# Patient Record
Sex: Male | Born: 1935 | Race: White | Hispanic: No | Marital: Married | Smoking: Never smoker
Health system: Southern US, Community
[De-identification: ages and names within clinical notes are randomized; demographics above are authoritative.]

## PROBLEM LIST (undated history)

## (undated) DIAGNOSIS — I499 Cardiac arrhythmia, unspecified: Secondary | ICD-10-CM

## (undated) DIAGNOSIS — G709 Myoneural disorder, unspecified: Secondary | ICD-10-CM

## (undated) DIAGNOSIS — H269 Unspecified cataract: Secondary | ICD-10-CM

## (undated) DIAGNOSIS — Z85828 Personal history of other malignant neoplasm of skin: Secondary | ICD-10-CM

## (undated) DIAGNOSIS — G4730 Sleep apnea, unspecified: Secondary | ICD-10-CM

## (undated) DIAGNOSIS — Z860100 Personal history of colon polyps, unspecified: Secondary | ICD-10-CM

## (undated) DIAGNOSIS — I10 Essential (primary) hypertension: Secondary | ICD-10-CM

## (undated) DIAGNOSIS — K219 Gastro-esophageal reflux disease without esophagitis: Secondary | ICD-10-CM

## (undated) DIAGNOSIS — I491 Atrial premature depolarization: Secondary | ICD-10-CM

## (undated) DIAGNOSIS — M1990 Unspecified osteoarthritis, unspecified site: Secondary | ICD-10-CM

## (undated) DIAGNOSIS — K635 Polyp of colon: Secondary | ICD-10-CM

## (undated) DIAGNOSIS — T7840XA Allergy, unspecified, initial encounter: Secondary | ICD-10-CM

## (undated) DIAGNOSIS — E785 Hyperlipidemia, unspecified: Secondary | ICD-10-CM

## (undated) DIAGNOSIS — C801 Malignant (primary) neoplasm, unspecified: Secondary | ICD-10-CM

## (undated) DIAGNOSIS — Z8546 Personal history of malignant neoplasm of prostate: Secondary | ICD-10-CM

## (undated) HISTORY — DX: Personal history of colonic polyps: Z86.010

## (undated) HISTORY — DX: Atrial premature depolarization: I49.1

## (undated) HISTORY — PX: ROTATOR CUFF REPAIR: SHX139

## (undated) HISTORY — DX: Sleep apnea, unspecified: G47.30

## (undated) HISTORY — DX: Personal history of malignant neoplasm of prostate: Z85.46

## (undated) HISTORY — PX: COLONOSCOPY: SHX174

## (undated) HISTORY — DX: Unspecified osteoarthritis, unspecified site: M19.90

## (undated) HISTORY — DX: Essential (primary) hypertension: I10

## (undated) HISTORY — DX: Gastro-esophageal reflux disease without esophagitis: K21.9

## (undated) HISTORY — DX: Polyp of colon: K63.5

## (undated) HISTORY — PX: PROSTATE SURGERY: SHX751

## (undated) HISTORY — PX: HEMORROIDECTOMY: SUR656

## (undated) HISTORY — DX: Personal history of colon polyps, unspecified: Z86.0100

## (undated) HISTORY — DX: Allergy, unspecified, initial encounter: T78.40XA

## (undated) HISTORY — DX: Hyperlipidemia, unspecified: E78.5

## (undated) HISTORY — PX: TOTAL KNEE ARTHROPLASTY: SHX125

## (undated) HISTORY — PX: COLONOSCOPY W/ POLYPECTOMY: SHX1380

## (undated) HISTORY — DX: Personal history of other malignant neoplasm of skin: Z85.828

## (undated) HISTORY — PX: INCONTINENCE SURGERY: SHX676

## (undated) HISTORY — DX: Malignant (primary) neoplasm, unspecified: C80.1

## (undated) HISTORY — DX: Unspecified cataract: H26.9

---

## 1995-04-05 HISTORY — PX: PROSTATECTOMY: SHX69

## 2003-07-21 ENCOUNTER — Emergency Department (HOSPITAL_COMMUNITY): Admission: EM | Admit: 2003-07-21 | Discharge: 2003-07-22 | Payer: Self-pay | Admitting: Emergency Medicine

## 2003-12-09 ENCOUNTER — Encounter: Admission: RE | Admit: 2003-12-09 | Discharge: 2003-12-09 | Payer: Self-pay | Admitting: Internal Medicine

## 2004-02-12 ENCOUNTER — Ambulatory Visit: Payer: Self-pay | Admitting: Internal Medicine

## 2004-02-20 ENCOUNTER — Encounter: Admission: RE | Admit: 2004-02-20 | Discharge: 2004-02-20 | Payer: Self-pay | Admitting: Internal Medicine

## 2004-04-30 ENCOUNTER — Ambulatory Visit: Payer: Self-pay | Admitting: Internal Medicine

## 2004-06-24 ENCOUNTER — Ambulatory Visit: Payer: Self-pay | Admitting: Internal Medicine

## 2004-07-13 ENCOUNTER — Ambulatory Visit: Payer: Self-pay | Admitting: Internal Medicine

## 2004-07-21 ENCOUNTER — Ambulatory Visit: Payer: Self-pay | Admitting: Gastroenterology

## 2004-07-27 ENCOUNTER — Ambulatory Visit: Payer: Self-pay | Admitting: *Deleted

## 2004-08-09 ENCOUNTER — Ambulatory Visit: Payer: Self-pay | Admitting: Gastroenterology

## 2005-02-16 ENCOUNTER — Ambulatory Visit: Payer: Self-pay | Admitting: Internal Medicine

## 2005-03-25 ENCOUNTER — Ambulatory Visit: Payer: Self-pay | Admitting: Internal Medicine

## 2005-06-24 ENCOUNTER — Ambulatory Visit: Payer: Self-pay | Admitting: Internal Medicine

## 2005-11-10 ENCOUNTER — Ambulatory Visit: Payer: Self-pay | Admitting: Internal Medicine

## 2006-01-24 ENCOUNTER — Ambulatory Visit: Payer: Self-pay | Admitting: Internal Medicine

## 2006-01-26 ENCOUNTER — Ambulatory Visit: Payer: Self-pay | Admitting: Gastroenterology

## 2006-02-07 ENCOUNTER — Ambulatory Visit: Payer: Self-pay | Admitting: Internal Medicine

## 2006-02-07 LAB — CONVERTED CEMR LAB
ALT: 31 units/L (ref 0–40)
AST: 30 units/L (ref 0–37)
Albumin: 4.1 g/dL (ref 3.5–5.2)
Alkaline Phosphatase: 60 units/L (ref 39–117)
BUN: 13 mg/dL (ref 6–23)
Basophils Absolute: 0 10*3/uL (ref 0.0–0.1)
Basophils Relative: 0.8 % (ref 0.0–1.0)
CO2: 31 meq/L (ref 19–32)
Calcium: 9.4 mg/dL (ref 8.4–10.5)
Chloride: 105 meq/L (ref 96–112)
Chol/HDL Ratio, serum: 3.2
Cholesterol: 209 mg/dL (ref 0–200)
Creatinine, Ser: 0.9 mg/dL (ref 0.4–1.5)
Eosinophil percent: 3.8 % (ref 0.0–5.0)
GFR calc non Af Amer: 89 mL/min
Glomerular Filtration Rate, Af Am: 108 mL/min/{1.73_m2}
Glucose, Bld: 101 mg/dL — ABNORMAL HIGH (ref 70–99)
HCT: 44.2 % (ref 39.0–52.0)
HDL: 65.8 mg/dL (ref >39.0)
Hemoglobin: 15 g/dL (ref 13.0–17.0)
LDL DIRECT: 129.1 mg/dL
Lymphocytes Relative: 27.4 % (ref 12.0–46.0)
MCHC: 34 g/dL (ref 30.0–36.0)
MCV: 89.8 fL (ref 78.0–100.0)
Monocytes Absolute: 0.3 10*3/uL (ref 0.2–0.7)
Monocytes Relative: 7 % (ref 3.0–11.0)
Neutro Abs: 2.9 10*3/uL (ref 1.4–7.7)
Neutrophils Relative %: 61 % (ref 43.0–77.0)
PSA: <0.03 ng/mL — ABNORMAL LOW (ref 0.10–4.00)
Platelets: 262 10*3/uL (ref 150–400)
Potassium: 4.4 meq/L (ref 3.5–5.1)
RBC: 4.92 M/uL (ref 4.22–5.81)
RDW: 12.5 % (ref 11.5–14.6)
Sodium: 142 meq/L (ref 135–145)
TSH: 1.13 microintl units/mL (ref 0.35–5.50)
Total Bilirubin: 1.2 mg/dL (ref 0.3–1.2)
Total Protein: 6.8 g/dL (ref 6.0–8.3)
Triglyceride fasting, serum: 72 mg/dL (ref 0–149)
VLDL: 14 mg/dL (ref 0–40)
WBC: 4.7 10*3/uL (ref 4.5–10.5)

## 2006-02-09 ENCOUNTER — Ambulatory Visit: Payer: Self-pay | Admitting: Gastroenterology

## 2006-02-09 ENCOUNTER — Encounter (INDEPENDENT_AMBULATORY_CARE_PROVIDER_SITE_OTHER): Payer: Self-pay | Admitting: Specialist

## 2006-02-16 ENCOUNTER — Ambulatory Visit: Payer: Self-pay | Admitting: Internal Medicine

## 2006-03-02 ENCOUNTER — Encounter: Payer: Self-pay | Admitting: Internal Medicine

## 2006-06-13 ENCOUNTER — Ambulatory Visit: Payer: Self-pay | Admitting: Internal Medicine

## 2006-06-13 LAB — CONVERTED CEMR LAB
ALT: 32 units/L (ref 0–40)
AST: 28 units/L (ref 0–37)
Cholesterol: 197 mg/dL (ref 0–200)
HDL: 57 mg/dL (ref >39.0)
Hgb A1c MFr Bld: 5.9 % (ref 4.6–6.0)
LDL Cholesterol: 117 mg/dL — ABNORMAL HIGH (ref 0–99)
Total CHOL/HDL Ratio: 3.5
Triglycerides: 114 mg/dL (ref 0–149)
VLDL: 23 mg/dL (ref 0–40)

## 2006-07-07 ENCOUNTER — Ambulatory Visit: Payer: Self-pay | Admitting: Internal Medicine

## 2006-08-09 DIAGNOSIS — M129 Arthropathy, unspecified: Secondary | ICD-10-CM | POA: Insufficient documentation

## 2006-08-10 ENCOUNTER — Encounter: Payer: Self-pay | Admitting: Internal Medicine

## 2006-08-10 ENCOUNTER — Ambulatory Visit: Payer: Self-pay | Admitting: Internal Medicine

## 2006-08-10 LAB — CONVERTED CEMR LAB
BUN: 13 mg/dL (ref 6–23)
Creatinine, Ser: 0.8 mg/dL (ref 0.4–1.5)
Potassium: 3.8 meq/L (ref 3.5–5.1)

## 2006-08-25 ENCOUNTER — Ambulatory Visit: Payer: Self-pay | Admitting: Internal Medicine

## 2006-08-25 ENCOUNTER — Encounter: Payer: Self-pay | Admitting: Internal Medicine

## 2006-08-25 DIAGNOSIS — K219 Gastro-esophageal reflux disease without esophagitis: Secondary | ICD-10-CM | POA: Insufficient documentation

## 2006-10-09 ENCOUNTER — Ambulatory Visit: Payer: Self-pay | Admitting: Internal Medicine

## 2006-10-20 ENCOUNTER — Encounter (INDEPENDENT_AMBULATORY_CARE_PROVIDER_SITE_OTHER): Payer: Self-pay | Admitting: *Deleted

## 2006-10-20 LAB — CONVERTED CEMR LAB
BUN: 12 mg/dL (ref 6–23)
Creatinine, Ser: 0.8 mg/dL (ref 0.4–1.5)
Creatinine,U: 103.4 mg/dL
Hgb A1c MFr Bld: 5.9 % (ref 4.6–6.0)
Microalb Creat Ratio: 3.9 mg/g (ref 0.0–30.0)
Microalb, Ur: 0.4 mg/dL (ref 0.0–1.9)
Potassium: 4.6 meq/L (ref 3.5–5.1)

## 2007-02-05 ENCOUNTER — Telehealth (INDEPENDENT_AMBULATORY_CARE_PROVIDER_SITE_OTHER): Payer: Self-pay | Admitting: *Deleted

## 2007-04-26 ENCOUNTER — Ambulatory Visit: Payer: Self-pay | Admitting: Internal Medicine

## 2007-04-26 ENCOUNTER — Encounter (INDEPENDENT_AMBULATORY_CARE_PROVIDER_SITE_OTHER): Payer: Self-pay | Admitting: Family Medicine

## 2007-04-26 DIAGNOSIS — I491 Atrial premature depolarization: Secondary | ICD-10-CM | POA: Insufficient documentation

## 2007-04-26 DIAGNOSIS — E782 Mixed hyperlipidemia: Secondary | ICD-10-CM | POA: Insufficient documentation

## 2007-04-26 DIAGNOSIS — Z8546 Personal history of malignant neoplasm of prostate: Secondary | ICD-10-CM | POA: Insufficient documentation

## 2007-04-26 DIAGNOSIS — I10 Essential (primary) hypertension: Secondary | ICD-10-CM | POA: Insufficient documentation

## 2007-04-26 LAB — CONVERTED CEMR LAB
Cholesterol, target level: 200 mg/dL
HDL goal, serum: 40 mg/dL
LDL Goal: 100 mg/dL

## 2007-04-28 LAB — CONVERTED CEMR LAB
ALT: 29 units/L (ref 0–53)
AST: 25 units/L (ref 0–37)
Albumin: 4.3 g/dL (ref 3.5–5.2)
Alkaline Phosphatase: 61 units/L (ref 39–117)
BUN: 11 mg/dL (ref 6–23)
Basophils Absolute: 0 10*3/uL (ref 0.0–0.1)
Basophils Relative: 0.7 % (ref 0.0–1.0)
Bilirubin, Direct: 0.2 mg/dL (ref 0.0–0.3)
CO2: 31 meq/L (ref 19–32)
Calcium: 9.5 mg/dL (ref 8.4–10.5)
Chloride: 102 meq/L (ref 96–112)
Cholesterol: 200 mg/dL (ref 0–200)
Creatinine, Ser: 0.8 mg/dL (ref 0.4–1.5)
Eosinophils Absolute: 0.3 10*3/uL (ref 0.0–0.6)
Eosinophils Relative: 5 % (ref 0.0–5.0)
GFR calc Af Amer: 123 mL/min
GFR calc non Af Amer: 102 mL/min
Glucose, Bld: 92 mg/dL (ref 70–99)
HCT: 42.6 % (ref 39.0–52.0)
HDL: 64.2 mg/dL (ref >39.0)
Hemoglobin: 15.1 g/dL (ref 13.0–17.0)
LDL Cholesterol: 114 mg/dL — ABNORMAL HIGH (ref 0–99)
Lymphocytes Relative: 24.1 % (ref 12.0–46.0)
MCHC: 35.4 g/dL (ref 30.0–36.0)
MCV: 87.6 fL (ref 78.0–100.0)
Monocytes Absolute: 0.4 10*3/uL (ref 0.2–0.7)
Monocytes Relative: 6.5 % (ref 3.0–11.0)
Neutro Abs: 3.5 10*3/uL (ref 1.4–7.7)
Neutrophils Relative %: 63.7 % (ref 43.0–77.0)
PSA: 0 ng/mL — ABNORMAL LOW (ref 0.10–4.00)
Platelets: 236 10*3/uL (ref 150–400)
Potassium: 5 meq/L (ref 3.5–5.1)
RBC: 4.86 M/uL (ref 4.22–5.81)
RDW: 12.6 % (ref 11.5–14.6)
Sodium: 139 meq/L (ref 135–145)
TSH: 1.49 microintl units/mL (ref 0.35–5.50)
Total Bilirubin: 1.3 mg/dL — ABNORMAL HIGH (ref 0.3–1.2)
Total CHOL/HDL Ratio: 3.1
Total Protein: 6.9 g/dL (ref 6.0–8.3)
Triglycerides: 111 mg/dL (ref 0–149)
VLDL: 22 mg/dL (ref 0–40)
WBC: 5.5 10*3/uL (ref 4.5–10.5)

## 2007-04-30 ENCOUNTER — Encounter (INDEPENDENT_AMBULATORY_CARE_PROVIDER_SITE_OTHER): Payer: Self-pay | Admitting: *Deleted

## 2007-05-01 ENCOUNTER — Ambulatory Visit: Payer: Self-pay | Admitting: Internal Medicine

## 2007-05-01 ENCOUNTER — Encounter (INDEPENDENT_AMBULATORY_CARE_PROVIDER_SITE_OTHER): Payer: Self-pay | Admitting: *Deleted

## 2007-06-07 ENCOUNTER — Encounter: Payer: Self-pay | Admitting: Internal Medicine

## 2007-10-24 ENCOUNTER — Telehealth (INDEPENDENT_AMBULATORY_CARE_PROVIDER_SITE_OTHER): Payer: Self-pay | Admitting: *Deleted

## 2008-01-08 ENCOUNTER — Telehealth (INDEPENDENT_AMBULATORY_CARE_PROVIDER_SITE_OTHER): Payer: Self-pay | Admitting: *Deleted

## 2008-04-28 ENCOUNTER — Ambulatory Visit: Payer: Self-pay | Admitting: Internal Medicine

## 2008-04-28 DIAGNOSIS — Z85828 Personal history of other malignant neoplasm of skin: Secondary | ICD-10-CM | POA: Insufficient documentation

## 2008-04-28 DIAGNOSIS — R32 Unspecified urinary incontinence: Secondary | ICD-10-CM | POA: Insufficient documentation

## 2008-04-30 ENCOUNTER — Ambulatory Visit: Payer: Self-pay | Admitting: Cardiology

## 2008-05-02 ENCOUNTER — Ambulatory Visit: Payer: Self-pay | Admitting: Internal Medicine

## 2008-05-02 LAB — CONVERTED CEMR LAB
OCCULT 1: NEGATIVE
OCCULT 2: NEGATIVE
OCCULT 3: NEGATIVE

## 2008-05-06 ENCOUNTER — Encounter: Payer: Self-pay | Admitting: Internal Medicine

## 2008-05-09 ENCOUNTER — Encounter: Payer: Self-pay | Admitting: Internal Medicine

## 2008-06-19 ENCOUNTER — Ambulatory Visit: Payer: Self-pay | Admitting: Internal Medicine

## 2009-03-02 ENCOUNTER — Encounter: Payer: Self-pay | Admitting: Internal Medicine

## 2009-04-29 ENCOUNTER — Ambulatory Visit: Payer: Self-pay | Admitting: Internal Medicine

## 2009-05-05 ENCOUNTER — Telehealth (INDEPENDENT_AMBULATORY_CARE_PROVIDER_SITE_OTHER): Payer: Self-pay | Admitting: *Deleted

## 2009-05-06 ENCOUNTER — Ambulatory Visit: Payer: Self-pay | Admitting: Internal Medicine

## 2009-05-06 LAB — CONVERTED CEMR LAB
OCCULT 1: NEGATIVE
OCCULT 2: NEGATIVE
OCCULT 3: NEGATIVE

## 2009-05-07 ENCOUNTER — Encounter (INDEPENDENT_AMBULATORY_CARE_PROVIDER_SITE_OTHER): Payer: Self-pay | Admitting: *Deleted

## 2010-01-20 ENCOUNTER — Telehealth (INDEPENDENT_AMBULATORY_CARE_PROVIDER_SITE_OTHER): Payer: Self-pay | Admitting: *Deleted

## 2010-04-08 ENCOUNTER — Telehealth: Payer: Self-pay | Admitting: Cardiology

## 2010-04-08 ENCOUNTER — Telehealth: Payer: Self-pay | Admitting: Internal Medicine

## 2010-04-08 ENCOUNTER — Ambulatory Visit
Admission: RE | Admit: 2010-04-08 | Discharge: 2010-04-08 | Payer: Self-pay | Source: Home / Self Care | Attending: Internal Medicine | Admitting: Internal Medicine

## 2010-04-08 DIAGNOSIS — M5412 Radiculopathy, cervical region: Secondary | ICD-10-CM | POA: Insufficient documentation

## 2010-04-08 DIAGNOSIS — G5600 Carpal tunnel syndrome, unspecified upper limb: Secondary | ICD-10-CM | POA: Insufficient documentation

## 2010-04-30 ENCOUNTER — Ambulatory Visit
Admission: RE | Admit: 2010-04-30 | Discharge: 2010-04-30 | Payer: Self-pay | Source: Home / Self Care | Attending: Internal Medicine | Admitting: Internal Medicine

## 2010-04-30 ENCOUNTER — Other Ambulatory Visit: Payer: Self-pay | Admitting: Internal Medicine

## 2010-04-30 ENCOUNTER — Encounter: Payer: Self-pay | Admitting: Internal Medicine

## 2010-04-30 DIAGNOSIS — Z860100 Personal history of colon polyps, unspecified: Secondary | ICD-10-CM | POA: Insufficient documentation

## 2010-04-30 DIAGNOSIS — J069 Acute upper respiratory infection, unspecified: Secondary | ICD-10-CM | POA: Insufficient documentation

## 2010-04-30 LAB — HEPATIC FUNCTION PANEL
ALT: 32 U/L (ref 0–53)
AST: 33 U/L (ref 0–37)
Albumin: 4.2 g/dL (ref 3.5–5.2)
Alkaline Phosphatase: 60 U/L (ref 39–117)
Bilirubin, Direct: 0.1 mg/dL (ref 0.0–0.3)
Total Bilirubin: 1.1 mg/dL (ref 0.3–1.2)
Total Protein: 6.8 g/dL (ref 6.0–8.3)

## 2010-04-30 LAB — CBC WITH DIFFERENTIAL/PLATELET
Basophils Absolute: 0 10*3/uL (ref 0.0–0.1)
Basophils Relative: 0.4 % (ref 0.0–3.0)
Eosinophils Absolute: 0.3 10*3/uL (ref 0.0–0.7)
Eosinophils Relative: 4.3 % (ref 0.0–5.0)
HCT: 41.1 % (ref 39.0–52.0)
Hemoglobin: 14.4 g/dL (ref 13.0–17.0)
Lymphocytes Relative: 24.8 % (ref 12.0–46.0)
Lymphs Abs: 1.6 10*3/uL (ref 0.7–4.0)
MCHC: 35 g/dL (ref 30.0–36.0)
MCV: 89.7 fl (ref 78.0–100.0)
Monocytes Absolute: 0.5 10*3/uL (ref 0.1–1.0)
Monocytes Relative: 7.2 % (ref 3.0–12.0)
Neutro Abs: 4.1 10*3/uL (ref 1.4–7.7)
Neutrophils Relative %: 63.3 % (ref 43.0–77.0)
Platelets: 267 10*3/uL (ref 150.0–400.0)
RBC: 4.59 Mil/uL (ref 4.22–5.81)
RDW: 13.6 % (ref 11.5–14.6)
WBC: 6.5 10*3/uL (ref 4.5–10.5)

## 2010-04-30 LAB — CONVERTED CEMR LAB
Bilirubin Urine: NEGATIVE
Glucose, Urine, Semiquant: NEGATIVE
Ketones, urine, test strip: NEGATIVE
Nitrite: NEGATIVE
Protein, U semiquant: NEGATIVE
Specific Gravity, Urine: <1.005
Urobilinogen, UA: NEGATIVE
WBC Urine, dipstick: NEGATIVE
pH: 5.5

## 2010-04-30 LAB — TSH: TSH: 1.67 u[IU]/mL (ref 0.35–5.50)

## 2010-04-30 LAB — BASIC METABOLIC PANEL
BUN: 17 mg/dL (ref 6–23)
CO2: 27 mEq/L (ref 19–32)
Calcium: 9.2 mg/dL (ref 8.4–10.5)
Chloride: 106 mEq/L (ref 96–112)
Creatinine, Ser: 0.7 mg/dL (ref 0.4–1.5)
GFR: 111.64 mL/min (ref >60.00)
Glucose, Bld: 87 mg/dL (ref 70–99)
Potassium: 4.2 mEq/L (ref 3.5–5.1)
Sodium: 141 mEq/L (ref 135–145)

## 2010-04-30 LAB — LIPID PANEL
Cholesterol: 209 mg/dL — ABNORMAL HIGH (ref 0–200)
HDL: 66.1 mg/dL (ref >39.00)
Total CHOL/HDL Ratio: 3
Triglycerides: 119 mg/dL (ref 0.0–149.0)
VLDL: 23.8 mg/dL (ref 0.0–40.0)

## 2010-04-30 LAB — LDL CHOLESTEROL, DIRECT: Direct LDL: 132.3 mg/dL

## 2010-04-30 LAB — PSA: PSA: 0 ng/mL — ABNORMAL LOW (ref 0.10–4.00)

## 2010-05-02 LAB — CONVERTED CEMR LAB
ALT: 25 units/L (ref 0–53)
ALT: 42 units/L (ref 0–53)
AST: 24 units/L (ref 0–37)
AST: 35 units/L (ref 0–37)
Albumin: 4.3 g/dL (ref 3.5–5.2)
Albumin: 4.3 g/dL (ref 3.5–5.2)
Alkaline Phosphatase: 62 units/L (ref 39–117)
Alkaline Phosphatase: 64 units/L (ref 39–117)
BUN: 14 mg/dL (ref 6–23)
BUN: 15 mg/dL (ref 6–23)
Basophils Absolute: 0 10*3/uL (ref 0.0–0.1)
Basophils Absolute: 0 10*3/uL (ref 0.0–0.1)
Basophils Relative: 0.3 % (ref 0.0–3.0)
Basophils Relative: 0.3 % (ref 0.0–3.0)
Bilirubin, Direct: 0.1 mg/dL (ref 0.0–0.3)
Bilirubin, Direct: 0.2 mg/dL (ref 0.0–0.3)
CO2: 30 meq/L (ref 19–32)
CO2: 31 meq/L (ref 19–32)
Calcium: 9.1 mg/dL (ref 8.4–10.5)
Calcium: 9.2 mg/dL (ref 8.4–10.5)
Chloride: 105 meq/L (ref 96–112)
Chloride: 105 meq/L (ref 96–112)
Cholesterol: 178 mg/dL (ref 0–200)
Cholesterol: 195 mg/dL (ref 0–200)
Creatinine, Ser: 0.8 mg/dL (ref 0.4–1.5)
Creatinine, Ser: 0.8 mg/dL (ref 0.4–1.5)
Eosinophils Absolute: 0.2 10*3/uL (ref 0.0–0.7)
Eosinophils Absolute: 0.3 10*3/uL (ref 0.0–0.7)
Eosinophils Relative: 4 % (ref 0.0–5.0)
Eosinophils Relative: 4.1 % (ref 0.0–5.0)
GFR calc Af Amer: 123 mL/min
GFR calc non Af Amer: 100.73 mL/min (ref >60)
GFR calc non Af Amer: 101 mL/min
Glucose, Bld: 78 mg/dL (ref 70–99)
Glucose, Bld: 94 mg/dL (ref 70–99)
HCT: 43 % (ref 39.0–52.0)
HCT: 44.9 % (ref 39.0–52.0)
HDL: 62.1 mg/dL (ref >39.00)
HDL: 64.1 mg/dL (ref >39.0)
Hemoglobin: 14.5 g/dL (ref 13.0–17.0)
Hemoglobin: 15.6 g/dL (ref 13.0–17.0)
LDL Cholesterol: 100 mg/dL — ABNORMAL HIGH (ref 0–99)
LDL Cholesterol: 110 mg/dL — ABNORMAL HIGH (ref 0–99)
LDL Goal: 130 mg/dL
Lymphocytes Relative: 19.3 % (ref 12.0–46.0)
Lymphocytes Relative: 29.8 % (ref 12.0–46.0)
Lymphs Abs: 1.5 10*3/uL (ref 0.7–4.0)
MCHC: 33.7 g/dL (ref 30.0–36.0)
MCHC: 34.7 g/dL (ref 30.0–36.0)
MCV: 89.3 fL (ref 78.0–100.0)
MCV: 91.9 fL (ref 78.0–100.0)
Monocytes Absolute: 0.3 10*3/uL (ref 0.1–1.0)
Monocytes Absolute: 0.4 10*3/uL (ref 0.1–1.0)
Monocytes Relative: 6 % (ref 3.0–12.0)
Monocytes Relative: 6.2 % (ref 3.0–12.0)
Neutro Abs: 3.1 10*3/uL (ref 1.4–7.7)
Neutro Abs: 4.5 10*3/uL (ref 1.4–7.7)
Neutrophils Relative %: 59.6 % (ref 43.0–77.0)
Neutrophils Relative %: 70.4 % (ref 43.0–77.0)
PSA: 0 ng/mL — ABNORMAL LOW (ref 0.10–4.00)
Platelets: 228 10*3/uL (ref 150.0–400.0)
Platelets: 240 10*3/uL (ref 150–400)
Potassium: 4.1 meq/L (ref 3.5–5.1)
Potassium: 4.6 meq/L (ref 3.5–5.1)
RBC: 4.68 M/uL (ref 4.22–5.81)
RBC: 5.02 M/uL (ref 4.22–5.81)
RDW: 12.7 % (ref 11.5–14.6)
RDW: 13.2 % (ref 11.5–14.6)
Sodium: 142 meq/L (ref 135–145)
Sodium: 142 meq/L (ref 135–145)
TSH: 1.58 microintl units/mL (ref 0.35–5.50)
TSH: 1.65 microintl units/mL (ref 0.35–5.50)
Total Bilirubin: 1.2 mg/dL (ref 0.3–1.2)
Total Bilirubin: 1.2 mg/dL (ref 0.3–1.2)
Total CHOL/HDL Ratio: 3
Total CHOL/HDL Ratio: 3
Total Protein: 6.8 g/dL (ref 6.0–8.3)
Total Protein: 7 g/dL (ref 6.0–8.3)
Triglycerides: 105 mg/dL (ref 0–149)
Triglycerides: 81 mg/dL (ref 0.0–149.0)
VLDL: 16.2 mg/dL (ref 0.0–40.0)
VLDL: 21 mg/dL (ref 0–40)
WBC: 5.1 10*3/uL (ref 4.5–10.5)
WBC: 6.5 10*3/uL (ref 4.5–10.5)

## 2010-05-04 NOTE — Progress Notes (Signed)
Summary: CPX and surgery  Phone Note Call from Patient Call back at Home Phone [phone]   Summary of Call: Patient called the office stating that someone called him. I tried to establish what the pt needed and as of this time he does not need anything. Patient is currently scheduled for a CPX on 04/29/10. He will leave his cpx as scheduled and try to get his surgery moved to the first of Feb. Initial call taken by: Lucious Groves CMA,  January 20, 2010 2:41 PM  Follow-up for Phone Call        I dont show any phone message placed to this patient, therefore I will close out this phone note Follow-up by: Jerolyn Shin,  January 26, 2010 10:44 AM

## 2010-05-04 NOTE — Progress Notes (Signed)
Summary: sinus infection want antibiotics  Phone Note Call from Patient   Caller: Patient Summary of Call: pt left VM that he thinks that he may have a sinus infection and would like to have med called in to [facility]. called pt back informed pt that he would need to been seen for a antibiotics to be rx. pt states that he will continue to use neti pot and if his symptoms increase he will call for OV.............Marland KitchenFelecia Deloach CMA  May 05, 2009 1:16 PM
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
## 2010-05-04 NOTE — Letter (Signed)
Summary: Results Follow up Letter  [facility]  [address] [place], [place] [address]   Phone: [phone]  Fax: [phone]    05/07/2009 MRN: [id number]  [name] [address] [place], [place]  [address]  Dear [name],  The following are the results of your recent test(s):  Test         Result    Pap Smear:        Normal _____  Not Normal _____ Comments: ______________________________________________________ Cholesterol: LDL(Bad cholesterol):         Your goal is less than:         HDL (Good cholesterol):       Your goal is more than: Comments:  ______________________________________________________ Mammogram:        Normal _____  Not Normal _____ Comments:  ___________________________________________________________________ Hemoccult:        Normal _X____  Not normal _______ Comments:    _____________________________________________________________________ Other Tests:    We routinely do not discuss normal results over the telephone.  If you desire a copy of the results, or you have any questions about this information we can discuss them at your next office visit.   Sincerely,

## 2010-05-06 ENCOUNTER — Encounter (HOSPITAL_COMMUNITY): Payer: Medicare Other | Attending: Orthopedic Surgery

## 2010-05-06 LAB — COMPREHENSIVE METABOLIC PANEL
AST: 28 U/L (ref 0–37)
Albumin: 4.1 g/dL (ref 3.5–5.2)
BUN: 14 mg/dL (ref 6–23)
CO2: 28 mEq/L (ref 19–32)
Calcium: 9.1 mg/dL (ref 8.4–10.5)
Chloride: 103 mEq/L (ref 96–112)
Creatinine, Ser: 0.83 mg/dL (ref 0.4–1.5)
GFR calc Af Amer: >60 mL/min (ref >60)
GFR calc non Af Amer: >60 mL/min (ref >60)
Total Bilirubin: 0.9 mg/dL (ref 0.3–1.2)

## 2010-05-06 LAB — CBC
HCT: 41.6 % (ref 39.0–52.0)
Hemoglobin: 14.2 g/dL (ref 13.0–17.0)
MCH: 30.1 pg (ref 26.0–34.0)
MCHC: 34.1 g/dL (ref 30.0–36.0)
MCV: 88.1 fL (ref 78.0–100.0)
Platelets: 243 10*3/uL (ref 150–400)
RBC: 4.72 MIL/uL (ref 4.22–5.81)
RDW: 13.3 % (ref 11.5–15.5)
WBC: 7.4 10*3/uL (ref 4.0–10.5)

## 2010-05-06 LAB — PROTIME-INR
INR: 0.97 (ref 0.00–1.49)
Prothrombin Time: 13.1 seconds (ref 11.6–15.2)

## 2010-05-06 LAB — URINALYSIS, ROUTINE W REFLEX MICROSCOPIC
Bilirubin Urine: NEGATIVE
Hgb urine dipstick: NEGATIVE
Ketones, ur: NEGATIVE mg/dL
Nitrite: NEGATIVE
Protein, ur: NEGATIVE mg/dL
Specific Gravity, Urine: 1.019 (ref 1.005–1.030)
Urine Glucose, Fasting: NEGATIVE mg/dL
Urobilinogen, UA: 0.2 mg/dL (ref 0.0–1.0)
pH: 5 (ref 5.0–8.0)

## 2010-05-06 LAB — SURGICAL PCR SCREEN
MRSA, PCR: NEGATIVE
Staphylococcus aureus: NEGATIVE

## 2010-05-06 LAB — APTT: aPTT: 28 seconds (ref 24–37)
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
## 2010-05-06 NOTE — Assessment & Plan Note (Signed)
Summary: right arm   Vital Signs:  Patient profile:   76 year old male Weight:      194.8 pounds BMI:     28.05 Temp:     98.5 degrees F oral Pulse rate:   56 / minute Resp:     15 per minute BP sitting:   130 / 80  (left arm) Cuff size:   large  Vitals Entered By: Shonna Chock CMA (April 08, 2010 4:37 PM) CC: Right arm concerns since yesterday , Shoulder pain   CC:  Right arm concerns since yesterday  and Shoulder pain.  History of Present Illness:      This is a 76 year old man who presents with Shoulder pain; onset while reclining watching tv last night. His neck was flexed for 2 hrs. The patient reports numbness and tingling in entire RUE ( no definite nerve root distribution) last night in bed, but denies weakness, locking, stiffness, impaired ROM, swelling, and redness.  The pain is located in the right shoulder.  The pain began gradually and  was ? related to using machines @ gym 01/03.  The patient describes the pain as constant and aching.  The pain  was no  better with ASA, it has improved through day today. PMH of Rotator Cuff surgery on both shoulders.He is on Celebrex once daily for knee pain; TKR planned by Dr Despina Hick 05/17/2010.  Current Medications (verified): 1)  Clarinex 5 Mg  Tabs (Desloratadine) .... As Needed 2)  Clonazepam 0.5 Mg  Tabs (Clonazepam) .Marland Kitchen.. 1 Tab Once Daily Prn 3)  Lipitor 20 Mg  Tabs (Atorvastatin Calcium) .... Take  1 1/2  Tablets  Daily 4)  Skelaxin 800 Mg  Tabs (Metaxalone) .... As Needed 5)  Metoprolol Tartrate 50 Mg  Tabs (Metoprolol Tartrate) .... Take One Half Tablet Twice Daily 6)  Celebrex 200 Mg  Caps (Celecoxib) .... Take One Tablet Daily 7)  Amlodipine Besylate 10 Mg  Tabs (Amlodipine Besylate) .Marland Kitchen.. 1 Once Daily 8)  Benazepril Hcl 40 Mg  Tabs (Benazepril Hcl) .Marland Kitchen.. 1 By Mouth Qd 9)  Prilosec Otc 20 Mg Tbec (Omeprazole Magnesium) .Marland Kitchen.. 1 Q Am As Needed  Allergies: 1)  ! * Topical Cains  Physical Exam  Neck:  No deformities, masses,  or tenderness noted. Full ROM Msk:  No deformity or scoliosis noted of thoracic or lumbar spine.  Minimal lordosis upper spine. Slight asymmetry of thoracic muscles, R> L Extremities:  Minimally decreased ROM R shoulder; crepitus L shoulder > R Cervical Nodes:  No lymphadenopathy noted Axillary Nodes:  No palpable lymphadenopathy Psych:  memory intact for recent and remote, normally interactive, and good eye contact.     Shoulder/Elbow Exam  General:    Well-developed, well-nourished, normal body habitus; no deformities, normal grooming.    Skin:    Intact, no scars, lesions, rashes, cafe au lait spots or bruising.    Inspection:    Inspection is normal.    Palpation:    Non-tender to palpation bilaterally.    Vascular:    Radial  pulses 2+ and symmetric; ; no evidence of ischemia, clubbing, or cyanosis.    Sensory:    Gross sensation intact in the upper extremities.    Motor:    Normal strength in the upper extremities.    Reflexes:     1/2+  reflexes in the upper extremities.  Negative Tinel's    Impression & Recommendations:  Problem # 1:  CERVICAL RADICULOPATHY, RIGHT (ICD-723.4) probably  due to protracted neck flexion while watching  tv  Problem # 2:  CARPAL TUNNEL SYNDROME, RIGHT, MILD (ICD-354.0) from wrist positioning while asleep  Complete Medication List: 1)  Clarinex 5 Mg Tabs (Desloratadine) .... As needed 2)  Clonazepam 0.5 Mg Tabs (Clonazepam) .Marland Kitchen.. 1 tab once daily prn 3)  Lipitor 20 Mg Tabs (Atorvastatin calcium) .... Take  1 1/2  tablets  daily 4)  Skelaxin 800 Mg Tabs (Metaxalone) .... As needed 5)  Metoprolol Tartrate 50 Mg Tabs (Metoprolol tartrate) .... Take one half tablet twice daily 6)  Celebrex 200 Mg Caps (Celecoxib) .... Take one tablet daily 7)  Amlodipine Besylate 10 Mg Tabs (Amlodipine besylate) .Marland Kitchen.. 1 once daily 8)  Benazepril Hcl 40 Mg Tabs (Benazepril hcl) .Marland Kitchen.. 1 by mouth qd 9)  Prilosec Otc 20 Mg Tbec (Omeprazole magnesium) .Marland Kitchen.. 1  q am as needed  Patient Instructions: 1)  Avoid neck flexion; consider a cervical pillow. Wrist splint if numbness recurs. Skelaxin as needed as Rxed.   Orders Added: 1)  Est. Patient Level IV [47829]

## 2010-05-06 NOTE — Progress Notes (Signed)
Summary: Right arm throbbing  Phone Note Call from Patient   Summary of Call: Pt called today requesting appt for issue he is having with his right arm. He notes that he has been having tingling and throbbing of the right arm only. He denies chest pain, SOB, fever, and swelling. Patient did not want to go to ER or UC, so he will come here this afternoon. Lucious Groves CMA  April 08, 2010 9:06 AM

## 2010-05-06 NOTE — Progress Notes (Signed)
Summary: throbbing in right arm-wants to come in now  Phone Note Call from Patient   Caller: Patient [phone]   Reason for Call: Talk to Nurse Summary of Call: pt having throbbing in right arm since last night-denies any other symptoms-wants to come in right now Initial call taken by: Glynda Jaeger,  April 08, 2010 8:06 AM  Follow-up for Phone Call        lmom for pt that he needs to call Dr Caryl Never office  hard to say what the throbbing pain is in right arm.  pt has not been seen by Dr Myrtis Ser in almost two years.  i think he should start with his primary care doctor first.   Dennis Bast, RN, BSN  April 08, 2010 8:59 AM   Additional Follow-up for Phone Call Additional follow up Details #1::        Agree   Talitha Givens, MD, [facility]  April 08, 2010 9:03 AM

## 2010-05-12 ENCOUNTER — Encounter (INDEPENDENT_AMBULATORY_CARE_PROVIDER_SITE_OTHER): Payer: Self-pay | Admitting: *Deleted

## 2010-05-12 ENCOUNTER — Other Ambulatory Visit (INDEPENDENT_AMBULATORY_CARE_PROVIDER_SITE_OTHER): Payer: Medicare Other

## 2010-05-12 ENCOUNTER — Encounter: Payer: Self-pay | Admitting: Internal Medicine

## 2010-05-12 DIAGNOSIS — Z1211 Encounter for screening for malignant neoplasm of colon: Secondary | ICD-10-CM

## 2010-05-12 LAB — CONVERTED CEMR LAB
OCCULT 1: NEGATIVE
OCCULT 2: NEGATIVE
OCCULT 3: NEGATIVE

## 2010-05-17 ENCOUNTER — Inpatient Hospital Stay (HOSPITAL_COMMUNITY)
Admission: RE | Admit: 2010-05-17 | Discharge: 2010-05-20 | DRG: 470 | Disposition: A | Discharge status: Home Health | Payer: Medicare Other | Attending: Orthopedic Surgery | Admitting: Orthopedic Surgery

## 2010-05-17 DIAGNOSIS — M1710 Unilateral primary osteoarthritis, unspecified knee: Principal | ICD-10-CM | POA: Diagnosis present

## 2010-05-17 DIAGNOSIS — Z8546 Personal history of malignant neoplasm of prostate: Secondary | ICD-10-CM

## 2010-05-17 DIAGNOSIS — E7800 Pure hypercholesterolemia, unspecified: Secondary | ICD-10-CM | POA: Diagnosis present

## 2010-05-17 DIAGNOSIS — G4733 Obstructive sleep apnea (adult) (pediatric): Secondary | ICD-10-CM | POA: Diagnosis present

## 2010-05-17 DIAGNOSIS — I10 Essential (primary) hypertension: Secondary | ICD-10-CM | POA: Diagnosis present

## 2010-05-17 DIAGNOSIS — I491 Atrial premature depolarization: Secondary | ICD-10-CM | POA: Diagnosis present

## 2010-05-17 DIAGNOSIS — M21869 Other specified acquired deformities of unspecified lower leg: Secondary | ICD-10-CM | POA: Diagnosis present

## 2010-05-17 LAB — TYPE AND SCREEN: Antibody Screen: NEGATIVE

## 2010-05-17 LAB — ABO/RH: ABO/RH(D): O POS

## 2010-05-18 LAB — CBC
Hemoglobin: 11.9 g/dL — ABNORMAL LOW (ref 13.0–17.0)
MCH: 30 pg (ref 26.0–34.0)
MCHC: 34.4 g/dL (ref 30.0–36.0)
Platelets: 213 10*3/uL (ref 150–400)

## 2010-05-18 LAB — BASIC METABOLIC PANEL
CO2: 30 mEq/L (ref 19–32)
Calcium: 8.4 mg/dL (ref 8.4–10.5)
Creatinine, Ser: 0.79 mg/dL (ref 0.4–1.5)
GFR calc Af Amer: >60 mL/min (ref >60)
GFR calc non Af Amer: >60 mL/min (ref >60)
Sodium: 139 mEq/L (ref 135–145)

## 2010-05-19 LAB — BASIC METABOLIC PANEL
CO2: 29 mEq/L (ref 19–32)
Chloride: 105 mEq/L (ref 96–112)
Creatinine, Ser: 0.82 mg/dL (ref 0.4–1.5)
GFR calc Af Amer: >60 mL/min (ref >60)
Potassium: 3.8 mEq/L (ref 3.5–5.1)
Sodium: 140 mEq/L (ref 135–145)

## 2010-05-19 LAB — CBC
Hemoglobin: 10.6 g/dL — ABNORMAL LOW (ref 13.0–17.0)
Platelets: 194 10*3/uL (ref 150–400)
RBC: 3.57 MIL/uL — ABNORMAL LOW (ref 4.22–5.81)
WBC: 9 10*3/uL (ref 4.0–10.5)

## 2010-05-19 NOTE — H&P (Signed)
NAMENECO, KLING NO.:  [id number]  MEDICAL RECORD NO.:  [id number]           PATIENT TYPE:  LOCATION:                                 FACILITY:  PHYSICIAN:  Ollen Gross, M.D.    DATE OF BIRTH:  November 05, 1934  DATE OF ADMISSION:  05/17/2010 DATE OF DISCHARGE:                             HISTORY & PHYSICAL   DATE OF OFFICE VISIT AND HISTORY AND PHYSICAL:  April 13, 2010.  DATE OF ADMISSION:  May 17, 2010  CHIEF COMPLAINT:  Left knee pain.  HISTORY OF PRESENT ILLNESS:  The patient is a 75-year male who has seen by Dr. Lequita Halt for ongoing left knee pain.  He has originally been a patient of Dr. Hayden Rasmussen and has been treated for his knee for several years now.  He was noted to have progressive arthritis.  X-rays showed essentially bone-on-bone in the medial compartment as well as slight valgus malalignment deformity.  Also has some patellofemoral involvement.  He has undergone injections in the past with only minimal benefit.  He would like to get back to being active and playing golf, would like to go ahead and have his knee replaced in order to resume his active lifestyle.  ALLERGIES:  No known drug allergies.  CURRENT MEDICATIONS:  Lipitor 30 mg daily, benazepril 40 mg daily, metoprolol 50 mg 1/2 tablet twice a day, amlodipine 10 mg daily.  PAST MEDICAL HISTORY:  History of shingles, mild sleep apnea, hypertension, hyperlipidemia, history of PACs, hemorrhoids, gastroesophageal reflux disease, history of prostate cancer, urinary incontinence, history of skin cancer, osteoarthritis.  Childhood illnesses of measles and mumps.  PAST SURGICAL HISTORY:  Prostate surgery in 1997, left shoulder surgery, right shoulder surgery, left knee scope.  FAMILY HISTORY:  Father deceased at age 92.  Mother with history of stroke.  SOCIAL HISTORY:  Married.  Retired from [employer] and also [employer]. Nonsmoker, 2 to 3 ounces of alcohol daily.  Has 3 children.   He does have a caregiver lined up.  Lives in [facility].  He has a living will.  REVIEW OF SYSTEMS:  GENERAL:  No fevers, chills, or night sweats. NEURO:  No seizure, syncope, paralysis.  RESPIRATORY: No shortness breath, productive cough, or hemoptysis.  CARDIOVASCULAR:  No chest pain, no orthopnea.  GI:  No nausea, vomiting, diarrhea, constipation. Little bit of reflux.  GU:  Little bit of frequency, nocturia and some slight incontinence.  MUSCULOSKELETAL:  Knee pain.  PHYSICAL EXAMINATION:  VITAL SIGNS:  Pulse 64 which is irregular, respirations 12, blood pressure 117/70. GENERAL:  A 76 year old male, well nourished, well developed, no acute distress.  He is alert, oriented, cooperative, good historian. HEENT:  Normocephalic, atraumatic.  Pupils are round and reactive.  EOMs intact. NECK:  Supple. CHEST:  Clear. HEART:  Irregular rhythm (PACs).  S1 and S2 noted.  He has a very faint systolic ejection murmur best heard over aortic, slightly more than pulmonic. ABDOMEN:  Soft, nontender.  Bowel sounds present. RECTAL:  Not done, not pertinent to present illness. BREASTS:  Not done, not pertinent to present illness.  GENITALIA:  Not done, not pertinent to present illness. EXTREMITIES:  Left knee, no effusion, slight varus.  Range of motion 5 to 120.  Moderate-to-marked crepitus.  IMPRESSION:  Osteoarthritis, left knee.  PLAN:  The patient admitted to [facility] to undergo a left total knee replacement arthroplasty.  Surgery will be performed by Dr. Ollen Gross.     Alexzandrew L. Julien Girt, P.A.C.   ______________________________ Ollen Gross, M.D.    ALP/MEDQ  D:  05/16/2010  T:  05/17/2010  Job:  [id number]  cc:   Titus Dubin. Alwyn Ren, MD,FACP,FCCP [phone] [place] [address]  Luis Abed, MD, [facility] [address]  [address] [place] [address]  Electronically Signed by Patrica Duel P.A.C. on 05/19/2010 11:22:46  AM Electronically Signed by Ollen Gross M.D. on 05/19/2010 02:55:16 PM

## 2010-05-19 NOTE — Op Note (Signed)
[name], MCKENZIE NO.:  [id number]  MEDICAL RECORD NO.:  [id number]           PATIENT TYPE:  I  LOCATION:  0003                         FACILITY:  [facility]  PHYSICIAN:  Ollen Gross, M.D.    DATE OF BIRTH:  [birth year]  DATE OF PROCEDURE:  05/17/2010 DATE OF DISCHARGE:                              OPERATIVE REPORT   PREOPERATIVE DIAGNOSIS:  Osteoarthritis, left knee.  POSTOPERATIVE DIAGNOSIS:  Osteoarthritis, left knee.  PROCEDURE:  Left total knee arthroplasty.  SURGEON:  Ollen Gross, M.D.  ASSISTANT:  Rozell Searing, PA-C  ANESTHESIA:  General.  ESTIMATED BLOOD LOSS:  Minimal.  DRAIN:  Hemovac x1.  TOURNIQUET TIME:  39 minutes at 300 mmHg.  COMPLICATIONS:  None.  CONDITION:  Stable to recovery.  CLINICAL NOTE:  [name] is a 76 year old male with end-stage arthritis of left knee with progressively worsening pain and dysfunction.  He has failed nonoperative management, presents for left total knee arthroplasty.  PROCEDURE IN DETAIL:  After successful initiation of general anesthetic, a tourniquet was placed on his left thigh and his left lower extremity was prepped and draped in the usual sterile fashion.  Extremities were wrapped in Esmarch, knee flexed, tourniquet inflated to 300 mmHg. Midline incision was made with a #10 blade through subcutaneous tissue to the level of the extensor mechanism.  Fresh blade was used make a medial parapatellar arthrotomy.  Soft tissue on the proximal medial tibia subperiosteally elevated to the joint line with the knife into the semimembranosus bursa with Cobb elevator.  Soft tissue laterally was elevated with attention being paid to avoid any patellar tendon on tibial tubercle.  Patella was everted, knee flexed 90 degrees, ACL and PCL removed.  Drill was used create a starting hole in the distal femur and canal was thoroughly irrigated.  A 5-degree left valgus alignment guide was placed and the distal  femoral cutting block was pinned to remove 11 mm off the distal femur.  Distal femoral resection was made with an oscillating saw.  Sizing guide was placed, size 5 was the most appropriate.  Rotations marked at the epicondylar axis.  Size 5 cutting guide was placed and the anterior and posterior chamfer cuts were made.  The tibia subluxed forward and the menisci removed.  Extramedullary tibial alignment guide was placed referencing proximally at the medial aspect of the tibial tubercle and distally along the second metatarsal axis and tibial crest.  The block was pinned to remove 2 mm off the more deficient medial side.  The resection was made with an oscillating saw. Size 5 was the most appropriate tibial component and proximal tibia was prepared to modular drill and keel punch for the size 5.  Femoral preparation was completed with intercondylar cut.  Size 5 mobile bearing tibial trial, size 5 posterior stabilized femoral trial, and a 10-mm posterior stabilized rotating platform insert trial were placed.  There was a tiny bit of varus-valgus with 10 and with the 12.5 which allowed for full extension with excellent varus-valgus and anterior-posterior balance throughout full range of motion.  Patella was everted and the thickness measured  to be 27 mm.  Freehand resection taken to 15 mm, 41 template was placed, lug holes were drilled, trial patella was placed and it tracked normally.  Osteophytes were removed off the posterior femur with the trial in place.  All trials were removed and the cut bone surfaces prepared with pulsatile lavage. Cement was mixed and once ready for implantation, the size 5 mobile bearing tibial tray, size 5 posterior stabilized femur and 41 patella were cemented into place.  The patella was held with the clamp.  Trial 12.5 insert was placed, knee held in full extension, all extruded cement removed.  When the cement fully hardened, then the permanent 12.5  mm posterior stabilized rotating platform insert was placed and tibial tray.  Wound was copiously irrigated with saline solution and the arthrotomy closed over Hemovac drain with interrupted #1 PDS. Tourniquet released after total time of 39 minutes.  Flexion against gravity was 135 degrees.  Patella tracked normally.  Subcu was closed with interrupted 2-0 Vicryl, subcuticular running 4-0 Monocryl.  The catheter for Marcaine pain pump was placed and pump initiated.  Incision was cleaned and dried and Steri-Strips and bulky sterile dressing applied.  We then placed into a knee immobilizer, awakened and transported to recovery in stable condition.     Ollen Gross, M.D.     FA/MEDQ  D:  05/17/2010  T:  05/17/2010  Job:  [id number]  Electronically Signed by Ollen Gross M.D. on 05/19/2010 02:55:12 PM

## 2010-05-20 LAB — CBC
Hemoglobin: 9.8 g/dL — ABNORMAL LOW (ref 13.0–17.0)
MCH: 29.9 pg (ref 26.0–34.0)
Platelets: 200 10*3/uL (ref 150–400)
RBC: 3.28 MIL/uL — ABNORMAL LOW (ref 4.22–5.81)
WBC: 9.9 10*3/uL (ref 4.0–10.5)

## 2010-05-20 NOTE — Letter (Signed)
Summary: Results Follow up Letter  [facility] [place]  [address] [place], [place] [address]   Phone: [phone]  Fax: [phone]    05/12/2010 MRN: [id number]  DASHIELL FRANCHINO [address] [place], [place]  [address]  [place]  Dear Mr. Colegrove,  The following are the results of your recent test(s):  Test         Result    Pap Smear:        Normal _____  Not Normal _____ Comments: ______________________________________________________ Cholesterol: LDL(Bad cholesterol):         Your goal is less than:         HDL (Good cholesterol):       Your goal is more than: Comments:  ______________________________________________________ Mammogram:        Normal _____  Not Normal _____ Comments:  ___________________________________________________________________ Hemoccult:        Normal _X____  Not normal _______ Comments:    _____________________________________________________________________ Other Tests:    We routinely do not discuss normal results over the telephone.  If you desire a copy of the results, or you have any questions about this information we can discuss them at your next office visit.   Sincerely,

## 2010-05-27 ENCOUNTER — Encounter: Payer: Self-pay | Admitting: Internal Medicine

## 2010-06-10 NOTE — Medication Information (Signed)
Summary: Nonadherence with Metoprolol & Amlodipine/[facility]  Nonadherence with Metoprolol & Amlodipine/[facility]   Imported By: Lanelle Bal 06/02/2010 09:31:00  _____________________________________________________________________  External Attachment:    Type:   Image     Comment:   External Document

## 2010-08-17 NOTE — Assessment & Plan Note (Signed)
[facility]                            CARDIOLOGY OFFICE NOTE   Patrick, Lutz                         MRN:          [id number]  DATE:04/30/2008                            DOB:          17-Sep-1934    [name] is a very pleasant, healthy 76 year old gentleman.  He has been  an active businessman in the community for 50 years.  Currently, he is  the head of the [name].  He is not having any chest pain.  He  is not having any significant palpitations.  He saw Dr. Alwyn Lutz recently.  He had PACs noted.  His EKG actually showed some bigeminal PACs.  He has  not had syncope or presyncope.  He has not had any significant chest  pain.  In the past, he has had a cardiac evaluation through Dr. Glennon Lutz.  He had an exercise test in the remote past.  I believe, he  also may have had an echo in the remote past.  The patient does have a  strong family history of coronary artery disease.  His brother had CABG  at a young age.  The patient's cholesterol is elevated and is being  treated.  He also has hypertension.   PAST MEDICAL HISTORY:   ALLERGIES:  He is allergic to topical caines.   MEDICATIONS:  1. Clarinex.  2. Lipitor 30.  3. Metoprolol 25 b.i.d.  4. Celebrex.  5. Amlodipine 10.  6. Benazepril 40.  7. Prilosec 20 p.r.n.  8. Aspirin 81.   OTHER MEDICAL PROBLEMS:  See the list below.   REVIEW OF SYSTEMS:  He is not having any GI or GU complaints.  He has no  fevers or chills.  There are no headaches.  There are no skin rashes.  His review of systems otherwise is negative.   PHYSICAL EXAMINATION:  VITAL SIGNS:  Blood pressure today is 115/70.  His pulse is 53.  GENERAL:  The patient is oriented to person, time, and place.  Affect is  normal.  HEENT:  No xanthelasma.  He has normal extraocular motion.  NECK:  There are no carotid bruits.  There is no jugular venous  distention.  LUNGS:  Clear.  Respiratory effort is not labored.  CARDIAC:  An S1 with an S2.  There are no clicks or significant murmurs.  ABDOMEN:  Soft.  EXTREMITIES:  He has no peripheral edema.   EKG from Dr. Alwyn Lutz reveals that he does have PACs.  At one point, he  actually has bigeminal PACs.  There are no diagnostic changes in the  QRS.  Blood work shows that his liver function is normal.  Renal  function is normal.  Hemoglobin is normal.  TSH normal.  Triglycerides  105, HDL 64, and LDL 110 on Lipitor 30 mg daily.   PROBLEMS INCLUDE:  1. Family history of coronary artery disease.  2. Hypertension, treated.  3. Gastroesophageal reflux disease.  4. Hyperlipidemia.  With an LDL of 110, it would be nice to have his      LDL below 100.  He does have a few pounds that he could lose.  I      will leave it up to Dr. Alwyn Lutz as to whether or not his Lipitor      dose is to be increased.  I did strongly encourage that the patient      increase his exercise program as he can tolerate and lose some      weight.  5. History of radical prostatectomy by [name] in the past.  His      current PSA his 0.  6. History of colon polyps.  7. History of probable mild obstructive sleep apnea.  The patient says      that he had a sleep study.  He and Dr. Alwyn Lutz decided that his      problem was only mild and that CPAP was not needed.  8. Premature atrial contractions seen on his EKG.  At this point, I      feel that this represents no significant abnormality.  He does not      need an echo at this point.  I talked with him about possibility of      an exercise test.  He prefers not to do any testing unless I have a      high level of concern.  There is no sign that the PACs represent      ischemia.   I have encouraged [name] to try to lose a few pounds and to follow up  with Dr. Alwyn Lutz.  I am recommending no other studies relative to the  premature atrial beats that are noted.  He will be in touch if he has  any problems.  I have asked him to return in 1  year for follow up.  If  we decide to be more aggressive, we can proceed with exercise test.     Patrick Abed, MD, [facility]  Electronically Signed    JDK/MedQ  DD: 04/30/2008  DT: 05/01/2008  Job #: [id number]

## 2010-09-17 ENCOUNTER — Other Ambulatory Visit: Payer: Self-pay | Admitting: Internal Medicine

## 2010-09-17 MED ORDER — ATORVASTATIN CALCIUM 20 MG PO TABS: ORAL_TABLET | ORAL | Status: DC

## 2010-09-17 NOTE — Telephone Encounter (Signed)
RX printed and sent to [facility], prescribing error populated in in-basket stating med did not go through

## 2010-09-17 NOTE — Telephone Encounter (Signed)
Addended by: Edgardo Roys on: 09/17/2010 11:44 AM   Modules accepted: Orders

## 2010-09-17 NOTE — Telephone Encounter (Signed)
Sent in under wrong doc name- changed.

## 2011-01-21 ENCOUNTER — Encounter: Payer: Self-pay | Admitting: Gastroenterology

## 2011-03-09 ENCOUNTER — Telehealth: Payer: Self-pay | Admitting: Internal Medicine

## 2011-03-09 NOTE — Telephone Encounter (Signed)
Patient had shingles about 5 - 7  Years ago - he wants to know if he can get shingles vac - if so wants rx to take pharmacy

## 2011-03-10 ENCOUNTER — Encounter: Payer: Self-pay | Admitting: Gastroenterology

## 2011-03-10 NOTE — Telephone Encounter (Signed)
Spoke with patient's wife, I asked if she would like for me to forward rx to the pharmacy and she indicated her husband will have to call me back when he comes home to discuss if he will pick-up or have me send directly to pharmacy

## 2011-03-10 NOTE — Telephone Encounter (Signed)
Patient called and stated he is not certain if it is covered here or at pharmacy and if we could call his insurance company for him  I called and left message on voicemail informing patient he is responsible for contacting insurance company in this matter, they will give him direct advice.

## 2011-03-11 NOTE — Telephone Encounter (Signed)
Patient called and scheduled appointment for Nurse Visit

## 2011-03-18 ENCOUNTER — Ambulatory Visit (INDEPENDENT_AMBULATORY_CARE_PROVIDER_SITE_OTHER): Payer: Medicare Other

## 2011-03-18 DIAGNOSIS — Z23 Encounter for immunization: Secondary | ICD-10-CM

## 2011-04-11 ENCOUNTER — Encounter: Payer: Self-pay | Admitting: Gastroenterology

## 2011-05-03 ENCOUNTER — Other Ambulatory Visit: Payer: Medicare Other | Admitting: Gastroenterology

## 2011-05-04 ENCOUNTER — Ambulatory Visit (INDEPENDENT_AMBULATORY_CARE_PROVIDER_SITE_OTHER): Payer: Medicare Other | Admitting: Internal Medicine

## 2011-05-04 ENCOUNTER — Encounter: Payer: Self-pay | Admitting: Internal Medicine

## 2011-05-04 VITALS — BP 118/65 | HR 77 | Temp 98.4°F | Resp 12 | Ht 70.25 in | Wt 198.4 lb

## 2011-05-04 DIAGNOSIS — Z0000 Encounter for general adult medical examination without abnormal findings: Secondary | ICD-10-CM

## 2011-05-04 DIAGNOSIS — E782 Mixed hyperlipidemia: Secondary | ICD-10-CM

## 2011-05-04 DIAGNOSIS — Z860100 Personal history of colon polyps, unspecified: Secondary | ICD-10-CM

## 2011-05-04 DIAGNOSIS — Z8546 Personal history of malignant neoplasm of prostate: Secondary | ICD-10-CM

## 2011-05-04 DIAGNOSIS — M2550 Pain in unspecified joint: Secondary | ICD-10-CM

## 2011-05-04 DIAGNOSIS — I10 Essential (primary) hypertension: Secondary | ICD-10-CM

## 2011-05-04 DIAGNOSIS — Z23 Encounter for immunization: Secondary | ICD-10-CM

## 2011-05-04 DIAGNOSIS — K219 Gastro-esophageal reflux disease without esophagitis: Secondary | ICD-10-CM

## 2011-05-04 LAB — BASIC METABOLIC PANEL
CO2: 29 mEq/L (ref 19–32)
Calcium: 9.6 mg/dL (ref 8.4–10.5)
GFR: 107.92 mL/min (ref >60.00)
Potassium: 5.3 mEq/L — ABNORMAL HIGH (ref 3.5–5.1)
Sodium: 144 mEq/L (ref 135–145)

## 2011-05-04 LAB — HEPATIC FUNCTION PANEL
AST: 31 U/L (ref 0–37)
Albumin: 4.6 g/dL (ref 3.5–5.2)
Alkaline Phosphatase: 67 U/L (ref 39–117)
Total Protein: 7.3 g/dL (ref 6.0–8.3)

## 2011-05-04 LAB — POCT URINALYSIS DIPSTICK
Bilirubin, UA: NEGATIVE
Glucose, UA: NEGATIVE
Leukocytes, UA: NEGATIVE
Nitrite, UA: NEGATIVE

## 2011-05-04 LAB — CBC WITH DIFFERENTIAL/PLATELET
Basophils Relative: 0.6 % (ref 0.0–3.0)
HCT: 44 % (ref 39.0–52.0)
Hemoglobin: 15.1 g/dL (ref 13.0–17.0)
Lymphocytes Relative: 30.4 % (ref 12.0–46.0)
Lymphs Abs: 2.1 10*3/uL (ref 0.7–4.0)
MCHC: 34.3 g/dL (ref 30.0–36.0)
Monocytes Relative: 5.7 % (ref 3.0–12.0)
Neutro Abs: 4 10*3/uL (ref 1.4–7.7)
RBC: 4.94 Mil/uL (ref 4.22–5.81)

## 2011-05-04 NOTE — Progress Notes (Signed)
Subjective:    Patient ID: [name], male    DOB: [birth year], 77 y.o.   MRN: [id number]  HPI Medicare Wellness Visit:  The following psychosocial & medical history were reviewed as required by Medicare.   Social history: caffeine: 2- 2.5 cups coffeee , alcohol: 14/ week ,  tobacco use : never  & exercise : gym & walking 3-4 X/ week  > 60 min   Home & personal  safety / fall risk: no issues, activities of daily living:no limitations , seatbelt use : yes , and smoke alarm employment : yes .  Power of Attorney/Living Will status : in place  Vision ( as recorded per Nurse) & Hearing  evaluation :  See exam. Orientation :oriented X 3 , memory & recall :good, spelling or math testing:good,and mood & affect : normal . Depression / anxiety: denied Travel history : [place] 1998  , immunization status :up to date , transfusion history: ? no, and preventive health surveillance ( colonoscopies, BMD , etc as per protocol/ [facility]): colonoscopy pending, Dental care:  Seen every 6 mos . Chart reviewed &  Updated. Active issues reviewed & addressed.       Review of Systems HYPERTENSION: Disease Monitoring: Blood pressure range- not checked  Chest pain, palpitations- no (see PACs on EKG)       Dyspnea- no Medications: Compliance- yes Lightheadedness,Syncope- no   Edema-no  HYPERLIPIDEMIA: Disease Monitoring: See symptoms for Hypertension Medications: Compliance- no  Abd pain, bowel changes- no   Muscle aches- no but LBP occasionally        Objective:   Physical Exam Gen.: Healthy and well-nourished in appearance. Alert, appropriate and cooperative throughout exam.Appears younger than stated age  Head: Normocephalic without obvious abnormalities Eyes: No corneal or conjunctival inflammation noted. Pupils equal round reactive to light and accommodation.  Extraocular motion intact. Vision grossly normal with lenses. Ears: External  ear exam reveals no significant lesions or deformities.  Canals clear .TMs normal. Hearing is grossly normal bilaterally. Nose: External nasal exam reveals no deformity or inflammation. Nasal mucosa are pink and moist. No lesions or exudates noted. Septum dislocated to L  Mouth: Oral mucosa and oropharynx reveal no lesions or exudates. Teeth in good repair. Neck: No deformities, masses, or tenderness noted. Range of motion & Thyroid normal Lungs: Normal respiratory effort; chest expands symmetrically. Lungs are clear to auscultation without rales, wheezes, or increased work of breathing. Heart: Normal rate and rhythm. Normal S1 and S2. No gallop, click, or rub. No murmur.Occasional premature beat Abdomen: Bowel sounds normal; abdomen soft and nontender. No masses, organomegaly or hernias noted. Genitalia: as per Urology .                                                                                   Musculoskeletal/extremities: Mild lordosis noted of  the thoracic  spine. No clubbing, cyanosis, or  edema noted. Range of motion  normal .Tone & strength  normal.Joints : mixed finger changes. Nail health  Good. Large ganglion L dorsal wrist Vascular: Carotid, radial artery, dorsalis pedis and  posterior tibial pulses are full and equal. No bruits present. Neurologic: Alert and oriented x3. Deep  tendon reflexes symmetrical and normal.          Skin: Intact without suspicious lesions or rashes. Lymph: No cervical, axillary  lymphadenopathy present. Psych: Mood and affect are normal. Normally interactive                                                                                         Assessment & Plan:   #1 Medicare Wellness Exam; criteria met ; data entered #2 Problem List reviewed ; Assessment/ Recommendations made Plan: see Orders

## 2011-05-04 NOTE — Patient Instructions (Addendum)
Preventive Health Care: Exercise at least 30-45 minutes a day,  3-4 days a week.  Eat a low-fat diet with lots of fruits and vegetables, up to 7-9 servings per day. Consume < 40 grams of sugar per day from foods & drinks with High Fructose Corn Sugar as # 1,2,3 or # 4 on label.  Blood Pressure Goal  Ideally is an AVERAGE < 135/85. This AVERAGE should be calculated from @ least 5-7 BP readings taken @ different times of day on different days of week. You should not respond to isolated BP readings , but rather the AVERAGE for that week  To prevent palpitations or premature beats, avoid stimulants such as decongestants, diet pills, nicotine, or caffeine (coffee, tea, cola, or chocolate) to excess.

## 2011-05-05 LAB — TSH: TSH: 1.62 u[IU]/mL (ref 0.35–5.50)

## 2011-05-05 LAB — LDL CHOLESTEROL, DIRECT: Direct LDL: 118.2 mg/dL

## 2011-05-12 ENCOUNTER — Other Ambulatory Visit: Payer: Self-pay

## 2011-05-12 ENCOUNTER — Ambulatory Visit (AMBULATORY_SURGERY_CENTER): Payer: Medicare Other | Admitting: *Deleted

## 2011-05-12 VITALS — Ht 71.0 in | Wt 200.7 lb

## 2011-05-12 DIAGNOSIS — M2550 Pain in unspecified joint: Secondary | ICD-10-CM

## 2011-05-12 DIAGNOSIS — Z1211 Encounter for screening for malignant neoplasm of colon: Secondary | ICD-10-CM

## 2011-05-12 DIAGNOSIS — I10 Essential (primary) hypertension: Secondary | ICD-10-CM

## 2011-05-12 MED ORDER — METOPROLOL TARTRATE 50 MG PO TABS: ORAL_TABLET | ORAL | Status: DC

## 2011-05-12 MED ORDER — AMLODIPINE BESYLATE 10 MG PO TABS: 10.0000 mg | ORAL_TABLET | Freq: Every day | ORAL | Status: DC

## 2011-05-12 MED ORDER — ATORVASTATIN CALCIUM 20 MG PO TABS: ORAL_TABLET | ORAL | Status: DC

## 2011-05-12 MED ORDER — PEG-KCL-NACL-NASULF-NA ASC-C 100 G PO SOLR: ORAL | Status: DC

## 2011-05-12 MED ORDER — BENAZEPRIL HCL 40 MG PO TABS: 40.0000 mg | ORAL_TABLET | Freq: Every day | ORAL | Status: DC

## 2011-05-12 MED ORDER — CELECOXIB 200 MG PO CAPS: 200.0000 mg | ORAL_CAPSULE | Freq: Two times a day (BID) | ORAL | Status: DC

## 2011-05-12 NOTE — Telephone Encounter (Signed)
Patient would like rx's mailed

## 2011-05-26 ENCOUNTER — Encounter: Payer: Self-pay | Admitting: Gastroenterology

## 2011-05-26 ENCOUNTER — Ambulatory Visit (AMBULATORY_SURGERY_CENTER): Payer: Medicare Other | Admitting: Gastroenterology

## 2011-05-26 DIAGNOSIS — Z860100 Personal history of colon polyps, unspecified: Secondary | ICD-10-CM

## 2011-05-26 DIAGNOSIS — D126 Benign neoplasm of colon, unspecified: Secondary | ICD-10-CM

## 2011-05-26 DIAGNOSIS — Z1211 Encounter for screening for malignant neoplasm of colon: Secondary | ICD-10-CM

## 2011-05-26 MED ORDER — SODIUM CHLORIDE 0.9 % IV SOLN: 500.0000 mL | INTRAVENOUS | Status: DC

## 2011-05-26 NOTE — Op Note (Signed)
[facility] [address] [place], [place]  [address]  COLONOSCOPY PROCEDURE REPORT  PATIENT:  [name], [name]  MR#:  [id number] BIRTHDATE:  08/25/1934, 77 yrs. old  GENDER:  male ENDOSCOPIST:  Judie Petit T. Russella Dar, MD, [facility]  PROCEDURE DATE:  05/26/2011 PROCEDURE:  Colonoscopy with snare polypectomy ASA CLASS:  Class II INDICATIONS:  1) surveillance and high-risk screening  2) history of pre-cancerous (adenomatous) colon polyps: 02/2002. MEDICATIONS:   These medications were titrated to patient response per physician's verbal order, Fentanyl 75 mcg IV, Versed 8 mg IV DESCRIPTION OF PROCEDURE:   After the risks benefits and alternatives of the procedure were thoroughly explained, informed consent was obtained.  Digital rectal exam was performed and revealed no abnormalities.   The LB CF-[id number] endoscope was introduced through the anus and advanced to the cecum, which was identified by both the appendix and ileocecal valve, without limitations.  The quality of the prep was good, using MoviPrep. The instrument was then slowly withdrawn as the colon was fully examined. <<PROCEDUREIMAGES>> FINDINGS:  A sessile polyp was found in the mid transverse colon. It was 7 mm in size. Polyp was snared without cautery. Retrieval was successful.  Moderate diverticulosis was found in the sigmoid to descending colon. Otherwise normal colonoscopy without other polyps, masses, vascular ectasias, or inflammatory changes. Retroflexed views in the rectum revealed no abnormalities.  The time to cecum =  2  minutes. The scope was then withdrawn (time = 12.25  min) from the patient and the procedure completed.  COMPLICATIONS:  None  ENDOSCOPIC IMPRESSION: 1) 7 mm sessile polyp in the mid transverse colon 2) Moderate diverticulosis in the sigmoid to descending colon  RECOMMENDATIONS: 1) Await pathology results 2) High fiber diet with liberal fluid intake. 3) Repeat Colonoscopy in 5  years.  Venita Lick. Russella Dar, MD, [facility]  n. eSIGNED:   Venita Lick. Arlie Riker at 05/26/2011 08:59 AM  [place] [place], [phone]

## 2011-05-26 NOTE — Progress Notes (Signed)
No complaints noted in the recovery room. [place]  Patient did not experience any of the following events: a burn prior to discharge; a fall within the facility; wrong site/side/patient/procedure/implant event; or a hospital transfer or hospital admission upon discharge from the facility. (G8907) Patient did not have preoperative order for IV antibiotic SSI prophylaxis. (G8918)  

## 2011-05-26 NOTE — Patient Instructions (Signed)
Resume your prior medications today.  Call if any questions or concerns.  YOU HAD AN ENDOSCOPIC PROCEDURE TODAY AT THE [facility]: Refer to the procedure report that was given to you for any specific questions about what was found during the examination.  If the procedure report does not answer your questions, please call your gastroenterologist to clarify.  If you requested that your care partner not be given the details of your procedure findings, then the procedure report has been included in a sealed envelope for you to review at your convenience later.  YOU SHOULD EXPECT: Some feelings of bloating in the abdomen. Passage of more gas than usual.  Walking can help get rid of the air that was put into your GI tract during the procedure and reduce the bloating. If you had a lower endoscopy (such as a colonoscopy or flexible sigmoidoscopy) you may notice spotting of blood in your stool or on the toilet paper. If you underwent a bowel prep for your procedure, then you may not have a normal bowel movement for a few days.  DIET: Your first meal following the procedure should be a light meal and then it is ok to progress to your normal diet.  A half-sandwich or bowl of soup is an example of a good first meal.  Heavy or fried foods are harder to digest and may make you feel nauseous or bloated.  Likewise meals heavy in dairy and vegetables can cause extra gas to form and this can also increase the bloating.  Drink plenty of fluids but you should avoid alcoholic beverages for 24 hours.  ACTIVITY: Your care partner should take you home directly after the procedure.  You should plan to take it easy, moving slowly for the rest of the day.  You can resume normal activity the day after the procedure however you should NOT DRIVE or use heavy machinery for 24 hours (because of the sedation medicines used during the test).    SYMPTOMS TO REPORT IMMEDIATELY: A gastroenterologist can be reached at any hour.   During normal business hours, 8:30 AM to 5:00 PM Monday through Friday, call [phone].  After hours and on weekends, please call the GI answering service at [phone] who will take a message and have the physician on call contact you.   Following lower endoscopy (colonoscopy or flexible sigmoidoscopy):  Excessive amounts of blood in the stool  Significant tenderness or worsening of abdominal pains  Swelling of the abdomen that is new, acute  Fever of 100F or higher  Following upper endoscopy (EGD)  Vomiting of blood or coffee ground material  New chest pain or pain under the shoulder blades  Painful or persistently difficult swallowing  New shortness of breath  Fever of 100F or higher  Black, tarry-looking stools  FOLLOW UP: If any biopsies were taken you will be contacted by phone or by letter within the next 1-3 weeks.  Call your gastroenterologist if you have not heard about the biopsies in 3 weeks.  Our staff will call the home number listed on your records the next business day following your procedure to check on you and address any questions or concerns that you may have at that time regarding the information given to you following your procedure. This is a courtesy call and so if there is no answer at the home number and we have not heard from you through the emergency physician on call, we will assume that you have returned  to your regular daily activities without incident.  SIGNATURES/CONFIDENTIALITY: You and/or your care partner have signed paperwork which will be entered into your electronic medical record.  These signatures attest to the fact that that the information above on your After Visit Summary has been reviewed and is understood.  Full responsibility of the confidentiality of this discharge information lies with you and/or your care-partner. Diverticulosis Diverticulosis is a common condition that develops when small pouches (diverticula) form in the wall of  the colon. The risk of diverticulosis increases with age. It happens more often in people who eat a low-fiber diet. Most individuals with diverticulosis have no symptoms. Those individuals with symptoms usually experience abdominal pain, constipation, or loose stools (diarrhea). HOME CARE INSTRUCTIONS  Increase the amount of fiber in your diet as directed by your caregiver or dietician. This may reduce symptoms of diverticulosis.  Your caregiver may recommend taking a dietary fiber supplement.  Drink at least 6 to 8 glasses of water each day to prevent constipation.  Try not to strain when you have a bowel movement.  Your caregiver may recommend avoiding nuts and seeds to prevent complications, although this is still an uncertain benefit.  Only take over-the-counter or prescription medicines for pain, discomfort, or fever as directed by your caregiver.  FOODS WITH HIGH FIBER CONTENT INCLUDE: Fruits. Apple, peach, pear, tangerine, raisins, prunes.  Vegetables. Brussels sprouts, asparagus, broccoli, cabbage, carrot, cauliflower, romaine lettuce, spinach, summer squash, tomato, winter squash, zucchini.  Starchy Vegetables. Baked beans, kidney beans, lima beans, split peas, lentils, potatoes (with skin).  Grains. Whole wheat bread, brown rice, bran flake cereal, plain oatmeal, white rice, shredded wheat, bran muffins.  SEEK IMMEDIATE MEDICAL CARE IF:  You develop increasing pain or severe bloating.  You have an oral temperature above 102 F (38.9 C), not controlled by medicine.  You develop vomiting or bowel movements that are bloody or black.  Document Released: 12/17/2003 Document Revised: 12/01/2010 Document Reviewed: 08/19/2009 [facility] Patient Information 2012 [place], [place].Colon Polyps A polyp is extra tissue that grows inside your body. Colon polyps grow in the large intestine. The large intestine, also called the colon, is part of your digestive system. It is a long, hollow tube at the end  of your digestive tract where your body makes and stores stool. Most polyps are not dangerous. They are benign. This means they are not cancerous. But over time, some types of polyps can turn into cancer. Polyps that are smaller than a pea are usually not harmful. But larger polyps could someday become or may already be cancerous. To be safe, doctors remove all polyps and test them.  WHO GETS POLYPS? Anyone can get polyps, but certain people are more likely than others. You may have a greater chance of getting polyps if: You are over 50.  You have had polyps before.  Someone in your family has had polyps.  Someone in your family has had cancer of the large intestine.  Find out if someone in your family has had polyps. You may also be more likely to get polyps if you:  Eat a lot of fatty foods.  Smoke.  Drink alcohol.  Do not exercise.  Eat too much.  SYMPTOMS  Most small polyps do not cause symptoms. People often do not know they have one until their caregiver finds it during a regular checkup or while testing them for something else. Some people do have symptoms like these: Bleeding from the anus. You might notice blood on your  underwear or on toilet paper after you have had a bowel movement.  Constipation or diarrhea that lasts more than a week.  Blood in the stool. Blood can make stool look black or it can show up as red streaks in the stool.  If you have any of these symptoms, see your caregiver. HOW DOES THE DOCTOR TEST FOR POLYPS? The doctor can use four tests to check for polyps: Digital rectal exam. The caregiver wears gloves and checks your rectum (the last part of the large intestine) to see if it feels normal. This test would find polyps only in the rectum. Your caregiver may need to do one of the other tests listed below to find polyps higher up in the intestine.  Barium enema. The caregiver puts a liquid called barium into your rectum before taking x-rays of your large intestine.  Barium makes your intestine look white in the pictures. Polyps are dark, so they are easy to see.  Sigmoidoscopy. With this test, the caregiver can see inside your large intestine. A thin flexible tube is placed into your rectum. The device is called a sigmoidoscope, which has a light and a tiny video camera in it. The caregiver uses the sigmoidoscope to look at the last third of your large intestine.  Colonoscopy. This test is like sigmoidoscopy, but the caregiver looks at all of the large intestine. It usually requires sedation. This is the most common method for finding and removing polyps.  TREATMENT  The caregiver will remove the polyp during sigmoidoscopy or colonoscopy. The polyp is then tested for cancer.  If you have had polyps, your caregiver may want you to get tested regularly in the future.  PREVENTION  There is not one sure way to prevent polyps. You might be able to lower your risk of getting them if you: Eat more fruits and vegetables and less fatty food.  Do not smoke.  Avoid alcohol.  Exercise every day.  Lose weight if you are overweight.  Eating more calcium and folate can also lower your risk of getting polyps. Some foods that are rich in calcium are milk, cheese, and broccoli. Some foods that are rich in folate are chickpeas, kidney beans, and spinach.  Aspirin might help prevent polyps. Studies are under way.  Document Released: 12/16/2003 Document Revised: 12/01/2010 Document Reviewed: 05/23/2007 [facility] Patient Information 2012 [place], [place].

## 2011-05-27 ENCOUNTER — Telehealth: Payer: Self-pay | Admitting: *Deleted

## 2011-05-27 NOTE — Telephone Encounter (Signed)
  Follow up Call-  Call back number 05/26/2011  Post procedure Call Back phone  # [phone]  Permission to leave phone message Yes     Patient questions:  Do you have a fever, pain , or abdominal swelling? no Pain Score  0 *  Have you tolerated food without any problems? yes  Have you been able to return to your normal activities? yes  Do you have any questions about your discharge instructions: Diet   no Medications  no Follow up visit  no  Do you have questions or concerns about your Care? no  Actions: * If pain score is 4 or above: No action needed, pain <4.

## 2011-05-31 ENCOUNTER — Encounter: Payer: Self-pay | Admitting: Gastroenterology

## 2011-06-20 ENCOUNTER — Telehealth: Payer: Self-pay | Admitting: Internal Medicine

## 2011-06-20 DIAGNOSIS — K219 Gastro-esophageal reflux disease without esophagitis: Secondary | ICD-10-CM

## 2011-06-20 MED ORDER — OMEPRAZOLE MAGNESIUM 20 MG PO TBEC: 20.0000 mg | DELAYED_RELEASE_TABLET | Freq: Every day | ORAL | Status: DC

## 2011-06-20 NOTE — Telephone Encounter (Signed)
Refill for  Prilosec OTC 20MG  Tab #42  Qty 42  Take 1-tablet in the morning as needed  Last fill date 11.28.12

## 2011-06-20 NOTE — Telephone Encounter (Signed)
Rx sent 

## 2012-01-30 ENCOUNTER — Telehealth: Payer: Self-pay | Admitting: Internal Medicine

## 2012-01-30 DIAGNOSIS — K219 Gastro-esophageal reflux disease without esophagitis: Secondary | ICD-10-CM

## 2012-01-30 MED ORDER — OMEPRAZOLE MAGNESIUM 20 MG PO TBEC: 20.0000 mg | DELAYED_RELEASE_TABLET | Freq: Every day | ORAL | Status: DC

## 2012-01-30 NOTE — Telephone Encounter (Signed)
Refill: Prilosec otc 20 mg tab #42. Take 1 tablet in the morning as needed. Last fill 9.6.13

## 2012-01-30 NOTE — Telephone Encounter (Addendum)
RX sent

## 2012-01-31 NOTE — Telephone Encounter (Signed)
OK X1 

## 2012-04-24 ENCOUNTER — Other Ambulatory Visit: Payer: Self-pay | Admitting: Internal Medicine

## 2012-04-24 DIAGNOSIS — I10 Essential (primary) hypertension: Secondary | ICD-10-CM

## 2012-04-24 MED ORDER — BENAZEPRIL HCL 40 MG PO TABS: 40.0000 mg | ORAL_TABLET | Freq: Every day | ORAL | Status: DC

## 2012-04-24 NOTE — Telephone Encounter (Signed)
PT NEEDS 90-DAY SUPPLY SENT TO [facility] for Benzapril -- cb# [phone] note pt has upcoming CPE appt

## 2012-05-11 ENCOUNTER — Ambulatory Visit (INDEPENDENT_AMBULATORY_CARE_PROVIDER_SITE_OTHER): Payer: BC Managed Care – PPO | Admitting: Internal Medicine

## 2012-05-11 ENCOUNTER — Encounter: Payer: Self-pay | Admitting: Internal Medicine

## 2012-05-11 VITALS — BP 138/80 | HR 70 | Temp 98.0°F | Resp 14 | Ht 70.5 in | Wt 194.4 lb

## 2012-05-11 DIAGNOSIS — E785 Hyperlipidemia, unspecified: Secondary | ICD-10-CM

## 2012-05-11 DIAGNOSIS — Z8546 Personal history of malignant neoplasm of prostate: Secondary | ICD-10-CM

## 2012-05-11 DIAGNOSIS — Z860100 Personal history of colon polyps, unspecified: Secondary | ICD-10-CM

## 2012-05-11 DIAGNOSIS — Z0000 Encounter for general adult medical examination without abnormal findings: Secondary | ICD-10-CM

## 2012-05-11 DIAGNOSIS — I10 Essential (primary) hypertension: Secondary | ICD-10-CM

## 2012-05-11 DIAGNOSIS — K219 Gastro-esophageal reflux disease without esophagitis: Secondary | ICD-10-CM

## 2012-05-11 LAB — HEPATIC FUNCTION PANEL
ALT: 27 U/L (ref 0–53)
Alkaline Phosphatase: 68 U/L (ref 39–117)
Bilirubin, Direct: 0.2 mg/dL (ref 0.0–0.3)
Total Protein: 7.1 g/dL (ref 6.0–8.3)

## 2012-05-11 LAB — CBC WITH DIFFERENTIAL/PLATELET
Basophils Relative: 0.7 % (ref 0.0–3.0)
Eosinophils Relative: 4.7 % (ref 0.0–5.0)
Hemoglobin: 14.4 g/dL (ref 13.0–17.0)
Lymphocytes Relative: 28.4 % (ref 12.0–46.0)
MCV: 87.8 fl (ref 78.0–100.0)
Monocytes Absolute: 0.4 10*3/uL (ref 0.1–1.0)
Neutro Abs: 3.3 10*3/uL (ref 1.4–7.7)
Neutrophils Relative %: 59.1 % (ref 43.0–77.0)
RBC: 4.83 Mil/uL (ref 4.22–5.81)
WBC: 5.5 10*3/uL (ref 4.5–10.5)

## 2012-05-11 LAB — BASIC METABOLIC PANEL
Chloride: 105 mEq/L (ref 96–112)
Creatinine, Ser: 0.7 mg/dL (ref 0.4–1.5)
Sodium: 140 mEq/L (ref 135–145)

## 2012-05-11 LAB — PSA: PSA: 0 ng/mL — ABNORMAL LOW (ref 0.10–4.00)

## 2012-05-11 LAB — LIPID PANEL: Total CHOL/HDL Ratio: 3

## 2012-05-11 LAB — TSH: TSH: 1.47 u[IU]/mL (ref 0.35–5.50)

## 2012-05-11 MED ORDER — CLONAZEPAM 0.5 MG PO TABS: ORAL_TABLET | ORAL | Status: DC

## 2012-05-11 MED ORDER — OMEPRAZOLE MAGNESIUM 20 MG PO TBEC: 20.0000 mg | DELAYED_RELEASE_TABLET | Freq: Every day | ORAL | Status: DC

## 2012-05-11 MED ORDER — BENAZEPRIL HCL 40 MG PO TABS: 40.0000 mg | ORAL_TABLET | Freq: Every day | ORAL | Status: DC

## 2012-05-11 MED ORDER — METOPROLOL TARTRATE 50 MG PO TABS: ORAL_TABLET | ORAL | Status: DC

## 2012-05-11 MED ORDER — AMLODIPINE BESYLATE 10 MG PO TABS: 10.0000 mg | ORAL_TABLET | Freq: Every day | ORAL | Status: DC

## 2012-05-11 MED ORDER — ATORVASTATIN CALCIUM 20 MG PO TABS: ORAL_TABLET | ORAL | Status: DC

## 2012-05-11 NOTE — Progress Notes (Signed)
Subjective:    Patient ID: [name], male    DOB: June 26, 1934, 78 y.o.   MRN: [id number]  HPI Medicare Wellness Visit:  Psychosocial & medical history were reviewed as required by Medicare (abuse,antisocial behavioral risks,firearm risk).  Social history: caffeine:2 cups / day  , alcohol: 14 /week  ,  tobacco use:  never Exercise :  Walks 3.5 mpd 3-4X/week, golf 3 X/week No home & personal  safety / fall risk Activities of daily living: no limitations  Seatbelt  and smoke alarm employed. Power of Attorney/Living Will status : in place Ophthalmology exam current Hearing evaluation not current Orientation :oriented X 3  Memory & recall :good Math testing:good Mood & affect : normal . Depression / anxiety: denied Travel history : last 1998 [place] Immunization status :current Shingles,Flu, PNA, tetanus needed Transfusion history:  none  Preventive health surveillance ( colonoscopy as per protocol/ [facility]): current Dental care:  Every 6 mos. Chart reviewed &  Updated. Active issues reviewed & addressed.       Review of Systems HYPERTENSION: Disease Monitoring: Blood pressure range-not monitored  Chest pain, palpitations- no Dyspnea- no Medications: Compliance-yes  Lightheadedness,Syncope- no Edema- no  HYPERLIPIDEMIA: Disease Monitoring: See symptoms for Hypertension Medications: Compliance- yes Abd pain, bowel changes-no; he has had intermittent rectal bleeding related to anal fissure. As noted colonoscopy is up-to-date    Muscle aches- no          Objective:   Physical Exam Gen.: Healthy and well-nourished in appearance. Alert, appropriate and cooperative throughout exam. Appears younger than stated age  Head: Normocephalic without obvious abnormalities  Eyes: No corneal or conjunctival inflammation noted. Pupils equal round reactive to light and accommodation. Extraocular motion intact. Vision grossly normal with lenses. Ears: External  ear exam reveals no  significant lesions or deformities. Canals clear .TMs normal. Hearing is grossly normal bilaterally. Nose: External nasal exam reveals no deformity or inflammation. Nasal mucosa are pink and moist. No lesions or exudates noted.   Mouth: Oral mucosa and oropharynx reveal no lesions or exudates. Teeth in good repair. Neck: No deformities, masses, or tenderness noted. Range of motion & Thyroid normal. Lungs: Normal respiratory effort; chest expands symmetrically. Lungs are clear to auscultation without rales, wheezes, or increased work of breathing. Heart: Normal rate and rhythm. Normal S1 and S2. No gallop, click, or rub. No  Murmur. Rare premature Abdomen: Bowel sounds normal; abdomen soft and nontender. No masses, organomegaly or hernias noted. Genitalia: S/P prostatectomy.external hemorrhoidal tags. Musculoskeletal/extremities: No deformity or scoliosis noted of  the thoracic or lumbar spine. No clubbing, cyanosis, or edema noted. Range of motion decreased L knee .Tone & strength  normal.Joints  DJD changes. Nail health good. Able to lie down & sit up w/o help. Negative SLR bilaterally Vascular: Carotid, radial artery, dorsalis pedis and  posterior tibial pulses are full and equal. No bruits present. Neurologic: Alert and oriented x3. Deep tendon reflexes symmetrical and normal.  Skin: Intact without suspicious lesions or rashes. Solar changes. Lymph: No cervical, axillary lymphadenopathy present. Psych: Mood and affect are normal. Normally interactive  Assessment & Plan:  #1 Medicare Wellness Exam; criteria met ; data entered #2 Problem List reviewed ; Assessment/ Recommendations made #3 hemorrhoidal tags Plan: see Orders

## 2012-05-11 NOTE — Patient Instructions (Addendum)
Use tissue to cleanse  stool after bowel movements clean with TUCKS  or Baby Wipes. If bleeding use Sitz baths followed by the  Medication 2 to 3 times a day to shrink the hemorrhoids. Stay well hydrated and avoid popcorn and some other materials which might aggravate hemorrhoids. To prevent palpitations or premature beats, avoid stimulants such as decongestants, diet pills, nicotine, or caffeine (coffee, tea, cola, or chocolate) to excess.   If you activate My Chart; the results can be released to you as soon as they populate from the lab. If you choose not to use this program; the labs have to be reviewed, copied & mailed   causing a delay in getting the results to you.

## 2012-05-14 ENCOUNTER — Encounter: Payer: Self-pay | Admitting: Internal Medicine

## 2012-05-14 NOTE — Telephone Encounter (Signed)
Hopp please advise on labs done on 05/11/12

## 2012-05-16 ENCOUNTER — Other Ambulatory Visit: Payer: Self-pay | Admitting: Dermatology

## 2012-05-16 NOTE — Telephone Encounter (Signed)
That is the guideline doctor's are required to meet. You need to call the number listed in the after visit summary for My Chart to get a new number.  By Sunday afternoon I had reviewed all data on my computer  completely; there were no outstanding results on my desktop. By Monday morning I had 17 x-ray, lab, or patient chart records to review & address. The data reproduces faster than rabbits or weeds.

## 2012-06-01 ENCOUNTER — Encounter: Payer: Self-pay | Admitting: Internal Medicine

## 2012-11-12 ENCOUNTER — Telehealth: Payer: Self-pay | Admitting: Internal Medicine

## 2012-11-12 NOTE — Telephone Encounter (Signed)
Patient says that it is time to renew his Prilosec rx, however, he wants to switch to Nexium instead. He is asking for a prescription to be sent to [facility]. Please follow up.

## 2012-11-13 MED ORDER — ESOMEPRAZOLE MAGNESIUM 40 MG PO CPDR: 40.0000 mg | DELAYED_RELEASE_CAPSULE | Freq: Every day | ORAL | Status: DC

## 2012-11-13 NOTE — Telephone Encounter (Signed)
Spoke with patient's wife and advised of change in Rx.

## 2012-11-13 NOTE — Telephone Encounter (Signed)
Hopp please advise on patients request to switch from Prilosec to Nexium

## 2012-11-13 NOTE — Telephone Encounter (Signed)
OK # 30 , R X 2 to assess response

## 2012-11-15 ENCOUNTER — Telehealth: Payer: Self-pay | Admitting: Internal Medicine

## 2012-11-15 NOTE — Telephone Encounter (Signed)
Patient called stating we sent [facility] and he did not authorize this. He states he doesn't know why we would sent it to them and it has now been mailed. Per patient, [facility] state he cannot send the medication back, even if unopened. Please advise.

## 2012-11-16 ENCOUNTER — Other Ambulatory Visit: Payer: Self-pay | Admitting: *Deleted

## 2012-11-18 ENCOUNTER — Other Ambulatory Visit: Payer: Self-pay | Admitting: Internal Medicine

## 2012-11-19 ENCOUNTER — Other Ambulatory Visit: Payer: Self-pay

## 2012-11-19 ENCOUNTER — Telehealth: Payer: Self-pay | Admitting: *Deleted

## 2012-11-19 MED ORDER — CLONAZEPAM 0.5 MG PO TABS: ORAL_TABLET | ORAL | Status: DC

## 2012-11-19 NOTE — Telephone Encounter (Signed)
Patient is returning call, please call at [phone]

## 2012-11-19 NOTE — Telephone Encounter (Signed)
Pharmacy's requesting a refill for Clonazepam 0.5 mg Last ov 05/11/12 last date filled 05/11/12 #30 5-R.  No UDS on file No Controlled Substance Contract on file. Please Advise    Ag cma

## 2012-11-19 NOTE — Telephone Encounter (Signed)
I have returned patients call and he is concerned about his prescription Nexium 20 mg 42 ct.   Patient has had to pay $64.00 for his Nexium from his mail order pharmacy and he stated that he wanted to it to be sent to [facility] and he had the wrong (mg) sent also.  He wanted OTC 42 ct Nexium patient ask that this be noted in epic so he doesn't pay this amount again and be stuck with medication that he can't use because his mail order pharmacy will not let him return it.   AG cma

## 2012-11-19 NOTE — Telephone Encounter (Signed)
Rx was refilled for clonazepam 0.5 mg.  Ag cma

## 2012-11-19 NOTE — Telephone Encounter (Signed)
OK X1, R X 2 

## 2013-01-15 ENCOUNTER — Ambulatory Visit: Payer: Medicare Other | Admitting: Internal Medicine

## 2013-01-15 ENCOUNTER — Encounter: Payer: Self-pay | Admitting: Lab

## 2013-01-16 ENCOUNTER — Ambulatory Visit (INDEPENDENT_AMBULATORY_CARE_PROVIDER_SITE_OTHER): Payer: Medicare Other | Admitting: Internal Medicine

## 2013-01-16 ENCOUNTER — Encounter: Payer: Self-pay | Admitting: Internal Medicine

## 2013-01-16 ENCOUNTER — Ambulatory Visit: Payer: Medicare Other | Admitting: Internal Medicine

## 2013-01-16 VITALS — BP 129/76 | HR 51 | Temp 98.0°F | Wt 194.2 lb

## 2013-01-16 DIAGNOSIS — Z23 Encounter for immunization: Secondary | ICD-10-CM

## 2013-01-16 DIAGNOSIS — Z86010 Personal history of colonic polyps: Secondary | ICD-10-CM

## 2013-01-16 DIAGNOSIS — R1031 Right lower quadrant pain: Secondary | ICD-10-CM

## 2013-01-16 DIAGNOSIS — M129 Arthropathy, unspecified: Secondary | ICD-10-CM

## 2013-01-16 MED ORDER — TRAMADOL HCL 50 MG PO TABS: 50.0000 mg | ORAL_TABLET | Freq: Three times a day (TID) | ORAL | Status: DC | PRN

## 2013-01-16 NOTE — Patient Instructions (Signed)
Please keep a diary of symptoms and signs. Enter significant symptoms, frequency, severity ( 1-10 scale), possible triggers, and response to medication, exercise or other therapeutic options as discussed. 

## 2013-01-16 NOTE — Progress Notes (Signed)
  Subjective:    Patient ID: [name], male    DOB: 05-16-1934, 78 y.o.   MRN: [id number]  HPI  For 2 weeks he has had right lower quadrant/right lateral pelvic area. It may have been related to walking 18 holes of golf carrying his bag. The pain is described as dull, up to level III, nonradiating, and intermittent.  Lying in left lateral decubitus position may aggravate it. 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 He also has history of degenerative joint disease mainly of the hands and knees. He takes Celebrex the days he plays golf and supplements this with arthritis strength Tylenol. He is on a prophylactic baby aspirin  He drinks 2 cups of coffee each day. He'll have 2-3 drinks socially per day   Review of Systems  He specifically denies fever, chills, sweats, or weight loss.  He also has no dyspepsia, dysphagia, anorexia, hematemesis, melena, or rectal bleeding. The discomfort is not associated with bowel changes of constipation or diarrhea. He has no nausea vomiting   He also has no dysuria, pyuria, or hematuria.  He has not noted any redness or swelling in the area of discomfort.  He does not have radicular back pain radiating to this area.     Objective:   Physical Exam Gen.: Healthy and well-nourished in appearance. Alert, appropriate and cooperative throughout exam.Appears younger than stated age  Eyes: No  icterus   Neck: No deformities, masses, or tenderness noted. Range of motion good. Lungs: Normal respiratory effort; chest expands symmetrically. Lungs are clear to auscultation without rales, wheezes, or increased work of breathing. Heart: Slow rate and regular rhythm. Normal S1 and S2. No gallop, click, or rub. S4 w/o murmur. Abdomen: Bowel sounds normal; abdomen soft and nontender even to percussion. No masses or organomegaly . Minor ventral hernia noted.                      Musculoskeletal/extremities: No deformity or scoliosis noted of  the thoracic or lumbar spine.   No clubbing, cyanosis, edema, or significant extremity  deformity noted. Range of motion normal .Tone & strength  Normal. Joints  reveal minor  DJD DIP changes. Nail health good. Able to lie down & sit up w/o help. Negative SLR bilaterally. Some discomfort at the R buttock with rotation Vascular: Carotid, radial artery, dorsalis pedis and  posterior tibial pulses are full and equal. No bruits present. Neurologic: Alert and oriented x3. Deep tendon reflexes symmetrical and normal.  Gait normal  including heel & toe walking .        Skin: Intact without suspicious lesions or rashes. Lymph: No cervical, axillary, or inguinal lymphadenopathy present. Psych: Mood and affect are normal. Normally interactive                                                                                        Assessment & Plan:  #1 right lower quadrant discomfort; muscle skeletal etiology suggested.  Plan: See orders

## 2013-01-18 ENCOUNTER — Ambulatory Visit: Payer: Medicare Other | Admitting: Internal Medicine

## 2013-01-18 ENCOUNTER — Ambulatory Visit: Payer: Medicare Other | Admitting: Family Medicine

## 2013-04-17 ENCOUNTER — Encounter: Payer: Self-pay | Admitting: Internal Medicine

## 2013-05-13 ENCOUNTER — Encounter: Payer: Medicare Other | Admitting: Internal Medicine

## 2013-05-15 ENCOUNTER — Other Ambulatory Visit: Payer: Self-pay | Admitting: Dermatology

## 2013-05-22 ENCOUNTER — Encounter: Payer: Self-pay | Admitting: Internal Medicine

## 2013-05-22 ENCOUNTER — Ambulatory Visit (INDEPENDENT_AMBULATORY_CARE_PROVIDER_SITE_OTHER): Payer: BC Managed Care – PPO | Admitting: Internal Medicine

## 2013-05-22 ENCOUNTER — Other Ambulatory Visit (INDEPENDENT_AMBULATORY_CARE_PROVIDER_SITE_OTHER): Payer: 59

## 2013-05-22 VITALS — BP 120/60 | HR 59 | Temp 97.4°F | Resp 12 | Ht 69.25 in | Wt 195.4 lb

## 2013-05-22 DIAGNOSIS — I10 Essential (primary) hypertension: Secondary | ICD-10-CM

## 2013-05-22 DIAGNOSIS — Z860100 Personal history of colon polyps, unspecified: Secondary | ICD-10-CM

## 2013-05-22 DIAGNOSIS — Z0000 Encounter for general adult medical examination without abnormal findings: Secondary | ICD-10-CM

## 2013-05-22 DIAGNOSIS — Z8546 Personal history of malignant neoplasm of prostate: Secondary | ICD-10-CM

## 2013-05-22 DIAGNOSIS — E782 Mixed hyperlipidemia: Secondary | ICD-10-CM

## 2013-05-22 LAB — HEPATIC FUNCTION PANEL
ALK PHOS: 62 U/L (ref 39–117)
ALT: 29 U/L (ref 0–53)
AST: 27 U/L (ref 0–37)
Albumin: 4.3 g/dL (ref 3.5–5.2)
BILIRUBIN TOTAL: 0.9 mg/dL (ref 0.3–1.2)
Bilirubin, Direct: 0.1 mg/dL (ref 0.0–0.3)
Total Protein: 7 g/dL (ref 6.0–8.3)

## 2013-05-22 LAB — CBC WITH DIFFERENTIAL/PLATELET
Basophils Absolute: 0 10*3/uL (ref 0.0–0.1)
Basophils Relative: 0.3 % (ref 0.0–3.0)
EOS ABS: 0.3 10*3/uL (ref 0.0–0.7)
EOS PCT: 4.9 % (ref 0.0–5.0)
HCT: 45.6 % (ref 39.0–52.0)
Hemoglobin: 15 g/dL (ref 13.0–17.0)
Lymphocytes Relative: 26.2 % (ref 12.0–46.0)
Lymphs Abs: 1.7 10*3/uL (ref 0.7–4.0)
MCHC: 32.8 g/dL (ref 30.0–36.0)
MCV: 90.6 fl (ref 78.0–100.0)
MONO ABS: 0.4 10*3/uL (ref 0.1–1.0)
Monocytes Relative: 6.1 % (ref 3.0–12.0)
Neutro Abs: 4.1 10*3/uL (ref 1.4–7.7)
Neutrophils Relative %: 62.5 % (ref 43.0–77.0)
PLATELETS: 275 10*3/uL (ref 150.0–400.0)
RBC: 5.03 Mil/uL (ref 4.22–5.81)
RDW: 13.7 % (ref 11.5–14.6)
WBC: 6.5 10*3/uL (ref 4.5–10.5)

## 2013-05-22 LAB — BASIC METABOLIC PANEL
BUN: 14 mg/dL (ref 6–23)
CHLORIDE: 103 meq/L (ref 96–112)
CO2: 29 mEq/L (ref 19–32)
CREATININE: 0.7 mg/dL (ref 0.4–1.5)
Calcium: 9.5 mg/dL (ref 8.4–10.5)
GFR: 110.73 mL/min (ref >60.00)
Glucose, Bld: 87 mg/dL (ref 70–99)
Potassium: 4.9 mEq/L (ref 3.5–5.1)
Sodium: 139 mEq/L (ref 135–145)

## 2013-05-22 LAB — LIPID PANEL
CHOL/HDL RATIO: 3
Cholesterol: 204 mg/dL — ABNORMAL HIGH (ref 0–200)
HDL: 74.1 mg/dL (ref >39.00)
TRIGLYCERIDES: 77 mg/dL (ref 0.0–149.0)
VLDL: 15.4 mg/dL (ref 0.0–40.0)

## 2013-05-22 LAB — TSH: TSH: 1.76 u[IU]/mL (ref 0.35–5.50)

## 2013-05-22 LAB — PSA: PSA: 0 ng/mL — AB (ref 0.10–4.00)

## 2013-05-22 LAB — LDL CHOLESTEROL, DIRECT: Direct LDL: 113.8 mg/dL

## 2013-05-22 MED ORDER — AMLODIPINE BESYLATE 10 MG PO TABS: 10.0000 mg | ORAL_TABLET | Freq: Every day | ORAL | Status: AC

## 2013-05-22 MED ORDER — ATORVASTATIN CALCIUM 20 MG PO TABS: ORAL_TABLET | ORAL | Status: AC

## 2013-05-22 MED ORDER — METOPROLOL TARTRATE 50 MG PO TABS: ORAL_TABLET | ORAL | Status: AC

## 2013-05-22 MED ORDER — BENAZEPRIL HCL 40 MG PO TABS: 40.0000 mg | ORAL_TABLET | Freq: Every day | ORAL | Status: AC

## 2013-05-22 NOTE — Progress Notes (Signed)
Subjective:    Patient ID: [name], male    DOB: 1935-03-25, 79 y.o.   MRN: [id number]  HPI Medicare Wellness Visit: Psychosocial and medical history were reviewed as required by Medicare (history related to abuse, antisocial behavior , firearm risk). Social history: Caffeine:2 cups coffee/day  , Alcohol: 2 drinks/ day , Tobacco KGS:UPJSR Exercise:see below Personal safety/fall risk:no  Limitations of activities of daily living:no Seatbelt/ smoke alarm use:yes Healthcare Power of Attorney/Living Will status: in place Ophthalmologic exam status:UTD Hearing evaluation status:not current Orientation: Oriented X 3 Memory and recall: good Math testing: good Depression/anxiety assessment: no Foreign travel [place] Immunization status for influenza/pneumonia/ shingles /tetanus: Transfusion history:no Preventive health care maintenance status: Colonoscopy as per protocol/standard care:UTD Dental care:every 6 mos Chart reviewed and updated. Active issues reviewed and addressed as documented below.    Review of Systems  A modified heart healthy diet is followed; exercise encompasses >3 hrs  4 -5  times per week as walking without symptoms.  Family history is neg for premature coronary disease. There is medication compliance with the statin.  Low dose ASA taken Specifically denied are  chest pain, palpitations, dyspnea, or claudication.  Significant abdominal symptoms, memory deficit, or myalgias not present. Blood pressure average : 120/89 Compliant with anti hypertemsive medication. No lightheadedness or other adverse medication effect described. Significant headaches , epistaxis, or edema absent.     Objective:   Physical Exam Gen.: Healthy and well-nourished in appearance. Alert, appropriate and cooperative throughout exam. Appears younger than stated age  Head: Normocephalic without obvious abnormalities  Eyes: No corneal or conjunctival inflammation  noted. Pupils equal round reactive to light and accommodation. Extraocular motion intact.  Ears: External  ear exam reveals no significant lesions or deformities. Canals clear .TMs normal. Hearing is grossly normal bilaterally. Nose: External nasal exam reveals no deformity or inflammation. Nasal mucosa are pink and moist. No lesions or exudates noted.   Mouth: Oral mucosa and oropharynx reveal no lesions or exudates. Teeth in good repair. Neck: No deformities, masses, or tenderness noted. Range of motion decreased. Thyroid normal. Lungs: Normal respiratory effort; chest expands symmetrically. Lungs are clear to auscultation without rales, wheezes, or increased work of breathing. Heart: Slow rate and slightly irregular rhythm. Normal S1 and S2. No gallop, click, or rub. No murmur. Abdomen: Bowel sounds normal; abdomen soft and nontender. No masses, organomegaly or hernias noted. Genitalia: as per Urology                                Musculoskeletal/extremities: Slight accentuated curvature of upper thoracic spine. No clubbing, cyanosis, edema, or significant extremity  deformity noted. Range of motion normal .Tone & strength normal. Hand joints normal  Fingernail  health good. Able to lie down & sit up w/o help. Negative SLR bilaterally Vascular: Carotid, radial artery, dorsalis pedis and  posterior tibial pulses are full and equal. No bruits present. Neurologic: Alert and oriented x3. Deep tendon reflexes symmetrical and 1/2+  Gait normal        Skin: Intact without suspicious lesions or rashes. Lymph: No cervical, axillary lymphadenopathy present. Psych: Mood and affect are normal. Normally interactive  Assessment & Plan:  #1 Medicare Wellness Exam; criteria met ; data entered #2 Problem List/Diagnoses reviewed Plan:  Assessments made/ Orders entered  

## 2013-05-22 NOTE — Progress Notes (Signed)
Pre visit review using our clinic review tool, if applicable. No additional management support is needed unless otherwise documented below in the visit note. 

## 2013-05-22 NOTE — Patient Instructions (Addendum)
Your next office appointment will be determined based upon review of your pending labs . Those instructions will be transmitted to you through My Chart  Minimal Blood Pressure Goal= AVERAGE < 140/90;  Ideal is an AVERAGE < 135/85. This AVERAGE should be calculated from @ least 5-7 BP readings taken @ different times of day on different days of week. You should not respond to isolated BP readings , but rather the AVERAGE for that week .Please bring your  blood pressure cuff to office visits to verify that it is reliable.It  can also be checked against the blood pressure device at the pharmacy. Finger or wrist cuffs are not dependable; an arm cuff is. To prevent  premature beats, avoid stimulants such as decongestants, diet pills, nicotine, or caffeine (coffee, tea, cola, or chocolate) to excess.

## 2013-05-23 ENCOUNTER — Encounter: Payer: Self-pay | Admitting: Internal Medicine

## 2013-06-04 ENCOUNTER — Ambulatory Visit (INDEPENDENT_AMBULATORY_CARE_PROVIDER_SITE_OTHER): Payer: 59 | Admitting: Internal Medicine

## 2013-06-04 ENCOUNTER — Encounter: Payer: Self-pay | Admitting: Internal Medicine

## 2013-06-04 VITALS — BP 120/80 | HR 73 | Temp 97.7°F | Wt 198.6 lb

## 2013-06-04 DIAGNOSIS — J218 Acute bronchiolitis due to other specified organisms: Secondary | ICD-10-CM

## 2013-06-04 DIAGNOSIS — J219 Acute bronchiolitis, unspecified: Secondary | ICD-10-CM

## 2013-06-04 MED ORDER — FLUTICASONE-SALMETEROL 250-50 MCG/DOSE IN AEPB: 1.0000 | INHALATION_SPRAY | Freq: Two times a day (BID) | RESPIRATORY_TRACT | Status: DC

## 2013-06-04 MED ORDER — AZITHROMYCIN 250 MG PO TABS: ORAL_TABLET | ORAL | Status: DC

## 2013-06-04 NOTE — Progress Notes (Signed)
   Subjective:    Patient ID: [name], male    DOB: 1935/01/16, 79 y.o.   MRN: [id number]  HPI Symptoms began on Saturday, 06/01/13 as what he describes as a "wheezing cough" x 3 days, productive thick clear/yellow; clear nasal secretions blood-tinged.  Frontal pressure, ear pressure. Has taken Delsum, Tylenol PM for cough relief at night.  Last abx more than one year ago.   Scheduled on Thursday 8:30 for derm face surgery. Wife has cataract surgery scheduled for Thursday.   Review of Systems Denies fever, chills, sweats, n/v, otalgia, otorrhea, sore throat.      Objective:   Physical Exam General appearance:good health ;well nourished; no acute distress or increased work of breathing is present. No lymphadenopathy about the head, neck, or axilla noted.  Eyes: No conjunctival inflammation or lid edema is present. There is no scleral icterus.  Ears: External ear exam shows no significant lesions or deformities. Otoscopic examination reveals clear canals, tympanic membranes are intact bilaterally without bulging, retraction, inflammation or discharge.  Nose: External nasal examination shows no deformity or inflammation. Nasal mucosa are pink and moist without lesions or exudate; scant blood-tinged clear secretions noted R nare. No septal dislocation or deviation.No obstruction to airflow.  Oral exam: Dental hygiene is good; lips and gums are healthy appearing.There is mild oropharyngeal erythema or exudate noted.  Neck: No deformities, thyromegaly, masses, or tenderness noted. Supple with full range of motion without pain.  Heart: Normal rate and regular rhythm. S1 and S2 normal without gallop, murmur, click, rub or other extra sounds.  Lungs:Chest clear to auscultation; mild wheezing noted bronchovesicular region. No rhonchi,rales ,or rubs present.No increased work of breathing.  Extremities: No cyanosis, edema, or clubbing noted  Skin: Warm & dry w/o jaundice or tenting.     Assessment  & Plan:  #1 bronchitis with bronchospasm  #2 rhinitis  See orders

## 2013-06-04 NOTE — Progress Notes (Signed)
   Subjective:    Patient ID: [name], male    DOB: 1934-06-04, 79 y.o.   MRN: [id number]  HPI Symptoms began on Saturday, 06/01/13 as what he describes as a "wheezing cough" x 3 days, productive thick clear/yellow; clear nasal secretions blood-tinged. Frontal pressure, ear pressure.  Has taken Delsum, Tylenol PM for cough relief at night.  Last abx more than one year ago.   Scheduled on Thursday 8:30  for derm face surgery.  Wife has cataract surgery scheduled for Thursday.   Review of Systems Denies fever, chills, sweats, n/v, otalgia, otorrhea, sore throat.     Objective:   Physical Exam General appearance:good health ;well nourished; no acute distress or increased work of breathing is present.  No  lymphadenopathy about the head, neck, or axilla noted.   Eyes: No conjunctival inflammation or lid edema is present. There is no scleral icterus.  Ears:  External ear exam shows no significant lesions or deformities.  Otoscopic examination reveals clear canals, tympanic membranes are intact bilaterally without bulging, retraction, inflammation or discharge.  Nose:  External nasal examination shows no deformity or inflammation. Nasal mucosa are pink and moist without lesions or exudate; scant blood-tinged clear secretions noted R nare. No septal dislocation or deviation.No obstruction to airflow.   Oral exam: Dental hygiene is good; lips and gums are healthy appearing.There is no oropharyngeal erythema or exudate noted.   Neck:  No deformities, thyromegaly, masses, or tenderness noted.   Supple with full range of motion without pain.   Heart:  Normal rate and regular rhythm. S1 and S2 normal without gallop, murmur, click, rub or other extra sounds.   Lungs:Chest clear to auscultation; mild wheezing noted bronchovesicular region. No rhonchi,rales ,or rubs present.No increased work of breathing.    Extremities:  No cyanosis, edema, or clubbing  noted    Skin: Warm & dry w/o jaundice  or tenting.        Assessment & Plan:  #1 bronchitis with bronchospasm  #2 rhinitis

## 2013-06-04 NOTE — Progress Notes (Signed)
Pre visit review using our clinic review tool, if applicable. No additional management support is needed unless otherwise documented below in the visit note. 

## 2013-06-04 NOTE — Patient Instructions (Addendum)
Plain Mucinex (NOT D) for thick secretions ;force NON dairy fluids .   Nasal cleansing in the shower as discussed with lather of mild shampoo.After 10 seconds wash off lather while  exhaling through nostrils. Make sure that all residual soap is removed to prevent irritation.  Flonase OR Nasacort AQ 1 spray in each nostril twice a day as needed. Use the "crossover" technique into opposite nostril spraying toward opposite ear @ 45 degree angle, not straight up into nostril.  Use a Neti pot daily only  as needed for significant sinus congestion; going from open side to congested side . Plain Allegra (NOT D )  160 daily , Loratidine 10 mg , OR Zyrtec 10 mg @ bedtime  as needed for itchy eyes & sneezing. Use Eucerin   twice a day  for the dry nasal septum. The drying & inflammation cause nose bleeds.    Advair one  inhalation every 12 hours; gargle and spit after use

## 2013-06-06 ENCOUNTER — Encounter: Payer: Self-pay | Admitting: Internal Medicine

## 2013-07-12 ENCOUNTER — Other Ambulatory Visit: Payer: Self-pay | Admitting: Internal Medicine

## 2013-08-12 ENCOUNTER — Encounter: Payer: Self-pay | Admitting: Internal Medicine

## 2013-09-02 ENCOUNTER — Encounter: Payer: Self-pay | Admitting: Internal Medicine

## 2013-12-10 ENCOUNTER — Other Ambulatory Visit: Payer: Self-pay | Admitting: Dermatology

## 2014-05-07 ENCOUNTER — Other Ambulatory Visit: Payer: Self-pay | Admitting: Dermatology

## 2014-09-29 ENCOUNTER — Other Ambulatory Visit: Payer: Self-pay

## 2015-05-06 ENCOUNTER — Other Ambulatory Visit: Payer: Self-pay | Admitting: Dermatology

## 2015-05-26 ENCOUNTER — Ambulatory Visit (INDEPENDENT_AMBULATORY_CARE_PROVIDER_SITE_OTHER): Payer: Medicare PPO | Admitting: Neurology

## 2015-05-26 ENCOUNTER — Encounter: Payer: Self-pay | Admitting: Neurology

## 2015-05-26 VITALS — BP 132/74 | HR 70 | Resp 16 | Ht 69.25 in | Wt 195.0 lb

## 2015-05-26 DIAGNOSIS — R351 Nocturia: Secondary | ICD-10-CM

## 2015-05-26 DIAGNOSIS — R0683 Snoring: Secondary | ICD-10-CM | POA: Diagnosis not present

## 2015-05-26 DIAGNOSIS — E663 Overweight: Secondary | ICD-10-CM | POA: Diagnosis not present

## 2015-05-26 DIAGNOSIS — R0681 Apnea, not elsewhere classified: Secondary | ICD-10-CM

## 2015-05-26 DIAGNOSIS — G4719 Other hypersomnia: Secondary | ICD-10-CM | POA: Diagnosis not present

## 2015-05-26 NOTE — Patient Instructions (Signed)

## 2015-05-26 NOTE — Progress Notes (Signed)
Subjective:    Patient ID: [name] is a 81 y.o. male.  HPI     Star Age, MD, PhD [facility] [address], [address] [place],  [address]  Dear Dr. Dagmar Hait,   I saw your patient, [name], upon your kind request in my neurologic clinic today for initial consultation of his sleep disorder, in particular, concern for underlying obstructive sleep apnea. The patient is unaccompanied today. As you know, [name] is a very pleasant 81 year old right-handed gentleman with an underlying medical history of hypertension, hyperlipidemia, degenerative joint disease, prostate cancer, status post radical prostatectomy 1997, history of skin cancer, allergic rhinitis, arthritis, status post left total knee arthroplasty under Dr. Maureen Ralphs in 2012, and overweight state, who reports snoring and excessive daytime somnolence. He denies a FHx of OSA. His Epworth sleepiness score is 2 out of 24 today, his fatigue score is 2 out of 63 today. He reports having had a sleep study several years ago may be 10+ years ago and per his report it was borderline at the time. He has irregular breathing and brief pauses, per wife's report, and snorting sounds per son's feedback.  He denies morning headaches, but has nocturia 2-3 times per night. He has started having some L knee discomfort but denies RLS symptoms.  He tries to exercise at the [facility] and plays Golf about 3 days a week.  He drinks caffeine in the form of coffee, about 2 cups a day. He drinks about 2 mixed drinks per day, he is a nonsmoker. He retired from [employer] after 40 years. He has 3 children, 7 grandchildren. He lives with his wife. He does not have trouble falling asleep or staying asleep. I reviewed your office note from 10/22/2014, which you kindly included.  His Past Medical History Is Significant For: Past Medical History  Diagnosis Date  . HTN (hypertension)   . Other and unspecified  hyperlipidemia   . GERD (gastroesophageal reflux disease)   . History of colon polyps     adenoma;[facility]  . PAC (premature atrial contraction)   . Colon polyp   . History of prostate cancer   . History of skin cancer     His Past Surgical History Is Significant For: Past Surgical History  Procedure Laterality Date  . Hemorroidectomy    . Rotator cuff repair       X 3(L twice); Dr Theda Sers  . Prostatectomy  1997    Dr Rosana Hoes  . Total knee arthroplasty      L; Dr Maureen Ralphs  . Colonoscopy w/ polypectomy  last 2013     X 2; [facility]  . Prostate surgery      His Family History Is Significant For: Family History  Problem Relation Age of Onset  . Stroke Mother 25  . Heart disease Father 36    ?MI  . Heart disease Brother     CBAG in 54s  . Dementia Brother     vascular dementia  . Colon cancer Neg Hx   . Stomach cancer Neg Hx   . Diabetes Neg Hx   . Cancer Neg Hx     His Social History Is Significant For: Social History   Social History  . Marital Status: Married    Spouse Name: N/A  . Number of Children: 3  . Years of Education: BA   Occupational History  . Retired    Social History Main Topics  .  Smoking status: Never Smoker   . Smokeless tobacco: Never Used  . Alcohol Use: 8.4 oz/week    14 Shots of liquor per week     Comment:  14 drinks /week  . Drug Use: No  . Sexual Activity: Not Asked   Other Topics Concern  . None   Social History Narrative   Drinks 2 cups of coffee a day     His Allergies Are:  No Known Allergies:   His Current Medications Are:  Outpatient Encounter Prescriptions as of 05/26/2015  Medication Sig  . amLODipine (NORVASC) 10 MG tablet Take 1 tablet (10 mg total) by mouth daily.  Marland Kitchen aspirin 81 MG tablet Take 160 mg by mouth daily.  Marland Kitchen atorvastatin (LIPITOR) 20 MG tablet TAKE ONE AND ONE-HALF TABLETS DAILY  . benazepril (LOTENSIN) 40 MG tablet Take 1 tablet (40 mg total) by mouth daily.  . celecoxib (CELEBREX) 200 MG capsule  Take 1 capsule (200 mg total) by mouth 2 (two) times daily.  Marland Kitchen glucosamine-chondroitin 500-400 MG tablet Take 1 tablet by mouth daily.  . Loratadine (CLARITIN) 10 MG CAPS Take by mouth.  . metoprolol (LOPRESSOR) 50 MG tablet 1/2 by mouth two times daily  . Multiple Vitamin (MULTIVITAMIN) tablet Take 1 tablet by mouth daily.  Marland Kitchen NEXIUM 24HR 20 MG capsule TAKE (1) CAPSULE DAILY.  Marland Kitchen temazepam (RESTORIL) 15 MG capsule Take 15 mg by mouth at bedtime as needed for sleep.  . [DISCONTINUED] azithromycin (ZITHROMAX Z-PAK) 250 MG tablet 2 day 1, then 1 qd  . [DISCONTINUED] clonazePAM (KLONOPIN) 0.5 MG tablet 1 by mouth qhs prn  . [DISCONTINUED] Fluticasone-Salmeterol (ADVAIR DISKUS) 250-50 MCG/DOSE AEPB Inhale 1 puff into the lungs 2 (two) times daily.   No facility-administered encounter medications on file as of 05/26/2015.  :  Review of Systems:  Out of a complete 14 point review of systems, all are reviewed and negative with the exception of these symptoms as listed below:  Review of Systems  HENT: Positive for rhinorrhea.   Neurological:       Had sleep study about 10+ years ago, was told he was "borderline".  Wakes up many times during the night, snoring, witnessed apnea, occasionally takes a nap, may fall asleep when sittings still.   Hematological: Bruises/bleeds easily.  Epworth Sleepiness Scale 0= would never doze 1= slight chance of dozing 2= moderate chance of dozing 3= high chance of dozing  Sitting and reading:0 Watching TV:1 Sitting inactive in a public place (ex. Theater or meeting):0 As a passenger in a car for an hour without a break:0 Lying down to rest in the afternoon:1 Sitting and talking to someone: 0 Sitting quietly after lunch (no alcohol):0 In a car, while stopped in traffic:0 Total:2  Objective:  Neurologic Exam  Physical Exam Physical Examination:   Filed Vitals:   05/26/15 1004  BP: 132/74  Pulse: 70  Resp: 16    General Examination: The patient is  a very pleasant 81 y.o. male in no acute distress. He appears well-developed and well-nourished and very well groomed.   HEENT: Normocephalic, atraumatic, pupils are equal, round and reactive to light and accommodation. Funduscopic exam is normal with sharp disc margins noted. Extraocular tracking is good without limitation to gaze excursion or nystagmus noted. Normal smooth pursuit is noted. Hearing is grossly intact. Tympanic membranes are clear bilaterally. Face is symmetric with normal facial animation and normal facial sensation. Speech is clear with no dysarthria noted. There is no hypophonia. There is no  lip, neck/head, jaw or voice tremor. Neck is supple with full range of passive and active motion. There are no carotid bruits on auscultation. Oropharynx exam reveals: mild mouth dryness, adequate dental hygiene and moderate airway crowding, due to thicker soft palate, redundant soft palate, large uvula, tonsils may be absent or small. Mallampati is class II. Tongue protrudes centrally and palate elevates symmetrically. Neck size is 17.5 inches. He has a Mild overbite. Nasal inspection reveals no significant nasal mucosal bogginess or redness and no septal deviation.   Chest: Clear to auscultation without wheezing, rhonchi or crackles noted.  Heart: S1+S2+0, regular and normal without murmurs, rubs or gallops noted.   Abdomen: Soft, non-tender and non-distended with normal bowel sounds appreciated on auscultation.  Extremities: There is no pitting edema in the distal lower extremities bilaterally. Pedal pulses are intact.  Skin: Warm and dry without trophic changes noted. There are mild varicose veins.  Musculoskeletal: exam reveals no obvious joint deformities, tenderness or joint swelling or erythema.   Neurologically:  Mental status: The patient is awake, alert and oriented in all 4 spheres. His immediate and remote memory, attention, language skills and fund of knowledge are appropriate.  There is no evidence of aphasia, agnosia, apraxia or anomia. Speech is clear with normal prosody and enunciation. Thought process is linear. Mood is normal and affect is normal.  Cranial nerves II - XII are as described above under HEENT exam. In addition: shoulder shrug is normal with equal shoulder height noted. Motor exam: Normal bulk, strength and tone is noted. There is no drift, tremor or rebound. Romberg is negative. Reflexes are 1+ throughout. Fine motor skills and coordination: intact with normal finger taps, normal hand movements, normal rapid alternating patting, normal foot taps and normal foot agility.  Cerebellar testing: No dysmetria or intention tremor on finger to nose testing. Heel to shin is unremarkable bilaterally. There is no truncal or gait ataxia.  Sensory exam: intact to light touch, pinprick, vibration, temperature sense in the upper and lower extremities.  Gait, station and balance: He stands easily. No veering to one side is noted. No leaning to one side is noted. Posture is age-appropriate and stance is narrow based. Gait shows normal stride length and normal pace. No problems turning are noted. He turns en bloc. Tandem walk is difficult for him.   Assessment and Plan:  In summary, [name] is a very pleasant 81 y.o.-year old male with an underlying medical history of hypertension, hyperlipidemia, degenerative joint disease, prostate cancer, status post radical prostatectomy 1997, history of skin cancer, allergic rhinitis, arthritis, status post left total knee arthroplasty under Dr. Maureen Ralphs in 2012, and overweight state, whose history and physical exam are indeed concerning for obstructive sleep apnea (OSA). I had a long chat with the patient about my findings and the diagnosis of OSA, its prognosis and treatment options. We talked about medical treatments, surgical interventions and non-pharmacological approaches. I explained in particular the risks and ramifications of  untreated moderate to severe OSA, especially with respect to developing cardiovascular disease down the Road, including congestive heart failure, difficult to treat hypertension, cardiac arrhythmias, or stroke. Even type 2 diabetes has, in part, been linked to untreated OSA. Symptoms of untreated OSA include daytime sleepiness, memory problems, mood irritability and mood disorder such as depression and anxiety, lack of energy, as well as recurrent headaches, especially morning headaches. We talked about trying to maintain a healthy lifestyle in general, as well as the importance of weight control. I  encouraged the patient to eat healthy, exercise daily and keep well hydrated, to keep a scheduled bedtime and wake time routine, to not skip any meals and eat healthy snacks in between meals. I advised the patient not to drive when feeling sleepy. I recommended the following at this time: sleep study with potential positive airway pressure titration. (We will score hypopneas at 4% and split the sleep study into diagnostic and treatment portion, if the estimated. 2 hour AHI is >15/h).   I explained the sleep test procedure to the patient and also outlined possible surgical and non-surgical treatment options of OSA, including the use of a custom-made dental device (which would require a referral to a specialist dentist or oral surgeon), upper airway surgical options, such as pillar implants, radiofrequency surgery, tongue base surgery, and UPPP (which would involve a referral to an ENT surgeon). Rarely, jaw surgery such as mandibular advancement may be considered.  I also explained the CPAP treatment option to the patient, who indicated that he would be willing to try CPAP if the need arises. I explained the importance of being compliant with PAP treatment, not only for insurance purposes but primarily to improve His symptoms, and for the patient's long term health benefit, including to reduce His cardiovascular  risks. I answered all his questions today and the patient was in agreement. I would like to see him back after the sleep study is completed and encouraged him to call with any interim questions, concerns, problems or updates.   Thank you very much for allowing me to participate in the care of this nice patient. If I can be of any further assistance to you please do not hesitate to call me at [phone].  Sincerely,   Star Age, MD, PhD

## 2015-06-09 ENCOUNTER — Other Ambulatory Visit: Payer: Self-pay

## 2015-06-09 DIAGNOSIS — E663 Overweight: Secondary | ICD-10-CM

## 2015-06-09 DIAGNOSIS — R0681 Apnea, not elsewhere classified: Secondary | ICD-10-CM

## 2015-06-09 DIAGNOSIS — R351 Nocturia: Secondary | ICD-10-CM

## 2015-06-09 DIAGNOSIS — G4719 Other hypersomnia: Secondary | ICD-10-CM

## 2015-06-09 DIAGNOSIS — R0683 Snoring: Secondary | ICD-10-CM

## 2015-06-11 ENCOUNTER — Encounter (INDEPENDENT_AMBULATORY_CARE_PROVIDER_SITE_OTHER): Payer: Medicare PPO | Admitting: Neurology

## 2015-06-11 DIAGNOSIS — R0681 Apnea, not elsewhere classified: Secondary | ICD-10-CM

## 2015-06-11 DIAGNOSIS — G4719 Other hypersomnia: Secondary | ICD-10-CM

## 2015-06-11 DIAGNOSIS — G4733 Obstructive sleep apnea (adult) (pediatric): Secondary | ICD-10-CM

## 2015-06-11 DIAGNOSIS — R0683 Snoring: Secondary | ICD-10-CM

## 2015-06-11 DIAGNOSIS — R351 Nocturia: Secondary | ICD-10-CM

## 2015-06-11 DIAGNOSIS — E663 Overweight: Secondary | ICD-10-CM

## 2015-06-12 ENCOUNTER — Telehealth: Payer: Self-pay | Admitting: Neurology

## 2015-06-12 DIAGNOSIS — G4733 Obstructive sleep apnea (adult) (pediatric): Secondary | ICD-10-CM

## 2015-06-12 DIAGNOSIS — G4734 Idiopathic sleep related nonobstructive alveolar hypoventilation: Secondary | ICD-10-CM

## 2015-06-12 NOTE — Telephone Encounter (Signed)
Patient referred by Dr. Dagmar Hait, seen by me on 05/26/15, HST on 06/11/15, ins: [facility]  Please call and notify the patient that the recent home sleep test did suggest the diagnosis of severe obstructive sleep apnea and that I recommend treatment for this in the form of CPAP. I will request an overnight sleep study for proper titration and mask fitting. Please explain to patient and arrange for a CPAP titration study. I have placed an order in the chart. Thanks, and please route to [facility] for scheduling.   Star Age, MD, PhD [facility])

## 2015-06-16 NOTE — Telephone Encounter (Signed)
I spoke to patient and he is aware of results and recommendations. He is willing to proceed with titration study.

## 2015-06-24 ENCOUNTER — Telehealth: Payer: Self-pay | Admitting: Neurology

## 2015-06-24 DIAGNOSIS — G4733 Obstructive sleep apnea (adult) (pediatric): Secondary | ICD-10-CM

## 2015-06-24 NOTE — Telephone Encounter (Signed)
I put in pap nap order for patient to complete before CPAP titration study.

## 2015-06-24 NOTE — Telephone Encounter (Signed)
Patient wants a mask fitting before his CPAP titration.  He is very nervous about wearing the mask and would like to try it before the test.

## 2015-07-02 ENCOUNTER — Other Ambulatory Visit: Payer: Medicare PPO

## 2015-07-02 ENCOUNTER — Telehealth: Payer: Self-pay

## 2015-07-02 NOTE — Telephone Encounter (Signed)
Patient came to lab for a mask fitting prior to his sleep study. Nasal pillows was tried first. Patient is a mouth breather. Had hard time breathing with mouth closed. changed to F&P nasal isom medium. Did better with this one. 0 leak. Supine and sides did well. Fitted him also for F&P simplus medium. Did great with this mask. 0 leak  For cpap titration, start with nasal mask. If patient keeps mouth open and does not tolerate change to full face. Educated patient on procedure of sleep study.

## 2015-07-07 ENCOUNTER — Ambulatory Visit (INDEPENDENT_AMBULATORY_CARE_PROVIDER_SITE_OTHER): Payer: Medicare PPO | Admitting: Neurology

## 2015-07-07 DIAGNOSIS — G4733 Obstructive sleep apnea (adult) (pediatric): Secondary | ICD-10-CM | POA: Diagnosis not present

## 2015-07-07 DIAGNOSIS — G4761 Periodic limb movement disorder: Secondary | ICD-10-CM

## 2015-07-07 DIAGNOSIS — G4734 Idiopathic sleep related nonobstructive alveolar hypoventilation: Secondary | ICD-10-CM

## 2015-07-07 DIAGNOSIS — G479 Sleep disorder, unspecified: Secondary | ICD-10-CM

## 2015-07-07 DIAGNOSIS — R9431 Abnormal electrocardiogram [ECG] [EKG]: Secondary | ICD-10-CM

## 2015-07-08 NOTE — Sleep Study (Signed)
Please see the scanned sleep study interpretation located in the Procedure tab within the Chart Review section. 

## 2015-07-09 ENCOUNTER — Telehealth: Payer: Self-pay | Admitting: Neurology

## 2015-07-09 DIAGNOSIS — G4733 Obstructive sleep apnea (adult) (pediatric): Secondary | ICD-10-CM

## 2015-07-09 NOTE — Telephone Encounter (Signed)
Patient referred by Dr. Dagmar Hait, seen by me on 05/26/15, HST on 06/11/15, ins: [employer].  Please call and inform patient that I have entered an order for treatment with positive airway pressure (PAP) treatment of obstructive sleep apnea (OSA). He did well during the latest sleep study with CPAP. We will, therefore, arrange for a machine for home use through a DME (durable medical equipment) company of His choice; and I will see the patient back in follow-up in about 8-10 weeks. Please also explain to the patient that I will be looking out for compliance data, which can be downloaded from the machine (stored on an SD card, that is inserted in the machine) or via remote access through a modem, that is built into the machine. At the time of the followup appointment we will discuss sleep study results and how it is going with PAP treatment at home. Please advise patient to bring His machine at the time of the first FU visit, even though this is cumbersome. Bringing the machine for every visit after that will likely not be needed, but often helps for the first visit to troubleshoot if needed. Please re-enforce the importance of compliance with treatment and the need for [place] to monitor compliance data - often an insurance requirement and actually good feedback for the patient as far as how they are doing.  Also remind patient, that any interim PAP machine or mask issues should be first addressed with the DME company, as they can often help better with technical and mask fit issues. Please ask if patient has a preference regarding DME company.  Please also make sure, the patient has a follow-up appointment with me in about 8-10 weeks from the setup date, thanks.  Once you have spoken to the patient - and faxed/routed report to PCP and referring MD (if other than PCP), you can close this encounter, thanks,   Star Age, MD, PhD [facility] ([place])

## 2015-07-09 NOTE — Telephone Encounter (Signed)
LM for patient to call back.

## 2015-07-10 NOTE — Telephone Encounter (Signed)
Patient returned Diana's call, please call [phone].

## 2015-07-13 NOTE — Telephone Encounter (Signed)
Patient is calling back about results of sleep test.  Please call [phone].  Thanks!

## 2015-07-14 ENCOUNTER — Encounter: Payer: Self-pay | Admitting: *Deleted

## 2015-07-14 NOTE — Telephone Encounter (Signed)
Pt called office back. Went over sleep study results per Dr Rexene Alberts note. Pt aware he will be receiving letter in mail regarding information. Made f/u appt 09/09/15 at 1030am w/ Dr Rexene Alberts. I sent staff message via EPIC to Gracy Racer [facility]). Sent sleep study results to PCP (who was referring provider) via EPIC.  He would like copy of report. He will pick up in the office. He must sign a medical release form. Placed envelope with results up front for pick up.

## 2015-07-14 NOTE — Telephone Encounter (Signed)
[name]. Gave [facility] phone number

## 2015-07-28 ENCOUNTER — Encounter: Payer: Self-pay | Admitting: *Deleted

## 2015-09-09 ENCOUNTER — Encounter: Payer: Self-pay | Admitting: Neurology

## 2015-09-09 ENCOUNTER — Ambulatory Visit (INDEPENDENT_AMBULATORY_CARE_PROVIDER_SITE_OTHER): Payer: Medicare PPO | Admitting: Neurology

## 2015-09-09 VITALS — BP 132/66 | HR 62 | Resp 16 | Ht 69.25 in | Wt 194.0 lb

## 2015-09-09 DIAGNOSIS — G4733 Obstructive sleep apnea (adult) (pediatric): Secondary | ICD-10-CM

## 2015-09-09 DIAGNOSIS — Z9989 Dependence on other enabling machines and devices: Principal | ICD-10-CM

## 2015-09-09 NOTE — Patient Instructions (Signed)

## 2015-09-09 NOTE — Progress Notes (Signed)
Subjective:    Patient ID: [name] is a 81 y.o. male.  HPI     Interim history:   [name] is a very pleasant 81 year old right-handed gentleman with an underlying medical history of hypertension, hyperlipidemia, degenerative joint disease, prostate cancer, status post radical prostatectomy 1997, history of skin cancer, allergic rhinitis, arthritis, status post left total knee arthroplasty under [name] in 2012, and overweight state, who presents for follow-up consultation of his obstructive sleep apnea, after his recent home sleep test. The patient is unaccompanied today. I first met him on 05/26/2015 at the request of his primary care physician, at which time he reported snoring and excessive daytime somnolence as well as a family history of obstructive sleep apnea. He had a home sleep test on 06/11/2015 which showed severe obstructive sleep apnea, overall AHI was 34, oxygen desaturation nadir was 79%, time below 88% saturation was 3 hours and 46 minutes for the night. I invited him back for a full night CPAP titration study based on the severity of his test results from the home sleep test. He had a CPAP titration study on 07/07/2015. Sleep efficiency was 72.9%, sleep latency 13 minutes, wake after sleep onset was 108 minutes with mild to moderate sleep fragmentation noted. He had an elevated arousal index, he had an increased percentage of stage II sleep, absence of slow-wave sleep and an increased percentage of REM sleep at 26%, REM latency prolonged at 166.5 minutes. He had severe PLMS with an index of 68 per hour, arousal index from PLMS was 16.5 per hour. He had frequent PACs on EKG. Average oxygen saturation was 88% but improved to about 91% on the final CPAP pressure. CPAP was titrated from 5 cm to 10 cm. AHI was 0 per hour on the pressure of 10 cm. Based on his test results are prescribed CPAP therapy for home use.   Today, 09/09/2015: I reviewed his CPAP compliance data from  08/09/2015 through 09/07/2015 which is a total of 30 days during which time he used his machine every night with percent used days greater than 4 hours at 100%, indicating superb compliance with an average usage of 7 hours and 47 minutes, residual AHI 3.2 per hour, leak at times high with the 95th percentile at 29.5 L/m on a pressure of 10 cm with EPR of 3.   Today, 09/09/2015: He reports that he is doing okay with CPAP. He does not feel very comfortable with the nasal pillows and does not like to use the chin strap. He got in touch with his DME company and the representative apparently told him that he would not be able to tell use a fullface mask. He does note that he opens his mouth and mouth breathes. He has nasal congestion. The nasal pillows actually are themselves quite comfortable but the fact that he opens his mouth causes mouth dryness and the mouth opening causes the leak. snoring is improved, nocturia better as well. He is tolerating the pressure and is motivated to continue with treatment.   Previously:   05/26/2015: He reports snoring and excessive daytime somnolence. He denies a FHx of OSA. His Epworth sleepiness score is 2 out of 24 today, his fatigue score is 2 out of 63 today. He reports having had a sleep study several years ago may be 10+ years ago and per his report it was borderline at the time. He has irregular breathing and brief pauses, per wife's report, and snorting sounds per son's feedback.  He denies morning headaches, but has nocturia 2-3 times per night. He has started having some L knee discomfort but denies RLS symptoms.   He tries to exercise at the [facility] and plays Golf about 3 days a week.   He drinks caffeine in the form of coffee, about 2 cups a day. He drinks about 2 mixed drinks per day, he is a nonsmoker. He retired from [name] after 40 years. He has 3 children, 7 grandchildren. He lives with his wife. He does not have trouble falling asleep  or staying asleep. I reviewed your office note from 10/22/2014, which you kindly included.  His Past Medical History Is Significant For: Past Medical History  Diagnosis Date  . HTN (hypertension)   . Other and unspecified hyperlipidemia   . GERD (gastroesophageal reflux disease)   . History of colon polyps     adenoma;[name]  . PAC (premature atrial contraction)   . Colon polyp   . History of prostate cancer   . History of skin cancer     His Past Surgical History Is Significant For: Past Surgical History  Procedure Laterality Date  . Hemorroidectomy    . Rotator cuff repair       X 3(L twice); [name]  . Prostatectomy  1997    [name]  . Total knee arthroplasty      L; [facility]  . Colonoscopy w/ polypectomy  last 2013     X 2; [place] [facility]  . Prostate surgery      His Family History Is Significant For: Family History  Problem Relation Age of Onset  . Stroke Mother 47  . Heart disease Father 20    ?MI  . Heart disease Brother     CBAG in 35s  . Dementia Brother     vascular dementia  . Colon cancer Neg Hx   . Stomach cancer Neg Hx   . Diabetes Neg Hx   . Cancer Neg Hx     His Social History Is Significant For: Social History   Social History  . Marital Status: Married    Spouse Name: N/A  . Number of Children: 3  . Years of Education: BA   Occupational History  . Retired    Social History Main Topics  . Smoking status: Never Smoker   . Smokeless tobacco: Never Used  . Alcohol Use: 8.4 oz/week    14 Shots of liquor per week     Comment:  14 drinks /week  . Drug Use: No  . Sexual Activity: Not Asked   Other Topics Concern  . None   Social History Narrative   Drinks 2 cups of coffee a day     His Allergies Are:  No Known Allergies:   His Current Medications Are:  Outpatient Encounter Prescriptions as of 09/09/2015  Medication Sig  . amLODipine (NORVASC) 10 MG tablet Take 1 tablet (10 mg total) by mouth daily.  Marland Kitchen aspirin 81 MG tablet  Take 160 mg by mouth daily.  Marland Kitchen atorvastatin (LIPITOR) 20 MG tablet TAKE ONE AND ONE-HALF TABLETS DAILY  . benazepril (LOTENSIN) 40 MG tablet Take 1 tablet (40 mg total) by mouth daily.  . celecoxib (CELEBREX) 200 MG capsule Take 1 capsule (200 mg total) by mouth 2 (two) times daily.  Marland Kitchen glucosamine-chondroitin 500-400 MG tablet Take 1 tablet by mouth daily.  . Loratadine (CLARITIN) 10 MG CAPS Take by mouth.  . metoprolol (LOPRESSOR) 50 MG tablet 1/2  by mouth two times daily  . Multiple Vitamin (MULTIVITAMIN) tablet Take 1 tablet by mouth daily.  Marland Kitchen NEXIUM 24HR 20 MG capsule TAKE (1) CAPSULE DAILY.  Marland Kitchen temazepam (RESTORIL) 15 MG capsule Take 15 mg by mouth at bedtime as needed for sleep.   No facility-administered encounter medications on file as of 09/09/2015.  :  Review of Systems:  Out of a complete 14 point review of systems, all are reviewed and negative with the exception of these symptoms as listed below:   Review of Systems  Neurological:       Patient reports that he is doing ok with CPAP. He is currently using nasal pillows, states it is not very comfortable. He spoke to [facility] about getting a new mask, they told him that a FFM would not fit him. Patient does not like using the chin strap.     Objective:  Neurologic Exam  Physical Exam Physical Examination:   Filed Vitals:   09/09/15 1041  BP: 132/66  Pulse: 62  Resp: 16   General Examination: The patient is a very pleasant 81 y.o. male in no acute distress. He appears well-developed and well-nourished and very well groomed. He is in good spirits today.  HEENT: Normocephalic, atraumatic, pupils are equal, round and reactive to light and accommodation. He has b/l cataracts. Extraocular tracking is good without limitation to gaze excursion or nystagmus noted. Normal smooth pursuit is noted. Hearing is grossly intact. Face is symmetric with normal facial animation and normal facial sensation. Speech is clear with no dysarthria  noted. There is no hypophonia. There is no lip, neck/head, jaw or voice tremor. Neck is supple with full range of passive and active motion. There are no carotid bruits on auscultation. Oropharynx exam reveals: mild mouth dryness, adequate dental hygiene and moderate airway crowding, due to thicker soft palate, redundant soft palate, large uvula, tonsils may be absent or small. Mallampati is class II. Tongue protrudes centrally and palate elevates symmetrically. ess and no septal deviation.   Chest: Clear to auscultation without wheezing, rhonchi or crackles noted.  Heart: S1+S2+0, regular and normal without murmurs, rubs or gallops noted.   Abdomen: Soft, non-tender and non-distended with normal bowel sounds appreciated on auscultation.  Extremities: There is no pitting edema in the distal lower extremities bilaterally. Pedal pulses are intact.  Skin: Warm and dry without trophic changes noted. There are mild varicose veins.  Musculoskeletal: exam reveals no obvious joint deformities, tenderness or joint swelling or erythema.   Neurologically:  Mental status: The patient is awake, alert and oriented in all 4 spheres. His immediate and remote memory, attention, language skills and fund of knowledge are appropriate. There is no evidence of aphasia, agnosia, apraxia or anomia. Speech is clear with normal prosody and enunciation. Thought process is linear. Mood is normal and affect is normal.  Cranial nerves II - XII are as described above under HEENT exam. In addition: shoulder shrug is normal with equal shoulder height noted. Motor exam: Normal bulk, strength and tone is noted. There is no drift, tremor or rebound. Romberg is negative. Reflexes are 1+ throughout. Fine motor skills and coordination: intact with normal finger taps, normal hand movements, normal rapid alternating patting, normal foot taps and normal foot agility.  Cerebellar testing: No dysmetria or intention tremor on finger to nose  testing. Heel to shin is unremarkable bilaterally. There is no truncal or gait ataxia.  Sensory exam: intact to light touch in the upper and lower extremities.  Gait,  station and balance: He stands easily. No veering to one side is noted. No leaning to one side is noted. Posture is age-appropriate and stance is narrow based. Gait shows normal stride length and normal pace. No problems turning are noted. He turns en bloc.  Assessment and Plan:  In summary, KENIEL RALSTON is a very pleasant 81 year old male with an underlying medical history of hypertension, hyperlipidemia, degenerative joint disease, prostate cancer, status post radical prostatectomy 1997, history of skin cancer, allergic rhinitis, arthritis, status post left total knee arthroplasty under [name] in 2012, and overweight state, whopresents for follow-up consultation of his obstructive sleep apnea which was determined to be severe by home sleep testing in March 2017. He then return for a full night CPAP titration study on 07/07/2015. We talked about his test results in detail today. He has done quite well with CPAP therapy and is fully compliant with treatment. He is highly commended for this. As far as improvements, he has noted better sleep consolidation, less nocturia, and elimination of snoring. He is motivated to continue with treatment but has had some struggles with air leaking from the mouth because he is a mouth breather. Chinstrap was too uncomfortable. He tried a nasal mask which he did not like. We will get him checked out again for a possibility of using a fullface mask. He will talk to our sleep lab manager, Shirlean Mylar in that regard. From my end of things, we talked about the importance of being compliant with CPAP therapy and the reason why we treat sleep apnea, in particular to reduce cardiovascular disease risks. I answered all his questions today and at this juncture I can see him back in 6 months, sooner if neede. The patient was  in agreement.  I spent 25 minutes in total face-to-face time with the patient, more than 50% of which was spent in counseling and coordination of care, reviewing test results, reviewing medication and discussing or reviewing the diagnosis of OSA, its prognosis and treatment options.

## 2016-03-08 ENCOUNTER — Encounter: Payer: Self-pay | Admitting: Neurology

## 2016-03-10 ENCOUNTER — Encounter: Payer: Self-pay | Admitting: Neurology

## 2016-03-10 ENCOUNTER — Ambulatory Visit (INDEPENDENT_AMBULATORY_CARE_PROVIDER_SITE_OTHER): Payer: Medicare PPO | Admitting: Neurology

## 2016-03-10 VITALS — BP 130/72 | HR 46 | Resp 20 | Ht 70.0 in | Wt 198.0 lb

## 2016-03-10 DIAGNOSIS — Z9989 Dependence on other enabling machines and devices: Secondary | ICD-10-CM | POA: Diagnosis not present

## 2016-03-10 DIAGNOSIS — G4733 Obstructive sleep apnea (adult) (pediatric): Secondary | ICD-10-CM | POA: Diagnosis not present

## 2016-03-10 NOTE — Patient Instructions (Signed)

## 2016-03-10 NOTE — Progress Notes (Signed)
Subjective:    Patient ID: [name] is a 81 y.o. male.  HPI    Interim history:   [name] is a very pleasant 81 year old right-handed gentleman with an underlying medical history of hypertension, hyperlipidemia, degenerative joint disease, prostate cancer, status post radical prostatectomy 1997, history of skin cancer, allergic rhinitis, arthritis, status post left total knee arthroplasty under Dr. Maureen Ralphs in 2012, and overweight state, who presents for follow-up consultation of his obstructive sleep apnea, on CPAP therapy. The patient is unaccompanied today. I last saw him on 09/09/2015, at which time he reported that he was doing okay with CPAP. He had some discomfort with a nasal pillows and did not like the chin strap. He had some nasal congestion. He had some mouth dryness. He was compliant with CPAP therapy. Today, 03/10/2016: I reviewed his CPAP compliance data from 02/08/2016 through 03/08/2016 which is a total of 30 days, during which time he used his machine every night with percent used days greater than 4 hours at 100%, indicating superb compliance with an average usage of 7 hours and 42 minutes, residual AHI 2 per hour, leak on the high side with the 95th percentile at 32.3 L/m on a pressure of 10 cm with EPR of 1.  Today, 03/10/2016: He reports doing well with CPAP. He has been sleeping fairly well with it. He has been using large nasal pillows. He has adapted to treatment well. He also tried a chinstrap and a larger chinstrap, called halo. He has been fully compliant with treatment, feels that sleep has been better. Has no new complaints. Has had some residual issues with mouth opening at night and mouth dryness. Stays active mentally and physically. Exercises on a regular basis, could do better with oral hydration he admits.  Previously:    I first met him on 05/26/2015 at the request of his primary care physician, at which time he reported snoring and excessive daytime somnolence  as well as a family history of obstructive sleep apnea. He had a home sleep test on 06/11/2015 which showed severe obstructive sleep apnea, overall AHI was 34, oxygen desaturation nadir was 79%, time below 88% saturation was 3 hours and 46 minutes for the night. I invited him back for a full night CPAP titration study based on the severity of his test results from the home sleep test. He had a CPAP titration study on 07/07/2015. Sleep efficiency was 72.9%, sleep latency 13 minutes, wake after sleep onset was 108 minutes with mild to moderate sleep fragmentation noted. He had an elevated arousal index, he had an increased percentage of stage II sleep, absence of slow-wave sleep and an increased percentage of REM sleep at 26%, REM latency prolonged at 166.5 minutes. He had severe PLMS with an index of 68 per hour, arousal index from PLMS was 16.5 per hour. He had frequent PACs on EKG. Average oxygen saturation was 88% but improved to about 91% on the final CPAP pressure. CPAP was titrated from 5 cm to 10 cm. AHI was 0 per hour on the pressure of 10 cm. Based on his test results are prescribed CPAP therapy for home use.    I reviewed his CPAP compliance data from 08/09/2015 through 09/07/2015 which is a total of 30 days during which time he used his machine every night with percent used days greater than 4 hours at 100%, indicating superb compliance with an average usage of 7 hours and 47 minutes, residual AHI 3.2 per hour, leak at  times high with the 95th percentile at 29.5 L/m on a pressure of 10 cm with EPR of 3.    05/26/2015: He reports snoring and excessive daytime somnolence. He denies a FHx of OSA. His Epworth sleepiness score is 2 out of 24 today, his fatigue score is 2 out of 63 today. He reports having had a sleep study several years ago may be 10+ years ago and per his report it was borderline at the time. He has irregular breathing and brief pauses, per wife's report, and snorting sounds per son's  feedback.   He denies morning headaches, but has nocturia 2-3 times per night. He has started having some L knee discomfort but denies RLS symptoms.   He tries to exercise at the [facility] and plays Golf about 3 days a week.   He drinks caffeine in the form of coffee, about 2 cups a day. He drinks about 2 mixed drinks per day, he is a nonsmoker. He retired from [employer] after 40 years. He has 3 children, 7 grandchildren. He lives with his wife. He does not have trouble falling asleep or staying asleep. I reviewed your office note from 10/22/2014, which you kindly included.  His Past Medical History Is Significant For: Past Medical History:  Diagnosis Date  . Colon polyp   . GERD (gastroesophageal reflux disease)   . History of colon polyps    adenoma;[facility]  . History of prostate cancer   . History of skin cancer   . HTN (hypertension)   . Other and unspecified hyperlipidemia   . PAC (premature atrial contraction)     His Past Surgical History Is Significant For: Past Surgical History:  Procedure Laterality Date  . COLONOSCOPY W/ POLYPECTOMY  last 2013    X 2; [facility]  . HEMORROIDECTOMY    . PROSTATE SURGERY    . PROSTATECTOMY  1997   Dr Rosana Hoes  . ROTATOR CUFF REPAIR      X 3(L twice); Dr Theda Sers  . TOTAL KNEE ARTHROPLASTY     L; Dr Maureen Ralphs    His Family History Is Significant For: Family History  Problem Relation Age of Onset  . Stroke Mother 10  . Heart disease Father 48    ?MI  . Heart disease Brother     CBAG in 37s  . Dementia Brother     vascular dementia  . Colon cancer Neg Hx   . Stomach cancer Neg Hx   . Diabetes Neg Hx   . Cancer Neg Hx     His Social History Is Significant For: Social History   Social History  . Marital status: Married    Spouse name: N/A  . Number of children: 3  . Years of education: BA   Occupational History  . Retired    Social History Main Topics  . Smoking status: Never Smoker  . Smokeless tobacco:  Never Used  . Alcohol use 8.4 oz/week    14 Shots of liquor per week     Comment:  14 drinks /week  . Drug use: No  . Sexual activity: Not Asked   Other Topics Concern  . None   Social History Narrative   Drinks 2 cups of coffee a day     His Allergies Are:  No Known Allergies:   His Current Medications Are:  Outpatient Encounter Prescriptions as of 03/10/2016  Medication Sig  . amLODipine (NORVASC) 10 MG tablet Take 1 tablet (10 mg total) by  mouth daily.  Marland Kitchen aspirin 81 MG tablet Take 160 mg by mouth daily.  Marland Kitchen atorvastatin (LIPITOR) 20 MG tablet TAKE ONE AND ONE-HALF TABLETS DAILY  . benazepril (LOTENSIN) 40 MG tablet Take 1 tablet (40 mg total) by mouth daily.  . celecoxib (CELEBREX) 200 MG capsule Take 1 capsule (200 mg total) by mouth 2 (two) times daily.  Marland Kitchen glucosamine-chondroitin 500-400 MG tablet Take 1 tablet by mouth daily.  . Loratadine (CLARITIN) 10 MG CAPS Take by mouth.  . metoprolol (LOPRESSOR) 50 MG tablet 1/2 by mouth two times daily  . Multiple Vitamin (MULTIVITAMIN) tablet Take 1 tablet by mouth daily.  Marland Kitchen NEXIUM 24HR 20 MG capsule TAKE (1) CAPSULE DAILY.  Marland Kitchen temazepam (RESTORIL) 15 MG capsule Take 15 mg by mouth at bedtime as needed for sleep.   No facility-administered encounter medications on file as of 03/10/2016.   :  Review of Systems:  Out of a complete 14 point review of systems, all are reviewed and negative with the exception of these symptoms as listed below:  Review of Systems  Neurological:       Pt presents today for his cpap compliance visit. Pt says that he has been using his cpap every night. Pt is using [facility] as his DME. [facility] sent him 3 sizes of nasal pillows, even though he only uses a large. He wants to return the small and medium.    Objective:  Neurologic Exam  Physical Exam Physical Examination:   Vitals:   03/10/16 1149  BP: 130/72  Pulse: (!) 46  Resp: 20   General Examination: The patient is a very pleasant 81 y.o. male in no  acute distress. He appears well-developed and well-nourished and very well groomed. He is in good spirits today.  HEENT: Normocephalic, atraumatic, pupils are equal, round and reactive to light and accommodation. He has b/l cataracts. Extraocular tracking is good without limitation to gaze excursion or nystagmus noted. Normal smooth pursuit is noted. Hearing is grossly intact. Face is symmetric with normal facial animation and normal facial sensation. Speech is clear with no dysarthria noted. There is no hypophonia. There is no lip, neck/head, jaw or voice tremor. Neck is supple with full range of passive and active motion. There are no carotid bruits on auscultation. Oropharynx exam reveals: mild mouth dryness, adequate dental hygiene and moderate airway crowding, due to thicker soft palate, redundant soft palate, large uvula, tonsils absent (?) or small. Mallampati is class II. Tongue protrudes centrally and palate elevates symmetrically.   Chest: Clear to auscultation without wheezing, rhonchi or crackles noted.  Heart: S1+S2+0, regular and normal without murmurs, rubs or gallops noted.   Abdomen: Soft, non-tender and non-distended with normal bowel sounds appreciated on auscultation.  Extremities: There is no pitting edema in the distal lower extremities bilaterally. Pedal pulses are intact.  Skin: Warm and dry without trophic changes noted. There are mild varicose veins.  Musculoskeletal: exam reveals no obvious joint deformities, tenderness or joint swelling or erythema.   Neurologically:  Mental status: The patient is awake, alert and oriented in all 4 spheres. His immediate and remote memory, attention, language skills and fund of knowledge are appropriate. There is no evidence of aphasia, agnosia, apraxia or anomia. Speech is clear with normal prosody and enunciation. Thought process is linear. Mood is normal and affect is normal.  Cranial nerves II - XII are as described above under HEENT  exam. In addition: shoulder shrug is normal with equal shoulder height noted. Motor exam:  Normal bulk, strength and tone is noted. There is no drift, tremor or rebound. Romberg is negative. Reflexes are 1+ throughout. Fine motor skills and coordination: intact with normal finger taps, normal hand movements, normal rapid alternating patting, normal foot taps and normal foot agility.  Cerebellar testing: No dysmetria or intention tremor on finger to nose testing.  Sensory exam: intact to light touch in the upper and lower extremities.  Gait, station and balance: He stands easily. No veering to one side is noted. No leaning to one side is noted. Posture is age-appropriate and stance is narrow based. Gait shows normal stride length and normal pace. No problems turning are noted.   Assessment and Plan:  In summary, OUSMANE SEEMAN is a very pleasant 81 year old male with an underlying medical history of hypertension, hyperlipidemia, degenerative joint disease, prostate cancer, status post radical prostatectomy 1997, history of skin cancer, allergic rhinitis, arthritis, status post left total knee arthroplasty under Dr. Maureen Ralphs in 2012, and overweight state, who presents for follow-up consultation of his obstructive sleep apnea which was determined to be severe by a home sleep test in March 2017. His overall AHI was 34.9 per hour. He was advised to return for a full night CPAP titration study, which he had on 07/07/2015. We talked about his test results  previously in detail. He has since then been on CPAP therapy and has done rather well. He has had excellent compliance and no significant complaints as far as mask tolerance and pressure but has tried different interfaces. He has settled on large nasal pillows with a chinstrap. He does have some residual mouth leaking due to mouth opening despite using a halo type chinstrap which he demonstrated today. Physical exam and neurological exam are stable. He is advised about  his most recent compliance data and we reviewed the numbers today. From my end of things he is doing rather well. I am very pleased with his overall progress. I asked him to return in one year routinely for sleep apnea checkup, sooner as needed. I answered all his questions today and he was in agreement.  I spent 25 minutes in total face-to-face time with the patient, more than 50% of which was spent in counseling and coordination of care, reviewing test results, reviewing medication and discussing or reviewing the diagnosis of OSA, its prognosis and treatment options.

## 2016-04-14 ENCOUNTER — Encounter: Payer: Self-pay | Admitting: Gastroenterology

## 2016-04-19 ENCOUNTER — Encounter: Payer: Self-pay | Admitting: Gastroenterology

## 2016-04-20 DIAGNOSIS — G4733 Obstructive sleep apnea (adult) (pediatric): Secondary | ICD-10-CM | POA: Diagnosis not present

## 2016-04-26 ENCOUNTER — Other Ambulatory Visit: Payer: Self-pay | Admitting: Dermatology

## 2016-04-26 DIAGNOSIS — D492 Neoplasm of unspecified behavior of bone, soft tissue, and skin: Secondary | ICD-10-CM | POA: Diagnosis not present

## 2016-04-26 DIAGNOSIS — L570 Actinic keratosis: Secondary | ICD-10-CM | POA: Diagnosis not present

## 2016-04-26 DIAGNOSIS — D229 Melanocytic nevi, unspecified: Secondary | ICD-10-CM | POA: Diagnosis not present

## 2016-04-26 DIAGNOSIS — L820 Inflamed seborrheic keratosis: Secondary | ICD-10-CM | POA: Diagnosis not present

## 2016-05-21 DIAGNOSIS — G4733 Obstructive sleep apnea (adult) (pediatric): Secondary | ICD-10-CM | POA: Diagnosis not present

## 2016-06-01 ENCOUNTER — Encounter: Payer: Self-pay | Admitting: Gastroenterology

## 2016-06-01 ENCOUNTER — Ambulatory Visit (INDEPENDENT_AMBULATORY_CARE_PROVIDER_SITE_OTHER): Payer: Medicare HMO | Admitting: Gastroenterology

## 2016-06-01 VITALS — BP 130/64 | HR 68 | Ht 69.25 in | Wt 198.4 lb

## 2016-06-01 DIAGNOSIS — Z86010 Personal history of colonic polyps: Secondary | ICD-10-CM

## 2016-06-01 DIAGNOSIS — K5900 Constipation, unspecified: Secondary | ICD-10-CM | POA: Diagnosis not present

## 2016-06-01 DIAGNOSIS — R194 Change in bowel habit: Secondary | ICD-10-CM | POA: Diagnosis not present

## 2016-06-01 NOTE — Progress Notes (Signed)
History of Present Illness: This is an 82 year old male referred by Avva, Ravisankar, MD for the evaluation of a change in bowel habits, mild constipation and a personal history of precancerous colon polyps. He is very active and healthy. He notes a slight change in his bowel pattern over the past year or so from daily bowel movements to every other day bowel movements and occasionally hard stools. No other gastrointestinal complaints. He would like to proceed with surveillance colonoscopy as previously recommended. Denies weight loss, abdominal pain, diarrhea, change in stool caliber, melena, hematochezia, nausea, vomiting, dysphagia, reflux symptoms, chest pain.   No Known Allergies Outpatient Medications Prior to Visit  Medication Sig Dispense Refill  . amLODipine (NORVASC) 10 MG tablet Take 1 tablet (10 mg total) by mouth daily. 90 tablet 3  . aspirin 81 MG tablet Take 160 mg by mouth daily.    Marland Kitchen atorvastatin (LIPITOR) 20 MG tablet TAKE ONE AND ONE-HALF TABLETS DAILY 135 tablet 3  . benazepril (LOTENSIN) 40 MG tablet Take 1 tablet (40 mg total) by mouth daily. 90 tablet 3  . celecoxib (CELEBREX) 200 MG capsule Take 1 capsule (200 mg total) by mouth 2 (two) times daily. 180 capsule 3  . glucosamine-chondroitin 500-400 MG tablet Take 1 tablet by mouth daily.    . Loratadine (CLARITIN) 10 MG CAPS Take by mouth.    . metoprolol (LOPRESSOR) 50 MG tablet 1/2 by mouth two times daily 90 tablet 3  . Multiple Vitamin (MULTIVITAMIN) tablet Take 1 tablet by mouth daily.    Marland Kitchen NEXIUM 24HR 20 MG capsule TAKE (1) CAPSULE DAILY. 30 capsule 11  . temazepam (RESTORIL) 15 MG capsule Take 15 mg by mouth at bedtime as needed for sleep.     No facility-administered medications prior to visit.    Past Medical History:  Diagnosis Date  . Colon polyp   . GERD (gastroesophageal reflux disease)   . History of colon polyps    adenoma;[facility]  . History of prostate cancer   . History of skin cancer   .  HTN (hypertension)   . Other and unspecified hyperlipidemia   . PAC (premature atrial contraction)    Past Surgical History:  Procedure Laterality Date  . COLONOSCOPY W/ POLYPECTOMY  last 2013    X 2; [facility]  . HEMORROIDECTOMY    . PROSTATE SURGERY    . PROSTATECTOMY  1997   Dr Rosana Hoes  . ROTATOR CUFF REPAIR      X 3(L twice); Dr Theda Sers  . TOTAL KNEE ARTHROPLASTY     L; Dr Maureen Ralphs   Social History   Social History  . Marital status: Married    Spouse name: N/A  . Number of children: 3  . Years of education: BA   Occupational History  . Retired    Social History Main Topics  . Smoking status: Never Smoker  . Smokeless tobacco: Never Used  . Alcohol use 8.4 oz/week    14 Shots of liquor per week     Comment:  14 drinks /week  . Drug use: No  . Sexual activity: Not Asked   Other Topics Concern  . None   Social History Narrative   Drinks 2 cups of coffee a day    Family History  Problem Relation Age of Onset  . Stroke Mother 68  . Heart disease Father 17    ?MI  . Heart disease Brother     CBAG in 44s  .  Dementia Brother     vascular dementia  . Colon cancer Neg Hx   . Stomach cancer Neg Hx   . Diabetes Neg Hx   . Cancer Neg Hx       Review of Systems: Pertinent positive and negative review of systems were noted in the above HPI section. All other review of systems were otherwise negative.   Physical Exam: General: Well developed, well nourished, no acute distress Head: Normocephalic and atraumatic Eyes:  sclerae anicteric, EOMI Ears: Normal auditory acuity Mouth: No deformity or lesions Neck: Supple, no masses or thyromegaly Lungs: Clear throughout to auscultation Heart: Regular rate and rhythm; no murmurs, rubs or bruits Abdomen: Soft, non tender and non distended. No masses, hepatosplenomegaly or hernias noted. Normal Bowel sounds Rectal: deferred to colonoscopy Musculoskeletal: Symmetrical with no gross deformities  Skin: No lesions on  visible extremities Pulses:  Normal pulses noted Extremities: No clubbing, cyanosis, edema or deformities noted Neurological: Alert oriented x 4, grossly nonfocal Cervical Nodes:  No significant cervical adenopathy Inguinal Nodes: No significant inguinal adenopathy Psychological:  Alert and cooperative. Normal mood and affect  Assessment and Recommendations:  1. Change in bowel habits, mild constipation. Increase dietary fiber and water intake including the use of prune juice. May also try Colace daily or fiber supplement such as Benefiber daily.  2. Personal history of precancerous colon polyps. He is due for a five-year surveillance colonoscopy his health is very good and he is very interested in proceeding with surveillance colonoscopy. The risks (including bleeding, perforation, infection, missed lesions, medication reactions and possible hospitalization or surgery if complications occur), benefits, and alternatives to colonoscopy with possible biopsy and possible polypectomy were discussed with the patient and they consent to proceed.    cc: Prince Solian, MD [address] [place], [place] [address]

## 2016-06-01 NOTE — Patient Instructions (Signed)
You have been scheduled for a colonoscopy. Please follow written instructions given to you at your visit today.  Please pick up your prep supplies at the pharmacy within the next 1-3 days. If you use inhalers (even only as needed), please bring them with you on the day of your procedure. Your physician has requested that you go to www.startemmi.com and enter the access code given to you at your visit today. This web site gives a general overview about your procedure. However, you should still follow specific instructions given to you by our office regarding your preparation for the procedure.   High-Fiber Diet Fiber, also called dietary fiber, is a type of carbohydrate found in fruits, vegetables, whole grains, and beans. A high-fiber diet can have many health benefits. Your health care provider may recommend a high-fiber diet to help:  Prevent constipation. Fiber can make your bowel movements more regular.  Lower your cholesterol.  Relieve hemorrhoids, uncomplicated diverticulosis, or irritable bowel syndrome.  Prevent overeating as part of a weight-loss plan.  Prevent heart disease, type 2 diabetes, and certain cancers. What is my plan? The recommended daily intake of fiber includes:  38 grams for men under age 4.  56 grams for men over age 74.  42 grams for women under age 23.  51 grams for women over age 28. You can get the recommended daily intake of dietary fiber by eating a variety of fruits, vegetables, grains, and beans. Your health care provider may also recommend a fiber supplement if it is not possible to get enough fiber through your diet. What do I need to know about a high-fiber diet?  Fiber supplements have not been widely studied for their effectiveness, so it is better to get fiber through food sources.  Always check the fiber content on thenutrition facts label of any prepackaged food. Look for foods that contain at least 5 grams of fiber per serving.  Ask your  dietitian if you have questions about specific foods that are related to your condition, especially if those foods are not listed in the following section.  Increase your daily fiber consumption gradually. Increasing your intake of dietary fiber too quickly may cause bloating, cramping, or gas.  Drink plenty of water. Water helps you to digest fiber. What foods can I eat? Grains  Whole-grain breads. Multigrain cereal. Oats and oatmeal. Brown rice. Barley. Bulgur wheat. [place]. Bran muffins. Popcorn. Rye wafer crackers. Vegetables  Sweet potatoes. Spinach. Kale. Artichokes. Cabbage. Broccoli. Green peas. Carrots. Squash. Fruits  Berries. Pears. Apples. Oranges. Avocados. Prunes and raisins. Dried figs. Meats and Other Protein Sources  Navy, kidney, pinto, and soy beans. Split peas. Lentils. Nuts and seeds. Dairy  Fiber-fortified yogurt. Beverages  Fiber-fortified soy milk. Fiber-fortified orange juice. Other  Fiber bars. The items listed above may not be a complete list of recommended foods or beverages. Contact your dietitian for more options.  What foods are not recommended? Grains  White bread. Pasta made with refined flour. White rice. Vegetables  Fried potatoes. Canned vegetables. Well-cooked vegetables. Fruits  Fruit juice. Cooked, strained fruit. Meats and Other Protein Sources  Fatty cuts of meat. Fried Sales executive or fried fish. Dairy  Milk. Yogurt. Cream cheese. Sour cream. Beverages  Soft drinks. Other  Cakes and pastries. Butter and oils. The items listed above may not be a complete list of foods and beverages to avoid. Contact your dietitian for more information.  What are some tips for including high-fiber foods in my diet?  Eat a  wide variety of high-fiber foods.  Make sure that half of all grains consumed each day are whole grains.  Replace breads and cereals made from refined flour or white flour with whole-grain breads and cereals.  Replace white rice with  brown rice, bulgur wheat, or millet.  Start the day with a breakfast that is high in fiber, such as a cereal that contains at least 5 grams of fiber per serving.  Use beans in place of meat in soups, salads, or pasta.  Eat high-fiber snacks, such as berries, raw vegetables, nuts, or popcorn. This information is not intended to replace advice given to you by your health care provider. Make sure you discuss any questions you have with your health care provider. Document Released: 03/21/2005 Document Revised: 08/27/2015 Document Reviewed: 09/03/2013 Elsevier Interactive Patient Education  2017 [place] you for choosing me and [facility].  Pricilla Riffle. Dagoberto Ligas., MD., [facility]

## 2016-06-01 NOTE — Addendum Note (Signed)
Addended by: Marzella Schlein on: 06/01/2016 12:17 PM   Modules accepted: Orders

## 2016-06-15 ENCOUNTER — Encounter: Payer: Self-pay | Admitting: Gastroenterology

## 2016-06-15 ENCOUNTER — Ambulatory Visit (AMBULATORY_SURGERY_CENTER): Payer: Medicare HMO | Admitting: Gastroenterology

## 2016-06-15 VITALS — BP 116/60 | HR 48 | Temp 96.2°F | Resp 10 | Ht 69.25 in | Wt 198.0 lb

## 2016-06-15 DIAGNOSIS — G4733 Obstructive sleep apnea (adult) (pediatric): Secondary | ICD-10-CM | POA: Diagnosis not present

## 2016-06-15 DIAGNOSIS — Z860100 Personal history of colon polyps, unspecified: Secondary | ICD-10-CM

## 2016-06-15 DIAGNOSIS — D125 Benign neoplasm of sigmoid colon: Secondary | ICD-10-CM | POA: Diagnosis not present

## 2016-06-15 DIAGNOSIS — K635 Polyp of colon: Secondary | ICD-10-CM

## 2016-06-15 DIAGNOSIS — E669 Obesity, unspecified: Secondary | ICD-10-CM | POA: Diagnosis not present

## 2016-06-15 DIAGNOSIS — K219 Gastro-esophageal reflux disease without esophagitis: Secondary | ICD-10-CM | POA: Diagnosis not present

## 2016-06-15 DIAGNOSIS — D122 Benign neoplasm of ascending colon: Secondary | ICD-10-CM | POA: Diagnosis not present

## 2016-06-15 DIAGNOSIS — K5900 Constipation, unspecified: Secondary | ICD-10-CM | POA: Diagnosis not present

## 2016-06-15 DIAGNOSIS — I10 Essential (primary) hypertension: Secondary | ICD-10-CM | POA: Diagnosis not present

## 2016-06-15 DIAGNOSIS — D126 Benign neoplasm of colon, unspecified: Secondary | ICD-10-CM

## 2016-06-15 DIAGNOSIS — D123 Benign neoplasm of transverse colon: Secondary | ICD-10-CM

## 2016-06-15 DIAGNOSIS — I440 Atrioventricular block, first degree: Secondary | ICD-10-CM | POA: Diagnosis not present

## 2016-06-15 DIAGNOSIS — R194 Change in bowel habit: Secondary | ICD-10-CM | POA: Diagnosis not present

## 2016-06-15 MED ORDER — SODIUM CHLORIDE 0.9 % IV SOLN: 500.0000 mL | INTRAVENOUS | Status: DC

## 2016-06-15 NOTE — Progress Notes (Signed)
Report to PACU, RN, vss, BBS= Clear.  

## 2016-06-15 NOTE — Op Note (Signed)
[place] Patient Name: Jovanny Stephanie Procedure Date: 06/15/2016 9:32 AM MRN: [id number] Endoscopist: Ladene Artist , MD Age: 82 Referring MD:  Date of Birth: [birth year] Gender: Male Account #: [id number] Procedure:                Colonoscopy Indications:              Surveillance: Personal history of adenomatous                            polyps on last colonoscopy 5 years ago Medicines:                Monitored Anesthesia Care Procedure:                Pre-Anesthesia Assessment:                           - Prior to the procedure, a History and Physical                            was performed, and patient medications and                            allergies were reviewed. The patient's tolerance of                            previous anesthesia was also reviewed. The risks                            and benefits of the procedure and the sedation                            options and risks were discussed with the patient.                            All questions were answered, and informed consent                            was obtained. Prior Anticoagulants: The patient has                            taken no previous anticoagulant or antiplatelet                            agents. ASA Grade Assessment: II - A patient with                            mild systemic disease. After reviewing the risks                            and benefits, the patient was deemed in                            satisfactory condition to undergo the procedure.  After obtaining informed consent, the colonoscope                            was passed under direct vision. Throughout the                            procedure, the patient's blood pressure, pulse, and                            oxygen saturations were monitored continuously. The                            Colonoscope was introduced through the anus and                            advanced to the the cecum,  identified by                            appendiceal orifice and ileocecal valve. The                            ileocecal valve, appendiceal orifice, and rectum                            were photographed. The quality of the bowel                            preparation was good. The colonoscopy was performed                            without difficulty. The patient tolerated the                            procedure well. Scope In: 9:39:04 AM Scope Out: 9:54:56 AM Scope Withdrawal Time: 0 hours 14 minutes 15 seconds  Total Procedure Duration: 0 hours 15 minutes 52 seconds  Findings:                 The perianal and digital rectal examinations were                            normal.                           A 3 mm polyp was found in the ascending colon. The                            polyp was sessile. The polyp was removed with a                            cold biopsy forceps. Resection and retrieval were                            complete.  Four sessile polyps were found in the sigmoid colon                            (2), transverse colon (1) and ascending colon (1).                            The polyps were 5 to 6 mm in size. These polyps                            were removed with a cold snare. Resection and                            retrieval were complete.                           Internal hemorrhoids were found during                            retroflexion. The hemorrhoids were small and Grade                            I (internal hemorrhoids that do not prolapse).                            Scarring, prominent vasculature and erythema noted                            in distal rectum.                           Multiple medium-mouthed diverticula were found in                            the left colon. There was no evidence of                            diverticular bleeding.                           The exam was otherwise without abnormality  on                            direct and retroflexion views. Complications:            No immediate complications. Estimated blood loss:                            None. Estimated Blood Loss:     Estimated blood loss: none. Impression:               - One 3 mm polyp in the ascending colon, removed                            with a cold snare. Resected and retrieved.                           -  Four 5 to 6 mm polyps in the sigmoid colon, in                            the transverse colon and in the ascending colon,                            removed with a cold snare. Resected and retrieved.                           - Internal hemorrhoids.                           - Moderate diverticulosis in the left colon.                           - The examination was otherwise normal on direct                            and retroflexion views. Recommendation:           - Patient has a contact number available for                            emergencies. The signs and symptoms of potential                            delayed complications were discussed with the                            patient. Return to normal activities tomorrow.                            Written discharge instructions were provided to the                            patient.                           - High fiber diet.                           - Continue present medications.                           - Await pathology results.                           - No repeat colonoscopy due to age. Ladene Artist, MD 06/15/2016 10:01:39 AM This report has been signed electronically.

## 2016-06-15 NOTE — Progress Notes (Signed)
No problems noted in the recovery room. maw 

## 2016-06-15 NOTE — Patient Instructions (Signed)
YOU HAD AN ENDOSCOPIC PROCEDURE TODAY AT [facility]:   Refer to the procedure report that was given to you for any specific questions about what was found during the examination.  If the procedure report does not answer your questions, please call your gastroenterologist to clarify.  If you requested that your care partner not be given the details of your procedure findings, then the procedure report has been included in a sealed envelope for you to review at your convenience later.  YOU SHOULD EXPECT: Some feelings of bloating in the abdomen. Passage of more gas than usual.  Walking can help get rid of the air that was put into your GI tract during the procedure and reduce the bloating. If you had a lower endoscopy (such as a colonoscopy or flexible sigmoidoscopy) you may notice spotting of blood in your stool or on the toilet paper. If you underwent a bowel prep for your procedure, you may not have a normal bowel movement for a few days.  Please Note:  You might notice some irritation and congestion in your nose or some drainage.  This is from the oxygen used during your procedure.  There is no need for concern and it should clear up in a day or so.  SYMPTOMS TO REPORT IMMEDIATELY:   Following lower endoscopy (colonoscopy or flexible sigmoidoscopy):  Excessive amounts of blood in the stool  Significant tenderness or worsening of abdominal pains  Swelling of the abdomen that is new, acute  Fever of 100F or higher   For urgent or emergent issues, a gastroenterologist can be reached at any hour by calling [phone].   DIET:  We do recommend a small meal at first, but then you may proceed to your regular diet.  Drink plenty of fluids but you should avoid alcoholic beverages for 24 hours.  ACTIVITY:  You should plan to take it easy for the rest of today and you should NOT DRIVE or use heavy machinery until tomorrow (because of the sedation medicines used during the test).     FOLLOW UP: Our staff will call the number listed on your records the next business day following your procedure to check on you and address any questions or concerns that you may have regarding the information given to you following your procedure. If we do not reach you, we will leave a message.  However, if you are feeling well and you are not experiencing any problems, there is no need to return our call.  We will assume that you have returned to your regular daily activities without incident.  If any biopsies were taken you will be contacted by phone or by letter within the next 1-3 weeks.  Please call us at [phone] if you have not heard about the biopsies in 3 weeks.    SIGNATURES/CONFIDENTIALITY: You and/or your care partner have signed paperwork which will be entered into your electronic medical record.  These signatures attest to the fact that that the information above on your After Visit Summary has been reviewed and is understood.  Full responsibility of the confidentiality of this discharge information lies with you and/or your care-partner.    Handouts were given to your care partner on polyps, diverticulosis, and hemorrhoids, and a high fiber diet with liberal fluid intake. You may resume your current medications today. Await biopsy results. Please call if any questions or concerns.

## 2016-06-15 NOTE — Progress Notes (Signed)
Called to room to assist during endoscopic procedure.  Patient ID and intended procedure confirmed with present staff. Received instructions for my participation in the procedure from the performing physician.  

## 2016-06-16 ENCOUNTER — Telehealth: Payer: Self-pay | Admitting: *Deleted

## 2016-06-16 NOTE — Telephone Encounter (Signed)
  Follow up Call-  Call back number 06/15/2016  Post procedure Call Back phone  # [phone]  Permission to leave phone message Yes  Some recent data might be hidden     Patient questions:  Do you have a fever, pain , or abdominal swelling? No. Pain Score  0 *  Have you tolerated food without any problems? Yes.    Have you been able to return to your normal activities? Yes.    Do you have any questions about your discharge instructions: Diet   No. Medications  No. Follow up visit  No.  Do you have questions or concerns about your Care? No.  Actions: * If pain score is 4 or above: No action needed, pain <4.

## 2016-07-01 ENCOUNTER — Encounter: Payer: Self-pay | Admitting: Gastroenterology

## 2016-07-07 DIAGNOSIS — I10 Essential (primary) hypertension: Secondary | ICD-10-CM | POA: Diagnosis not present

## 2016-07-07 DIAGNOSIS — E784 Other hyperlipidemia: Secondary | ICD-10-CM | POA: Diagnosis not present

## 2016-07-07 DIAGNOSIS — Z125 Encounter for screening for malignant neoplasm of prostate: Secondary | ICD-10-CM | POA: Diagnosis not present

## 2016-07-14 DIAGNOSIS — M1259 Traumatic arthropathy, multiple sites: Secondary | ICD-10-CM | POA: Diagnosis not present

## 2016-07-14 DIAGNOSIS — I499 Cardiac arrhythmia, unspecified: Secondary | ICD-10-CM | POA: Diagnosis not present

## 2016-07-14 DIAGNOSIS — G4709 Other insomnia: Secondary | ICD-10-CM | POA: Diagnosis not present

## 2016-07-14 DIAGNOSIS — Z8546 Personal history of malignant neoplasm of prostate: Secondary | ICD-10-CM | POA: Diagnosis not present

## 2016-07-14 DIAGNOSIS — Z0000 Encounter for general adult medical examination without abnormal findings: Secondary | ICD-10-CM | POA: Diagnosis not present

## 2016-07-14 DIAGNOSIS — K5730 Diverticulosis of large intestine without perforation or abscess without bleeding: Secondary | ICD-10-CM | POA: Diagnosis not present

## 2016-07-14 DIAGNOSIS — E784 Other hyperlipidemia: Secondary | ICD-10-CM | POA: Diagnosis not present

## 2016-07-14 DIAGNOSIS — K635 Polyp of colon: Secondary | ICD-10-CM | POA: Diagnosis not present

## 2016-07-14 DIAGNOSIS — G4733 Obstructive sleep apnea (adult) (pediatric): Secondary | ICD-10-CM | POA: Diagnosis not present

## 2016-07-14 DIAGNOSIS — I10 Essential (primary) hypertension: Secondary | ICD-10-CM | POA: Diagnosis not present

## 2016-08-23 DIAGNOSIS — R69 Illness, unspecified: Secondary | ICD-10-CM | POA: Diagnosis not present

## 2016-08-31 DIAGNOSIS — G4733 Obstructive sleep apnea (adult) (pediatric): Secondary | ICD-10-CM | POA: Diagnosis not present

## 2016-09-06 DIAGNOSIS — R69 Illness, unspecified: Secondary | ICD-10-CM | POA: Diagnosis not present

## 2016-09-06 DIAGNOSIS — G4733 Obstructive sleep apnea (adult) (pediatric): Secondary | ICD-10-CM | POA: Diagnosis not present

## 2016-11-16 DIAGNOSIS — H2513 Age-related nuclear cataract, bilateral: Secondary | ICD-10-CM | POA: Diagnosis not present

## 2016-11-16 DIAGNOSIS — H52203 Unspecified astigmatism, bilateral: Secondary | ICD-10-CM | POA: Diagnosis not present

## 2016-12-16 DIAGNOSIS — L812 Freckles: Secondary | ICD-10-CM | POA: Diagnosis not present

## 2016-12-16 DIAGNOSIS — L821 Other seborrheic keratosis: Secondary | ICD-10-CM | POA: Diagnosis not present

## 2016-12-16 DIAGNOSIS — L570 Actinic keratosis: Secondary | ICD-10-CM | POA: Diagnosis not present

## 2016-12-16 DIAGNOSIS — L820 Inflamed seborrheic keratosis: Secondary | ICD-10-CM | POA: Diagnosis not present

## 2016-12-16 DIAGNOSIS — D1801 Hemangioma of skin and subcutaneous tissue: Secondary | ICD-10-CM | POA: Diagnosis not present

## 2016-12-31 DIAGNOSIS — Z23 Encounter for immunization: Secondary | ICD-10-CM | POA: Diagnosis not present

## 2017-02-10 DIAGNOSIS — L570 Actinic keratosis: Secondary | ICD-10-CM | POA: Diagnosis not present

## 2017-02-10 DIAGNOSIS — L821 Other seborrheic keratosis: Secondary | ICD-10-CM | POA: Diagnosis not present

## 2017-03-12 ENCOUNTER — Telehealth: Payer: Self-pay

## 2017-03-12 NOTE — Telephone Encounter (Signed)
I called pt, I advised him that his appt tomorrow will need to be cancelled due to the weather. Pt is agreeable to an appt on 05/10/17 at 9:30 am but is asking for Robin to call him before then because he is having trouble with a mask leak. Pt uses [facility] but is uncomfortable reaching out to them because of the staffing changes they have had recently. Pt really wanted to meet with Robin to discuss a new mask. I advised pt that I will pass this message on to Shirlean Mylar and when our office reopens, I will ask Shirlean Mylar to call him back at her convenience. Pt verbalized understanding of the new appt date and time.

## 2017-03-13 ENCOUNTER — Ambulatory Visit: Payer: Medicare PPO | Admitting: Neurology

## 2017-03-15 NOTE — Telephone Encounter (Signed)
Left message for patient to call and schedule mask fitting

## 2017-03-29 DIAGNOSIS — G4733 Obstructive sleep apnea (adult) (pediatric): Secondary | ICD-10-CM | POA: Diagnosis not present

## 2017-04-18 DIAGNOSIS — D1801 Hemangioma of skin and subcutaneous tissue: Secondary | ICD-10-CM | POA: Diagnosis not present

## 2017-04-18 DIAGNOSIS — L820 Inflamed seborrheic keratosis: Secondary | ICD-10-CM | POA: Diagnosis not present

## 2017-04-18 DIAGNOSIS — L821 Other seborrheic keratosis: Secondary | ICD-10-CM | POA: Diagnosis not present

## 2017-04-25 DIAGNOSIS — R69 Illness, unspecified: Secondary | ICD-10-CM | POA: Diagnosis not present

## 2017-05-10 ENCOUNTER — Ambulatory Visit: Payer: Medicare HMO | Admitting: Neurology

## 2017-05-10 ENCOUNTER — Encounter: Payer: Self-pay | Admitting: Neurology

## 2017-05-10 VITALS — BP 144/95 | HR 78 | Ht 70.0 in | Wt 202.0 lb

## 2017-05-10 DIAGNOSIS — G4733 Obstructive sleep apnea (adult) (pediatric): Secondary | ICD-10-CM | POA: Diagnosis not present

## 2017-05-10 DIAGNOSIS — Z9989 Dependence on other enabling machines and devices: Secondary | ICD-10-CM

## 2017-05-10 NOTE — Progress Notes (Signed)
Subjective:    Patient ID: [name] is a 83 y.o. male.  HPI     Interim history:   [name] is a very pleasant 83 year old right-handed gentleman with an underlying medical history of hypertension, hyperlipidemia, degenerative joint disease, prostate cancer, status post radical prostatectomy 1997, history of skin cancer, allergic rhinitis, arthritis, status post left total knee arthroplasty under Dr. Maureen Ralphs in 2012, and overweight state, who presents for follow-up consultation of his obstructive sleep apnea, on CPAP therapy. The patient is unaccompanied today. I last saw him on 03/10/2016, at which time he reported doing well with CPAP. She was sleeping reasonably well with it. He was fully compliant with treatment and advised to follow-up routinely in one year.   Today, 05/10/2017: I reviewed his CPAP compliance data from 04/09/2017 through 05/08/2017 which is a total of 30 days, during which time he used his machine every night with percent used days greater than 4 hours at 100%, indicating superb compliance with an average usage of 7 hours and 31 minutes, residual AHI at goal at 2.1 per hour, leak on the high side for the 95th percentile at 30.6 L/m on a pressure of 10 cm with EPR of 1. He reports doing fairly well with CPAP but he has difficulty with the mask and tolerating the mask. He had tried using large nasal pillows including a chinstrap and a more tighter chinstrap called halo. Nevertheless, leak consistently seems to be on the high side, around 30 for the last couple of years. We fitted him with a new type of full facemask from [employer], Med F30. He is encouraged to try this at home.   The patient's allergies, current medications, family history, past medical history, past social history, past surgical history and problem list were reviewed and updated as appropriate.   Previously (copied from previous notes for reference):   I saw him on 09/09/2015, at which time he reported that he  was doing okay with CPAP. He had some discomfort with a nasal pillows and did not like the chin strap. He had some nasal congestion. He had some mouth dryness. He was compliant with CPAP therapy.  I reviewed his CPAP compliance data from 02/08/2016 through 03/08/2016 which is a total of 30 days, during which time he used his machine every night with percent used days greater than 4 hours at 100%, indicating superb compliance with an average usage of 7 hours and 42 minutes, residual AHI 2 per hour, leak on the high side with the 95th percentile at 32.3 L/m on a pressure of 10 cm with EPR of 1.    I first met him on 05/26/2015 at the request of his primary care physician, at which time he reported snoring and excessive daytime somnolence as well as a family history of obstructive sleep apnea. He had a home sleep test on 06/11/2015 which showed severe obstructive sleep apnea, overall AHI was 34, oxygen desaturation nadir was 79%, time below 88% saturation was 3 hours and 46 minutes for the night. I invited him back for a full night CPAP titration study based on the severity of his test results from the home sleep test. He had a CPAP titration study on 07/07/2015. Sleep efficiency was 72.9%, sleep latency 13 minutes, wake after sleep onset was 108 minutes with mild to moderate sleep fragmentation noted. He had an elevated arousal index, he had an increased percentage of stage II sleep, absence of slow-wave sleep and an increased percentage of REM  sleep at 26%, REM latency prolonged at 166.5 minutes. He had severe PLMS with an index of 68 per hour, arousal index from PLMS was 16.5 per hour. He had frequent PACs on EKG. Average oxygen saturation was 88% but improved to about 91% on the final CPAP pressure. CPAP was titrated from 5 cm to 10 cm. AHI was 0 per hour on the pressure of 10 cm. Based on his test results are prescribed CPAP therapy for home use.    I reviewed his CPAP compliance data from 08/09/2015 through  09/07/2015 which is a total of 30 days during which time he used his machine every night with percent used days greater than 4 hours at 100%, indicating superb compliance with an average usage of 7 hours and 47 minutes, residual AHI 3.2 per hour, leak at times high with the 95th percentile at 29.5 L/m on a pressure of 10 cm with EPR of 3.    05/26/2015: He reports snoring and excessive daytime somnolence. He denies a FHx of OSA. His Epworth sleepiness score is 2 out of 24 today, his fatigue score is 2 out of 63 today. He reports having had a sleep study several years ago may be 10+ years ago and per his report it was borderline at the time. He has irregular breathing and brief pauses, per wife's report, and snorting sounds per son's feedback.   He denies morning headaches, but has nocturia 2-3 times per night. He has started having some L knee discomfort but denies RLS symptoms.   He tries to exercise at the [facility] and plays Golf about 3 days a week.   He drinks caffeine in the form of coffee, about 2 cups a day. He drinks about 2 mixed drinks per day, he is a nonsmoker. He retired from [employer] after 40 years. He has 3 children, 7 grandchildren. He lives with his wife. He does not have trouble falling asleep or staying asleep. I reviewed your office note from 10/22/2014, which you kindly included.  His Past Medical History Is Significant For: Past Medical History:  Diagnosis Date  . Allergy   . Arthritis   . Cancer [facility])    prostate CA 1997, skin CA  . Cataract   . Colon polyp   . GERD (gastroesophageal reflux disease)   . History of colon polyps    adenoma;[facility]  . History of prostate cancer   . History of skin cancer   . HTN (hypertension)   . Other and unspecified hyperlipidemia   . PAC (premature atrial contraction)   . Sleep apnea    uses CPAP    His Past Surgical History Is Significant For: Past Surgical History:  Procedure Laterality Date  . COLONOSCOPY     . COLONOSCOPY W/ POLYPECTOMY  last 2013    X 2; [facility]  . HEMORROIDECTOMY    . PROSTATE SURGERY    . PROSTATECTOMY  1997   Dr Rosana Hoes  . ROTATOR CUFF REPAIR      X 3(L twice); Dr Theda Sers  . TOTAL KNEE ARTHROPLASTY     L; Dr Maureen Ralphs    His Family History Is Significant For: Family History  Problem Relation Age of Onset  . Stroke Mother 70  . Heart disease Father 63       ?MI  . Heart disease Brother        CBAG in 93s  . Dementia Brother        vascular dementia  .  Colon cancer Neg Hx   . Stomach cancer Neg Hx   . Diabetes Neg Hx   . Cancer Neg Hx   . Esophageal cancer Neg Hx   . Rectal cancer Neg Hx     His Social History Is Significant For: Social History   Socioeconomic History  . Marital status: Married    Spouse name: None  . Number of children: 3  . Years of education: BA  . Highest education level: None  Social Needs  . Financial resource strain: None  . Food insecurity - worry: None  . Food insecurity - inability: None  . Transportation needs - medical: None  . Transportation needs - non-medical: None  Occupational History  . Occupation: Retired  Tobacco Use  . Smoking status: Never Smoker  . Smokeless tobacco: Never Used  Substance and Sexual Activity  . Alcohol use: Yes    Alcohol/week: 8.4 oz    Types: 14 Shots of liquor per week    Comment:  14 drinks /week  . Drug use: No  . Sexual activity: None  Other Topics Concern  . None  Social History Narrative   Drinks 2 cups of coffee a day     His Allergies Are:  No Known Allergies:   His Current Medications Are:  Outpatient Encounter Medications as of 05/10/2017  Medication Sig  . amLODipine (NORVASC) 10 MG tablet Take 1 tablet (10 mg total) by mouth daily.  Marland Kitchen aspirin 81 MG tablet Take 160 mg by mouth daily.  Marland Kitchen atorvastatin (LIPITOR) 20 MG tablet TAKE ONE AND ONE-HALF TABLETS DAILY  . benazepril (LOTENSIN) 40 MG tablet Take 1 tablet (40 mg total) by mouth daily.  Marland Kitchen  glucosamine-chondroitin 500-400 MG tablet Take 1 tablet by mouth daily.  . Loratadine (CLARITIN) 10 MG CAPS Take by mouth.  . metoprolol (LOPRESSOR) 50 MG tablet 1/2 by mouth two times daily  . Multiple Vitamin (MULTIVITAMIN) tablet Take 1 tablet by mouth daily.  . temazepam (RESTORIL) 15 MG capsule Take 15 mg by mouth at bedtime as needed for sleep.  . [DISCONTINUED] celecoxib (CELEBREX) 200 MG capsule Take 1 capsule (200 mg total) by mouth 2 (two) times daily.  . [DISCONTINUED] NEXIUM 24HR 20 MG capsule TAKE (1) CAPSULE DAILY. (Patient not taking: Reported on 06/15/2016)   Facility-Administered Encounter Medications as of 05/10/2017  Medication  . 0.9 %  sodium chloride infusion  :  Review of Systems:  Out of a complete 14 point review of systems, all are reviewed and negative with the exception of these symptoms as listed below: Review of Systems  Neurological:       Pt presents today to discuss his cpap. Pt is having trouble finding a good mask fit.    Objective:  Neurological Exam  Physical Exam Physical Examination:   Vitals:   05/10/17 0925  BP: (!) 144/95  Pulse: 78   General Examination: The patient is a very pleasant 83 y.o. male in no acute distress. He appears well-developed and well-nourished and well groomed.   HEENT: Normocephalic, atraumatic, pupils are equal, round and reactive to light and accommodation. He wears corrective eyeglasses. Hearing is grossly intact. Face is symmetric, normal facial animation, normal facial sensation. Speech is clear, no hypophonia, no tremor, neck is supple. Airway examination reveals no change.  Chest: Clear to auscultation without wheezing, rhonchi or crackles noted.  Heart: S1+S2+0, regular and normal without murmurs, rubs or gallops noted.   Abdomen: Soft, non-tender and non-distended with normal bowel sounds  appreciated on auscultation.  Extremities: There is no pitting edema in the distal lower extremities bilaterally.  Pedal pulses are intact.  Skin: Warm and dry without trophic changes noted. There are mild varicose veins.  Musculoskeletal: exam reveals no obvious joint deformities, tenderness or joint swelling or erythema.   Neurologically:  Mental status: The patient is awake, alert and oriented in all 4 spheres. His immediate and remote memory, attention, language skills and fund of knowledge are appropriate. There is no evidence of aphasia, agnosia, apraxia or anomia. Speech is clear with normal prosody and enunciation. Thought process is linear. Mood is normal and affect is normal.  Cranial nerves II - XII are as described above under HEENT exam.  Motor exam: Normal bulk, strength and tone is noted. There is no drift, tremor or rebound. Reflexes are 1+ throughout. Fine motor skills and coordination: Grossly intact in the upper and lower extremities. Cerebellar testing: No dysmetria or intention tremor on finger to nose testing.  Sensory exam: intact to light touch in the upper and lower extremities.  Gait, station and balance: He stands easily. No veering to one side is noted. No leaning to one side is noted. Posture is age-appropriate and stance is narrow based. Gait shows normal stride length and normal pace. No problems turning are noted.   Assessment and Plan:  In summary, GERBER PENZA is a very pleasant 83 year old male with an underlying medical history of hypertension, hyperlipidemia, degenerative joint disease, prostate cancer, status post radical prostatectomy 1997, history of skin cancer, allergic rhinitis, arthritis, status post left total knee arthroplasty under Dr. Maureen Ralphs in 2012, and overweight state, who presents for follow-up consultation of his obstructive sleep apnea which was determined to be severe by a home sleep test in March 2017. His overall AHI was 34.9 per hour. He was advised to return for a full night CPAP titration study, which he had on 07/07/2015. We talked about his test  results  previously in detail. He has since then been on CPAP therapy and has done rather well. He continues to be fully compliant. He still has some issues with mouth opening and air leakage from the mouth. He has tried different types of chinstrap for this. He could not really tolerate a full facemask but is willing to try this new type of full face mask that we provided him today. He also met separately with our sleep lab manager for this. He is commended for his treatment adherence. He continues to feel well with CPAP therapy. I suggested a one-year checkup, sooner as needed. He takes temazepam occasionally, has tried an over-the-counter sleep aid called Doxylamine, and is encouraged to try melatonin if possible. He may not benefit from daily doxylamine. He is cautioned about the use of temazepam and he uses it sparingly. I answered all his questions today and he was in agreement with the plan.  I spent 30 minutes in total face-to-face time with the patient, more than 50% of which was spent in counseling and coordination of care, reviewing test results, reviewing medication and discussing or reviewing the diagnosis of OSA, its prognosis and treatment options. Pertinent laboratory and imaging test results that were available during this visit with the patient were reviewed by me and considered in my medical decision making (see chart for details).

## 2017-05-10 NOTE — Patient Instructions (Addendum)
Please continue using your CPAP regularly. While your insurance requires that you use CPAP at least 4 hours each night on 70% of the nights, I recommend, that you not skip any nights and use it throughout the night if you can. Getting used to CPAP and staying with the treatment long term does take time and patience and discipline. Untreated obstructive sleep apnea when it is moderate to severe can have an adverse impact on cardiovascular health and raise her risk for heart disease, arrhythmias, hypertension, congestive heart failure, stroke and diabetes. Untreated obstructive sleep apnea causes sleep disruption, nonrestorative sleep, and sleep deprivation. This can have an impact on your day to day functioning and cause daytime sleepiness and impairment of cognitive function, memory loss, mood disturbance, and problems focussing. Using CPAP regularly can improve these symptoms.  Try not to use Temazepam daily.   Doxylamine is more benign, but does not work well on a daily basis.   You can try Melatonin to alternate with the Doxylamine at night for sleep: take 1 mg to 3 mg, one to 2 hours before your bedtime. You can go up to 5 mg if needed. It is over the counter and comes in pill form, chewable form and spray, if you prefer.    We will let you try a ResMed F30 full face mask at home. If you like it, I can prescribe it.   Follow up in one year.

## 2017-05-19 DIAGNOSIS — M25562 Pain in left knee: Secondary | ICD-10-CM | POA: Diagnosis not present

## 2017-05-19 DIAGNOSIS — Z96652 Presence of left artificial knee joint: Secondary | ICD-10-CM | POA: Diagnosis not present

## 2017-07-11 DIAGNOSIS — R82998 Other abnormal findings in urine: Secondary | ICD-10-CM | POA: Diagnosis not present

## 2017-07-11 DIAGNOSIS — Z125 Encounter for screening for malignant neoplasm of prostate: Secondary | ICD-10-CM | POA: Diagnosis not present

## 2017-07-11 DIAGNOSIS — I10 Essential (primary) hypertension: Secondary | ICD-10-CM | POA: Diagnosis not present

## 2017-07-11 DIAGNOSIS — E785 Hyperlipidemia, unspecified: Secondary | ICD-10-CM | POA: Diagnosis not present

## 2017-07-18 DIAGNOSIS — Z8546 Personal history of malignant neoplasm of prostate: Secondary | ICD-10-CM | POA: Diagnosis not present

## 2017-07-18 DIAGNOSIS — K635 Polyp of colon: Secondary | ICD-10-CM | POA: Diagnosis not present

## 2017-07-18 DIAGNOSIS — K5730 Diverticulosis of large intestine without perforation or abscess without bleeding: Secondary | ICD-10-CM | POA: Diagnosis not present

## 2017-07-18 DIAGNOSIS — Z0000 Encounter for general adult medical examination without abnormal findings: Secondary | ICD-10-CM | POA: Diagnosis not present

## 2017-07-18 DIAGNOSIS — M129 Arthropathy, unspecified: Secondary | ICD-10-CM | POA: Diagnosis not present

## 2017-07-18 DIAGNOSIS — I499 Cardiac arrhythmia, unspecified: Secondary | ICD-10-CM | POA: Diagnosis not present

## 2017-07-18 DIAGNOSIS — G4709 Other insomnia: Secondary | ICD-10-CM | POA: Diagnosis not present

## 2017-07-18 DIAGNOSIS — E7849 Other hyperlipidemia: Secondary | ICD-10-CM | POA: Diagnosis not present

## 2017-07-18 DIAGNOSIS — G4733 Obstructive sleep apnea (adult) (pediatric): Secondary | ICD-10-CM | POA: Diagnosis not present

## 2017-07-18 DIAGNOSIS — I10 Essential (primary) hypertension: Secondary | ICD-10-CM | POA: Diagnosis not present

## 2017-07-25 DIAGNOSIS — Z1212 Encounter for screening for malignant neoplasm of rectum: Secondary | ICD-10-CM | POA: Diagnosis not present

## 2017-08-10 DIAGNOSIS — J309 Allergic rhinitis, unspecified: Secondary | ICD-10-CM | POA: Diagnosis not present

## 2017-08-10 DIAGNOSIS — Z6828 Body mass index (BMI) 28.0-28.9, adult: Secondary | ICD-10-CM | POA: Diagnosis not present

## 2017-08-10 DIAGNOSIS — J329 Chronic sinusitis, unspecified: Secondary | ICD-10-CM | POA: Diagnosis not present

## 2017-09-28 DIAGNOSIS — G4733 Obstructive sleep apnea (adult) (pediatric): Secondary | ICD-10-CM | POA: Diagnosis not present

## 2017-10-20 DIAGNOSIS — S70362A Insect bite (nonvenomous), left thigh, initial encounter: Secondary | ICD-10-CM | POA: Diagnosis not present

## 2017-10-20 DIAGNOSIS — S70361A Insect bite (nonvenomous), right thigh, initial encounter: Secondary | ICD-10-CM | POA: Diagnosis not present

## 2017-11-13 DIAGNOSIS — J309 Allergic rhinitis, unspecified: Secondary | ICD-10-CM | POA: Diagnosis not present

## 2017-11-13 DIAGNOSIS — I10 Essential (primary) hypertension: Secondary | ICD-10-CM | POA: Diagnosis not present

## 2017-11-13 DIAGNOSIS — J069 Acute upper respiratory infection, unspecified: Secondary | ICD-10-CM | POA: Diagnosis not present

## 2017-11-13 DIAGNOSIS — Z6828 Body mass index (BMI) 28.0-28.9, adult: Secondary | ICD-10-CM | POA: Diagnosis not present

## 2017-11-14 DIAGNOSIS — R69 Illness, unspecified: Secondary | ICD-10-CM | POA: Diagnosis not present

## 2017-11-28 DIAGNOSIS — H2513 Age-related nuclear cataract, bilateral: Secondary | ICD-10-CM | POA: Diagnosis not present

## 2017-11-28 DIAGNOSIS — H52203 Unspecified astigmatism, bilateral: Secondary | ICD-10-CM | POA: Diagnosis not present

## 2017-11-28 DIAGNOSIS — Z0100 Encounter for examination of eyes and vision without abnormal findings: Secondary | ICD-10-CM | POA: Diagnosis not present

## 2017-12-09 DIAGNOSIS — Z23 Encounter for immunization: Secondary | ICD-10-CM | POA: Diagnosis not present

## 2017-12-20 DIAGNOSIS — K429 Umbilical hernia without obstruction or gangrene: Secondary | ICD-10-CM | POA: Diagnosis not present

## 2017-12-22 DIAGNOSIS — L812 Freckles: Secondary | ICD-10-CM | POA: Diagnosis not present

## 2017-12-22 DIAGNOSIS — D1801 Hemangioma of skin and subcutaneous tissue: Secondary | ICD-10-CM | POA: Diagnosis not present

## 2017-12-22 DIAGNOSIS — L570 Actinic keratosis: Secondary | ICD-10-CM | POA: Diagnosis not present

## 2017-12-22 DIAGNOSIS — L821 Other seborrheic keratosis: Secondary | ICD-10-CM | POA: Diagnosis not present

## 2017-12-22 DIAGNOSIS — S70362A Insect bite (nonvenomous), left thigh, initial encounter: Secondary | ICD-10-CM | POA: Diagnosis not present

## 2017-12-22 DIAGNOSIS — L858 Other specified epidermal thickening: Secondary | ICD-10-CM | POA: Diagnosis not present

## 2018-01-09 DIAGNOSIS — R32 Unspecified urinary incontinence: Secondary | ICD-10-CM | POA: Diagnosis not present

## 2018-01-09 DIAGNOSIS — Z6828 Body mass index (BMI) 28.0-28.9, adult: Secondary | ICD-10-CM | POA: Diagnosis not present

## 2018-01-09 DIAGNOSIS — B3742 Candidal balanitis: Secondary | ICD-10-CM | POA: Diagnosis not present

## 2018-02-01 DIAGNOSIS — G4733 Obstructive sleep apnea (adult) (pediatric): Secondary | ICD-10-CM | POA: Diagnosis not present

## 2018-03-19 DIAGNOSIS — N393 Stress incontinence (female) (male): Secondary | ICD-10-CM | POA: Diagnosis not present

## 2018-03-19 DIAGNOSIS — N529 Male erectile dysfunction, unspecified: Secondary | ICD-10-CM | POA: Diagnosis not present

## 2018-03-19 DIAGNOSIS — Z8546 Personal history of malignant neoplasm of prostate: Secondary | ICD-10-CM | POA: Diagnosis not present

## 2018-04-05 ENCOUNTER — Ambulatory Visit: Payer: Self-pay | Admitting: Surgery

## 2018-04-13 DIAGNOSIS — K429 Umbilical hernia without obstruction or gangrene: Secondary | ICD-10-CM | POA: Diagnosis not present

## 2018-04-20 ENCOUNTER — Inpatient Hospital Stay (HOSPITAL_COMMUNITY): Admission: RE | Admit: 2018-04-20 | Payer: Medicare HMO | Source: Ambulatory Visit

## 2018-04-27 ENCOUNTER — Ambulatory Visit: Admit: 2018-04-27 | Payer: Medicare HMO | Admitting: Surgery

## 2018-04-27 SURGERY — REPAIR, HERNIA, UMBILICAL, LAPAROSCOPIC
Anesthesia: General

## 2018-05-14 ENCOUNTER — Encounter: Payer: Self-pay | Admitting: Neurology

## 2018-05-14 DIAGNOSIS — G4733 Obstructive sleep apnea (adult) (pediatric): Secondary | ICD-10-CM | POA: Diagnosis not present

## 2018-05-16 ENCOUNTER — Ambulatory Visit: Payer: Medicare HMO | Admitting: Neurology

## 2018-05-16 ENCOUNTER — Encounter: Payer: Self-pay | Admitting: Neurology

## 2018-05-16 VITALS — BP 126/79 | HR 71 | Ht 70.0 in | Wt 198.0 lb

## 2018-05-16 DIAGNOSIS — Z9989 Dependence on other enabling machines and devices: Secondary | ICD-10-CM

## 2018-05-16 DIAGNOSIS — I10 Essential (primary) hypertension: Secondary | ICD-10-CM | POA: Diagnosis not present

## 2018-05-16 DIAGNOSIS — G4733 Obstructive sleep apnea (adult) (pediatric): Secondary | ICD-10-CM

## 2018-05-16 DIAGNOSIS — Z6828 Body mass index (BMI) 28.0-28.9, adult: Secondary | ICD-10-CM | POA: Diagnosis not present

## 2018-05-16 DIAGNOSIS — K644 Residual hemorrhoidal skin tags: Secondary | ICD-10-CM | POA: Diagnosis not present

## 2018-05-16 DIAGNOSIS — K635 Polyp of colon: Secondary | ICD-10-CM | POA: Diagnosis not present

## 2018-05-16 DIAGNOSIS — J3089 Other allergic rhinitis: Secondary | ICD-10-CM | POA: Diagnosis not present

## 2018-05-16 NOTE — Progress Notes (Signed)
Subjective:    Patient ID: [name] is a 84 y.o. male.  HPI     Interim history:  [name] is an 84 year old right-handed gentleman with an underlying medical history of hypertension, hyperlipidemia, degenerative joint disease, prostate cancer, status post radical prostatectomy 1997, history of skin cancer, allergic rhinitis, arthritis, status post left total knee arthroplasty under Dr. Maureen Ralphs in 2012, and overweight state, who presents for follow-up consultation of his obstructive sleep apnea, on CPAP therapy. The patient is unaccompanied today and presents for his yearly checkup. I last saw him on 05/10/2017, at which time he was fully compliant with his CPAP and doing well.  Today, 05/16/2018: I reviewed his CPAP compliance data from 04/15/2018 through 05/14/2018 which is a total of 30 days, during which time he used his CPAP every night with percent used days greater than 4 hours at 100%, indicating superb compliance with an average usage of 8 hours and 13 minutes which is great as well. AHI at goal at 1.4 per hour, leak on the high side with the 95th percentile at 33.5 L/m on a pressure of 10 cm with EPR of 1. He reports that CPAP is going well. Still some trouble with sleep maintenance. Takes the melatonin every night, had a old Rx for Temazepam 15 mg from Dr. Dagmar Hait. Plays golf, stays active.    The patient's allergies, current medications, family history, past medical history, past social history, past surgical history and problem list were reviewed and updated as appropriate.    Previously (copied from previous notes for reference):   I saw him on 03/10/2016, at which time he reported doing well with CPAP. She was sleeping reasonably well with it. He was fully compliant with treatment and advised to follow-up routinely in one year.    I reviewed his CPAP compliance data from 04/09/2017 through 05/08/2017 which is a total of 30 days, during which time he used his machine every night with  percent used days greater than 4 hours at 100%, indicating superb compliance with an average usage of 7 hours and 31 minutes, residual AHI at goal at 2.1 per hour, leak on the high side for the 95th percentile at 30.6 L/m on a pressure of 10 cm with EPR of 1.    I saw him on 09/09/2015, at which time he reported that he was doing okay with CPAP. He had some discomfort with a nasal pillows and did not like the chin strap. He had some nasal congestion. He had some mouth dryness. He was compliant with CPAP therapy.   I reviewed his CPAP compliance data from 02/08/2016 through 03/08/2016 which is a total of 30 days, during which time he used his machine every night with percent used days greater than 4 hours at 100%, indicating superb compliance with an average usage of 7 hours and 42 minutes, residual AHI 2 per hour, leak on the high side with the 95th percentile at 32.3 L/m on a pressure of 10 cm with EPR of 1.   I first met him on 05/26/2015 at the request of his primary care physician, at which time he reported snoring and excessive daytime somnolence as well as a family history of obstructive sleep apnea. He had a home sleep test on 06/11/2015 which showed severe obstructive sleep apnea, overall AHI was 34, oxygen desaturation nadir was 79%, time below 88% saturation was 3 hours and 46 minutes for the night. I invited him back for a full night CPAP titration  study based on the severity of his test results from the home sleep test. He had a CPAP titration study on 07/07/2015. Sleep efficiency was 72.9%, sleep latency 13 minutes, wake after sleep onset was 108 minutes with mild to moderate sleep fragmentation noted. He had an elevated arousal index, he had an increased percentage of stage II sleep, absence of slow-wave sleep and an increased percentage of REM sleep at 26%, REM latency prolonged at 166.5 minutes. He had severe PLMS with an index of 68 per hour, arousal index from PLMS was 16.5 per hour. He had  frequent PACs on EKG. Average oxygen saturation was 88% but improved to about 91% on the final CPAP pressure. CPAP was titrated from 5 cm to 10 cm. AHI was 0 per hour on the pressure of 10 cm. Based on his test results are prescribed CPAP therapy for home use.    I reviewed his CPAP compliance data from 08/09/2015 through 09/07/2015 which is a total of 30 days during which time he used his machine every night with percent used days greater than 4 hours at 100%, indicating superb compliance with an average usage of 7 hours and 47 minutes, residual AHI 3.2 per hour, leak at times high with the 95th percentile at 29.5 L/m on a pressure of 10 cm with EPR of 3.    05/26/2015: He reports snoring and excessive daytime somnolence. He denies a FHx of OSA. His Epworth sleepiness score is 2 out of 24 today, his fatigue score is 2 out of 63 today. He reports having had a sleep study several years ago may be 10+ years ago and per his report it was borderline at the time. He has irregular breathing and brief pauses, per wife's report, and snorting sounds per son's feedback.   He denies morning headaches, but has nocturia 2-3 times per night. He has started having some L knee discomfort but denies RLS symptoms.   He tries to exercise at the [facility] and plays Golf about 3 days a week.   He drinks caffeine in the form of coffee, about 2 cups a day. He drinks about 2 mixed drinks per day, he is a nonsmoker. He retired from [employer] after 40 years. He has 3 children, 7 grandchildren. He lives with his wife. He does not have trouble falling asleep or staying asleep. I reviewed your office note from 10/22/2014, which you kindly included.  His Past Medical History Is Significant For: Past Medical History:  Diagnosis Date  . Allergy   . Arthritis   . Cancer [facility])    prostate CA 1997, skin CA  . Cataract   . Colon polyp   . GERD (gastroesophageal reflux disease)   . History of colon polyps     adenoma;[facility]  . History of prostate cancer   . History of skin cancer   . HTN (hypertension)   . Other and unspecified hyperlipidemia   . PAC (premature atrial contraction)   . Sleep apnea    uses CPAP    His Past Surgical History Is Significant For: Past Surgical History:  Procedure Laterality Date  . COLONOSCOPY    . COLONOSCOPY W/ POLYPECTOMY  last 2013    X 2; [facility]  . HEMORROIDECTOMY    . PROSTATE SURGERY    . PROSTATECTOMY  1997   Dr Rosana Hoes  . ROTATOR CUFF REPAIR      X 3(L twice); Dr Theda Sers  . TOTAL KNEE ARTHROPLASTY  L; Dr Maureen Ralphs    His Family History Is Significant For: Family History  Problem Relation Age of Onset  . Stroke Mother 12  . Heart disease Father 74       ?MI  . Heart disease Brother        CBAG in 26s  . Dementia Brother        vascular dementia  . Colon cancer Neg Hx   . Stomach cancer Neg Hx   . Diabetes Neg Hx   . Cancer Neg Hx   . Esophageal cancer Neg Hx   . Rectal cancer Neg Hx     His Social History Is Significant For: Social History   Socioeconomic History  . Marital status: Married    Spouse name: Not on file  . Number of children: 3  . Years of education: BA  . Highest education level: Not on file  Occupational History  . Occupation: Retired  Scientific laboratory technician  . Financial resource strain: Not on file  . Food insecurity:    Worry: Not on file    Inability: Not on file  . Transportation needs:    Medical: Not on file    Non-medical: Not on file  Tobacco Use  . Smoking status: Never Smoker  . Smokeless tobacco: Never Used  Substance and Sexual Activity  . Alcohol use: Yes    Alcohol/week: 14.0 standard drinks    Types: 14 Shots of liquor per week    Comment:  14 drinks /week  . Drug use: No  . Sexual activity: Not on file  Lifestyle  . Physical activity:    Days per week: Not on file    Minutes per session: Not on file  . Stress: Not on file  Relationships  . Social connections:    Talks on phone:  Not on file    Gets together: Not on file    Attends religious service: Not on file    Active member of club or organization: Not on file    Attends meetings of clubs or organizations: Not on file    Relationship status: Not on file  Other Topics Concern  . Not on file  Social History Narrative   Drinks 2 cups of coffee a day     His Allergies Are:  No Known Allergies:   His Current Medications Are:  Outpatient Encounter Medications as of 05/16/2018  Medication Sig  . amLODipine (NORVASC) 10 MG tablet Take 1 tablet (10 mg total) by mouth daily. (Patient taking differently: Take 10 mg by mouth at bedtime. )  . atorvastatin (LIPITOR) 20 MG tablet TAKE ONE AND ONE-HALF TABLETS DAILY (Patient taking differently: Take 30 mg by mouth every morning. TAKE ONE AND ONE-HALF TABLETS DAILY)  . benazepril (LOTENSIN) 40 MG tablet Take 1 tablet (40 mg total) by mouth daily. (Patient taking differently: Take 40 mg by mouth at bedtime. )  . fluticasone (FLONASE) 50 MCG/ACT nasal spray Place 1 spray into both nostrils daily as needed for allergies or rhinitis.  . Glucosamine 750 MG TABS Take 750 mg by mouth daily.  . Loratadine (CLARITIN) 10 MG CAPS Take 10 mg by mouth every morning.   . Melatonin 10 MG TABS Take 10 mg by mouth at bedtime as needed (sleep).  . metoprolol (LOPRESSOR) 50 MG tablet 1/2 by mouth two times daily (Patient taking differently: Take 25 mg by mouth 2 (two) times daily. )  . Multiple Vitamin (MULTIVITAMIN) tablet Take 1 tablet by mouth daily.  Facility-Administered Encounter Medications as of 05/16/2018  Medication  . 0.9 %  sodium chloride infusion  :  Review of Systems:  Out of a complete 14 point review of systems, all are reviewed and negative with the exception of these symptoms as listed below: Review of Systems  Neurological:       Pt presents today to discuss his cpap. Pt reports that his cpap is going well.    Objective:  Neurological Exam  Physical  Exam Physical Examination:   Vitals:   05/16/18 0931  BP: 126/79  Pulse: 71   General Examination: The patient is a very pleasant 84 y.o. male in no acute distress. He appears well-developed and well-nourished and well groomed.   HEENT:Normocephalic, atraumatic, pupils are equal, round and reactive to light and accommodation. He wears corrective eyeglasses. Hearing is grossly intact. Face is symmetric, normal facial animation, normal facial sensation. Speech is clear, no hypophonia, no tremor, neck is supple. Airway examination reveals no change, but mild pharyngeal erythema.   Chest:Clear to auscultation without wheezing, rhonchi or crackles noted.  Heart:S1+S2+0, regular and normal without murmurs, rubs or gallops noted.   Abdomen:Soft, non-tender and non-distended.  Extremities:There is no pitting edema in the distal lower extremities bilaterally.  Skin: Warm and dry without trophic changes noted. There are mild varicose veins.  Musculoskeletal: exam reveals no obvious joint deformities, tenderness or joint swelling or erythema.   Neurologically:  Mental status: The patient is awake, alert and oriented in all 4 spheres. His immediate and remote memory, attention, language skills and fund of knowledge are appropriate. There is no evidence of aphasia, agnosia, apraxia or anomia. Speech is clear with normal prosody and enunciation. Thought process is linear. Mood is normal and affect is normal.  Cranial nerves II - XII are as described above under HEENT exam.  Motor exam: Normal bulk, strength and tone is noted. There is no drift, tremor or rebound. Fine motor skills and coordination: Grossly intact in the upper and lower extremities. Cerebellar testing: No dysmetria or intention tremor on finger to nose testing.  Sensory exam: intact to light touch in the upper and lower extremities.  Gait, station and balance: He stands easily. No veering to one side is noted. No leaning  to one side is noted. Posture is age-appropriate and stance is narrow based. Gait shows normal stride length and normal pace. No problems turning are noted.   Assessment and Plan:  In summary, KHANG HANNUM is a very pleasant 84 year old male with an underlying medical history of hypertension, hyperlipidemia, degenerative joint disease, prostate cancer, status post radical prostatectomy 1997, history of skin cancer, allergic rhinitis, arthritis, status post left total knee arthroplasty under Dr. Maureen Ralphs in 2012, and overweight state, whopresents for follow-up consultation of his obstructive sleep apnea which was determined to be severe by ahome sleep test in March 2017.His overall AHI was 34.9 per hour. He was advised to return for a full night CPAP titration study, which he had on04/07/2015. We talked about his test results previously in detail. He has since then been on CPAP therapy and has done rather well. He continues to be fully compliant. He still has some issues with mouth opening and air leakage from the mouth. He has tried different types of chinstrap for this. He could not really tolerate a full facemask and went back to using a nasal pillows interface. He is highly commended, for his treatment adherence. He takes melatonin, has taken temazepam occasionally, has tried an  over-the-counter sleep aid as well. He is again cautioned about the use of temazepam, which he has used it very sparingly. He is advised to FU in one year. I answered all his questions today and he was in agreement.  I spent 25 minutes in total face-to-face time with the patient, more than 50% of which was spent in counseling and coordination of care, reviewing test results, reviewing medication and discussing or reviewing the diagnosis of OSA, its prognosis and treatment options. Pertinent laboratory and imaging test results that were available during this visit with the patient were reviewed by me and considered in my medical  decision making (see chart for details).

## 2018-05-16 NOTE — Patient Instructions (Signed)

## 2018-05-22 DIAGNOSIS — R69 Illness, unspecified: Secondary | ICD-10-CM | POA: Diagnosis not present

## 2018-07-12 DIAGNOSIS — S99922A Unspecified injury of left foot, initial encounter: Secondary | ICD-10-CM | POA: Diagnosis not present

## 2018-07-12 DIAGNOSIS — Z23 Encounter for immunization: Secondary | ICD-10-CM | POA: Diagnosis not present

## 2018-07-12 DIAGNOSIS — B3742 Candidal balanitis: Secondary | ICD-10-CM | POA: Diagnosis not present

## 2018-07-12 DIAGNOSIS — L0889 Other specified local infections of the skin and subcutaneous tissue: Secondary | ICD-10-CM | POA: Diagnosis not present

## 2018-07-12 DIAGNOSIS — Z5189 Encounter for other specified aftercare: Secondary | ICD-10-CM | POA: Diagnosis not present

## 2018-07-12 DIAGNOSIS — J309 Allergic rhinitis, unspecified: Secondary | ICD-10-CM | POA: Diagnosis not present

## 2018-07-17 DIAGNOSIS — J309 Allergic rhinitis, unspecified: Secondary | ICD-10-CM | POA: Diagnosis not present

## 2018-07-17 DIAGNOSIS — B3742 Candidal balanitis: Secondary | ICD-10-CM | POA: Diagnosis not present

## 2018-07-17 DIAGNOSIS — L0889 Other specified local infections of the skin and subcutaneous tissue: Secondary | ICD-10-CM | POA: Diagnosis not present

## 2018-07-17 DIAGNOSIS — Z5189 Encounter for other specified aftercare: Secondary | ICD-10-CM | POA: Diagnosis not present

## 2018-07-24 DIAGNOSIS — I10 Essential (primary) hypertension: Secondary | ICD-10-CM | POA: Diagnosis not present

## 2018-07-24 DIAGNOSIS — Z125 Encounter for screening for malignant neoplasm of prostate: Secondary | ICD-10-CM | POA: Diagnosis not present

## 2018-07-24 DIAGNOSIS — E7849 Other hyperlipidemia: Secondary | ICD-10-CM | POA: Diagnosis not present

## 2018-07-24 DIAGNOSIS — R82998 Other abnormal findings in urine: Secondary | ICD-10-CM | POA: Diagnosis not present

## 2018-07-31 DIAGNOSIS — K635 Polyp of colon: Secondary | ICD-10-CM | POA: Diagnosis not present

## 2018-07-31 DIAGNOSIS — G4733 Obstructive sleep apnea (adult) (pediatric): Secondary | ICD-10-CM | POA: Diagnosis not present

## 2018-07-31 DIAGNOSIS — I499 Cardiac arrhythmia, unspecified: Secondary | ICD-10-CM | POA: Diagnosis not present

## 2018-07-31 DIAGNOSIS — Z8546 Personal history of malignant neoplasm of prostate: Secondary | ICD-10-CM | POA: Diagnosis not present

## 2018-07-31 DIAGNOSIS — M129 Arthropathy, unspecified: Secondary | ICD-10-CM | POA: Diagnosis not present

## 2018-07-31 DIAGNOSIS — G4700 Insomnia, unspecified: Secondary | ICD-10-CM | POA: Diagnosis not present

## 2018-07-31 DIAGNOSIS — I10 Essential (primary) hypertension: Secondary | ICD-10-CM | POA: Diagnosis not present

## 2018-07-31 DIAGNOSIS — K5730 Diverticulosis of large intestine without perforation or abscess without bleeding: Secondary | ICD-10-CM | POA: Diagnosis not present

## 2018-07-31 DIAGNOSIS — Z0000 Encounter for general adult medical examination without abnormal findings: Secondary | ICD-10-CM | POA: Diagnosis not present

## 2018-07-31 DIAGNOSIS — E785 Hyperlipidemia, unspecified: Secondary | ICD-10-CM | POA: Diagnosis not present

## 2018-08-09 DIAGNOSIS — M25561 Pain in right knee: Secondary | ICD-10-CM | POA: Diagnosis not present

## 2018-08-09 DIAGNOSIS — Z96652 Presence of left artificial knee joint: Secondary | ICD-10-CM | POA: Diagnosis not present

## 2018-09-05 DIAGNOSIS — G4733 Obstructive sleep apnea (adult) (pediatric): Secondary | ICD-10-CM | POA: Diagnosis not present

## 2018-10-15 DIAGNOSIS — D485 Neoplasm of uncertain behavior of skin: Secondary | ICD-10-CM | POA: Diagnosis not present

## 2018-10-15 DIAGNOSIS — D0462 Carcinoma in situ of skin of left upper limb, including shoulder: Secondary | ICD-10-CM | POA: Diagnosis not present

## 2018-10-15 DIAGNOSIS — L821 Other seborrheic keratosis: Secondary | ICD-10-CM | POA: Diagnosis not present

## 2018-10-15 DIAGNOSIS — L820 Inflamed seborrheic keratosis: Secondary | ICD-10-CM | POA: Diagnosis not present

## 2018-10-29 DIAGNOSIS — J309 Allergic rhinitis, unspecified: Secondary | ICD-10-CM | POA: Diagnosis not present

## 2018-10-29 DIAGNOSIS — J029 Acute pharyngitis, unspecified: Secondary | ICD-10-CM | POA: Diagnosis not present

## 2018-10-30 DIAGNOSIS — Z20828 Contact with and (suspected) exposure to other viral communicable diseases: Secondary | ICD-10-CM | POA: Diagnosis not present

## 2018-11-22 DIAGNOSIS — Z23 Encounter for immunization: Secondary | ICD-10-CM | POA: Diagnosis not present

## 2018-12-04 DIAGNOSIS — R69 Illness, unspecified: Secondary | ICD-10-CM | POA: Diagnosis not present

## 2018-12-05 DIAGNOSIS — H52203 Unspecified astigmatism, bilateral: Secondary | ICD-10-CM | POA: Diagnosis not present

## 2018-12-05 DIAGNOSIS — H2513 Age-related nuclear cataract, bilateral: Secondary | ICD-10-CM | POA: Diagnosis not present

## 2018-12-11 DIAGNOSIS — G4733 Obstructive sleep apnea (adult) (pediatric): Secondary | ICD-10-CM | POA: Diagnosis not present

## 2019-01-02 DIAGNOSIS — L812 Freckles: Secondary | ICD-10-CM | POA: Diagnosis not present

## 2019-01-02 DIAGNOSIS — L308 Other specified dermatitis: Secondary | ICD-10-CM | POA: Diagnosis not present

## 2019-01-02 DIAGNOSIS — Z85828 Personal history of other malignant neoplasm of skin: Secondary | ICD-10-CM | POA: Diagnosis not present

## 2019-01-02 DIAGNOSIS — L821 Other seborrheic keratosis: Secondary | ICD-10-CM | POA: Diagnosis not present

## 2019-01-02 DIAGNOSIS — L570 Actinic keratosis: Secondary | ICD-10-CM | POA: Diagnosis not present

## 2019-01-11 DIAGNOSIS — G4733 Obstructive sleep apnea (adult) (pediatric): Secondary | ICD-10-CM | POA: Diagnosis not present

## 2019-01-21 DIAGNOSIS — I10 Essential (primary) hypertension: Secondary | ICD-10-CM | POA: Diagnosis not present

## 2019-01-21 DIAGNOSIS — E785 Hyperlipidemia, unspecified: Secondary | ICD-10-CM | POA: Diagnosis not present

## 2019-01-21 DIAGNOSIS — K5730 Diverticulosis of large intestine without perforation or abscess without bleeding: Secondary | ICD-10-CM | POA: Diagnosis not present

## 2019-01-21 DIAGNOSIS — G4733 Obstructive sleep apnea (adult) (pediatric): Secondary | ICD-10-CM | POA: Diagnosis not present

## 2019-01-21 DIAGNOSIS — M129 Arthropathy, unspecified: Secondary | ICD-10-CM | POA: Diagnosis not present

## 2019-01-21 DIAGNOSIS — I499 Cardiac arrhythmia, unspecified: Secondary | ICD-10-CM | POA: Diagnosis not present

## 2019-01-21 DIAGNOSIS — J309 Allergic rhinitis, unspecified: Secondary | ICD-10-CM | POA: Diagnosis not present

## 2019-01-21 DIAGNOSIS — M1990 Unspecified osteoarthritis, unspecified site: Secondary | ICD-10-CM | POA: Diagnosis not present

## 2019-01-21 DIAGNOSIS — K429 Umbilical hernia without obstruction or gangrene: Secondary | ICD-10-CM | POA: Diagnosis not present

## 2019-01-21 DIAGNOSIS — G4700 Insomnia, unspecified: Secondary | ICD-10-CM | POA: Diagnosis not present

## 2019-01-25 DIAGNOSIS — B353 Tinea pedis: Secondary | ICD-10-CM | POA: Diagnosis not present

## 2019-01-25 DIAGNOSIS — E139 Other specified diabetes mellitus without complications: Secondary | ICD-10-CM | POA: Diagnosis not present

## 2019-01-25 DIAGNOSIS — M21612 Bunion of left foot: Secondary | ICD-10-CM | POA: Diagnosis not present

## 2019-01-25 DIAGNOSIS — L602 Onychogryphosis: Secondary | ICD-10-CM | POA: Diagnosis not present

## 2019-01-25 DIAGNOSIS — B351 Tinea unguium: Secondary | ICD-10-CM | POA: Diagnosis not present

## 2019-02-04 DIAGNOSIS — B351 Tinea unguium: Secondary | ICD-10-CM | POA: Diagnosis not present

## 2019-02-11 DIAGNOSIS — G4733 Obstructive sleep apnea (adult) (pediatric): Secondary | ICD-10-CM | POA: Diagnosis not present

## 2019-02-15 DIAGNOSIS — E139 Other specified diabetes mellitus without complications: Secondary | ICD-10-CM | POA: Diagnosis not present

## 2019-02-15 DIAGNOSIS — M2042 Other hammer toe(s) (acquired), left foot: Secondary | ICD-10-CM | POA: Diagnosis not present

## 2019-02-15 DIAGNOSIS — M2041 Other hammer toe(s) (acquired), right foot: Secondary | ICD-10-CM | POA: Diagnosis not present

## 2019-02-15 DIAGNOSIS — B351 Tinea unguium: Secondary | ICD-10-CM | POA: Diagnosis not present

## 2019-03-13 DIAGNOSIS — G4733 Obstructive sleep apnea (adult) (pediatric): Secondary | ICD-10-CM | POA: Diagnosis not present

## 2019-03-18 DIAGNOSIS — Z8546 Personal history of malignant neoplasm of prostate: Secondary | ICD-10-CM | POA: Diagnosis not present

## 2019-03-18 DIAGNOSIS — N393 Stress incontinence (female) (male): Secondary | ICD-10-CM | POA: Diagnosis not present

## 2019-03-18 DIAGNOSIS — N528 Other male erectile dysfunction: Secondary | ICD-10-CM | POA: Diagnosis not present

## 2019-03-19 DIAGNOSIS — M25531 Pain in right wrist: Secondary | ICD-10-CM | POA: Diagnosis not present

## 2019-04-05 DIAGNOSIS — U071 COVID-19: Secondary | ICD-10-CM

## 2019-04-05 HISTORY — DX: COVID-19: U07.1

## 2019-04-05 HISTORY — PX: EYE SURGERY: SHX253

## 2019-04-07 DIAGNOSIS — S52561A Barton's fracture of right radius, initial encounter for closed fracture: Secondary | ICD-10-CM | POA: Diagnosis not present

## 2019-04-08 DIAGNOSIS — S52571A Other intraarticular fracture of lower end of right radius, initial encounter for closed fracture: Secondary | ICD-10-CM | POA: Diagnosis not present

## 2019-04-08 DIAGNOSIS — Y999 Unspecified external cause status: Secondary | ICD-10-CM | POA: Diagnosis not present

## 2019-04-08 DIAGNOSIS — G4733 Obstructive sleep apnea (adult) (pediatric): Secondary | ICD-10-CM | POA: Diagnosis not present

## 2019-04-18 DIAGNOSIS — S52561A Barton's fracture of right radius, initial encounter for closed fracture: Secondary | ICD-10-CM | POA: Diagnosis not present

## 2019-05-01 ENCOUNTER — Ambulatory Visit: Payer: Medicare HMO

## 2019-05-02 DIAGNOSIS — S52561D Barton's fracture of right radius, subsequent encounter for closed fracture with routine healing: Secondary | ICD-10-CM | POA: Diagnosis not present

## 2019-05-02 DIAGNOSIS — Z4789 Encounter for other orthopedic aftercare: Secondary | ICD-10-CM | POA: Diagnosis not present

## 2019-05-02 DIAGNOSIS — M25531 Pain in right wrist: Secondary | ICD-10-CM | POA: Diagnosis not present

## 2019-05-09 ENCOUNTER — Ambulatory Visit: Payer: Medicare HMO | Attending: Internal Medicine

## 2019-05-09 DIAGNOSIS — Z23 Encounter for immunization: Secondary | ICD-10-CM | POA: Insufficient documentation

## 2019-05-09 NOTE — Progress Notes (Signed)
   Covid-19 Vaccination Clinic  Name:  [name]    MRN: [id number] DOB: Aug 31, 1934  05/09/2019  [name] was observed post Covid-19 immunization for 15 minutes without incidence. He was provided with Vaccine Information Sheet and instruction to access the V-Safe system.   [name] was instructed to call 911 with any severe reactions post vaccine: Marland Kitchen Difficulty breathing  . Swelling of your face and throat  . A fast heartbeat  . A bad rash all over your body  . Dizziness and weakness    Immunizations Administered    Name Date Dose VIS Date Route   Pfizer COVID-19 Vaccine 05/09/2019  9:25 AM 0.3 mL 03/15/2019 Intramuscular   Manufacturer: [place]   Lot: [id number]   [place]: [id number]

## 2019-05-15 DIAGNOSIS — M25531 Pain in right wrist: Secondary | ICD-10-CM | POA: Diagnosis not present

## 2019-05-20 ENCOUNTER — Ambulatory Visit: Payer: Medicare HMO | Admitting: Neurology

## 2019-05-20 ENCOUNTER — Other Ambulatory Visit: Payer: Self-pay

## 2019-05-20 ENCOUNTER — Telehealth: Payer: Self-pay

## 2019-05-20 ENCOUNTER — Encounter: Payer: Self-pay | Admitting: Neurology

## 2019-05-20 VITALS — BP 134/76 | HR 73 | Temp 97.2°F | Ht 70.0 in | Wt 191.0 lb

## 2019-05-20 DIAGNOSIS — Z9989 Dependence on other enabling machines and devices: Secondary | ICD-10-CM

## 2019-05-20 DIAGNOSIS — G4733 Obstructive sleep apnea (adult) (pediatric): Secondary | ICD-10-CM

## 2019-05-20 NOTE — Progress Notes (Signed)
Subjective:    Patient ID: [name] is a 85 y.o. male.  HPI     Interim history:   [name] is an 85 year old right-handed gentleman with an underlying medical history of hypertension, hyperlipidemia, degenerative joint disease, prostate cancer, status post radical prostatectomy 1997, history of skin cancer, allergic rhinitis, arthritis, status post left total knee arthroplasty under Dr. Maureen Ralphs in 2012, and overweight state, who presents for follow-up consultation of his obstructive sleep apnea, on CPAP therapy. The patient is unaccompanied today and presents for his yearly checkup. I last saw him on 05/16/2018, at which time he was fully compliant with his CPAP, he was having some trouble with sleep maintenance.   Today, 05/20/2019: I reviewed his CPAP compliance data from 04/16/2019 through 05/15/2019, which is a total of 30 days, during which time he used his machine every night with percent use days greater than 4 hours at 100%, indicating superb compliance with an average usage of 8 hours and 24 minutes, residual AHI at goal at 1.7/h, leak high with a 95th percentile at 49.3 L/min on a pressure of 10 cm.  He uses nasal pillows with a chinstrap, he does not really care for the chinstrap but still tries it.  His wife is bothered by the air sound and from the air blowing, he is willing to try a different mask, he tried a traditional style full facemask in the past.  He unfortunately fell while playing golf, and broke his wrist in January 2021, he had surgery on 04/08/2019, he is in physical therapy and has a follow-up pending with Dr. Apolonio Schneiders.  He had an eye examination and was advised to consider cataract surgeries.  He also needs surgery for his umbilical hernia but has postponed it because of the Covid pandemic.  He received his first vaccine, he is due for his second shot on 06/03/2019.    The patient's allergies, current medications, family history, past medical history, past social history, past  surgical history and problem list were reviewed and updated as appropriate.    Previously (copied from previous notes for reference):      I saw him on 05/10/2017, at which time he was fully compliant with his CPAP and doing well.   I reviewed his CPAP compliance data from 04/15/2018 through 05/14/2018 which is a total of 30 days, during which time he used his CPAP every night with percent used days greater than 4 hours at 100%, indicating superb compliance with an average usage of 8 hours and 13 minutes which is great as well. AHI at goal at 1.4 per hour, leak on the high side with the 95th percentile at 33.5 L/m on a pressure of 10 cm with EPR of 1.   I saw him on 03/10/2016, at which time he reported doing well with CPAP. She was sleeping reasonably well with it. He was fully compliant with treatment and advised to follow-up routinely in one year.    I reviewed his CPAP compliance data from 04/09/2017 through 05/08/2017 which is a total of 30 days, during which time he used his machine every night with percent used days greater than 4 hours at 100%, indicating superb compliance with an average usage of 7 hours and 31 minutes, residual AHI at goal at 2.1 per hour, leak on the high side for the 95th percentile at 30.6 L/m on a pressure of 10 cm with EPR of 1.    I saw him on 09/09/2015, at which time he  reported that he was doing okay with CPAP. He had some discomfort with a nasal pillows and did not like the chin strap. He had some nasal congestion. He had some mouth dryness. He was compliant with CPAP therapy.   I reviewed his CPAP compliance data from 02/08/2016 through 03/08/2016 which is a total of 30 days, during which time he used his machine every night with percent used days greater than 4 hours at 100%, indicating superb compliance with an average usage of 7 hours and 42 minutes, residual AHI 2 per hour, leak on the high side with the 95th percentile at 32.3 L/m on a pressure of 10 cm with  EPR of 1.   I first met him on 05/26/2015 at the request of his primary care physician, at which time he reported snoring and excessive daytime somnolence as well as a family history of obstructive sleep apnea. He had a home sleep test on 06/11/2015 which showed severe obstructive sleep apnea, overall AHI was 34, oxygen desaturation nadir was 79%, time below 88% saturation was 3 hours and 46 minutes for the night. I invited him back for a full night CPAP titration study based on the severity of his test results from the home sleep test. He had a CPAP titration study on 07/07/2015. Sleep efficiency was 72.9%, sleep latency 13 minutes, wake after sleep onset was 108 minutes with mild to moderate sleep fragmentation noted. He had an elevated arousal index, he had an increased percentage of stage II sleep, absence of slow-wave sleep and an increased percentage of REM sleep at 26%, REM latency prolonged at 166.5 minutes. He had severe PLMS with an index of 68 per hour, arousal index from PLMS was 16.5 per hour. He had frequent PACs on EKG. Average oxygen saturation was 88% but improved to about 91% on the final CPAP pressure. CPAP was titrated from 5 cm to 10 cm. AHI was 0 per hour on the pressure of 10 cm. Based on his test results are prescribed CPAP therapy for home use.    I reviewed his CPAP compliance data from 08/09/2015 through 09/07/2015 which is a total of 30 days during which time he used his machine every night with percent used days greater than 4 hours at 100%, indicating superb compliance with an average usage of 7 hours and 47 minutes, residual AHI 3.2 per hour, leak at times high with the 95th percentile at 29.5 L/m on a pressure of 10 cm with EPR of 3.    05/26/2015: He reports snoring and excessive daytime somnolence. He denies a FHx of OSA. His Epworth sleepiness score is 2 out of 24 today, his fatigue score is 2 out of 63 today. He reports having had a sleep study several years ago may be 10+  years ago and per his report it was borderline at the time. He has irregular breathing and brief pauses, per wife's report, and snorting sounds per son's feedback.   He denies morning headaches, but has nocturia 2-3 times per night. He has started having some L knee discomfort but denies RLS symptoms.   He tries to exercise at the [facility] and plays Golf about 3 days a week.   He drinks caffeine in the form of coffee, about 2 cups a day. He drinks about 2 mixed drinks per day, he is a nonsmoker. He retired from [employer] after 40 years. He has 3 children, 7 grandchildren. He lives with his wife. He does not have  trouble falling asleep or staying asleep. I reviewed your office note from 10/22/2014, which you kindly included.  His Past Medical History Is Significant For: Past Medical History:  Diagnosis Date  . Allergy   . Arthritis   . Cancer [facility])    prostate CA 1997, skin CA  . Cataract   . Colon polyp   . GERD (gastroesophageal reflux disease)   . History of colon polyps    adenoma;[facility]  . History of prostate cancer   . History of skin cancer   . HTN (hypertension)   . Other and unspecified hyperlipidemia   . PAC (premature atrial contraction)   . Sleep apnea    uses CPAP    His Past Surgical History Is Significant For: Past Surgical History:  Procedure Laterality Date  . COLONOSCOPY    . COLONOSCOPY W/ POLYPECTOMY  last 2013    X 2; [facility]  . HEMORROIDECTOMY    . PROSTATE SURGERY    . PROSTATECTOMY  1997   Dr Rosana Hoes  . ROTATOR CUFF REPAIR      X 3(L twice); Dr Theda Sers  . TOTAL KNEE ARTHROPLASTY     L; Dr Maureen Ralphs    His Family History Is Significant For: Family History  Problem Relation Age of Onset  . Stroke Mother 45  . Heart disease Father 28       ?MI  . Heart disease Brother        CBAG in 31s  . Dementia Brother        vascular dementia  . Colon cancer Neg Hx   . Stomach cancer Neg Hx   . Diabetes Neg Hx   . Cancer Neg Hx   .  Esophageal cancer Neg Hx   . Rectal cancer Neg Hx     His Social History Is Significant For: Social History   Socioeconomic History  . Marital status: Married    Spouse name: Not on file  . Number of children: 3  . Years of education: BA  . Highest education level: Not on file  Occupational History  . Occupation: Retired  Tobacco Use  . Smoking status: Never Smoker  . Smokeless tobacco: Never Used  Substance and Sexual Activity  . Alcohol use: Yes    Alcohol/week: 14.0 standard drinks    Types: 14 Shots of liquor per week    Comment:  14 drinks /week  . Drug use: No  . Sexual activity: Not on file  Other Topics Concern  . Not on file  Social History Narrative   Drinks 2 cups of coffee a day    Social Determinants of Health   Financial Resource Strain:   . Difficulty of Paying Living Expenses: Not on file  Food Insecurity:   . Worried About Charity fundraiser in the Last Year: Not on file  . Ran Out of Food in the Last Year: Not on file  Transportation Needs:   . Lack of Transportation (Medical): Not on file  . Lack of Transportation (Non-Medical): Not on file  Physical Activity:   . Days of Exercise per Week: Not on file  . Minutes of Exercise per Session: Not on file  Stress:   . Feeling of Stress : Not on file  Social Connections:   . Frequency of Communication with Friends and Family: Not on file  . Frequency of Social Gatherings with Friends and Family: Not on file  . Attends Religious Services: Not on file  . Active  Member of Clubs or Organizations: Not on file  . Attends Archivist Meetings: Not on file  . Marital Status: Not on file    His Allergies Are:  No Known Allergies:   His Current Medications Are:  Outpatient Encounter Medications as of 05/20/2019  Medication Sig  . amLODipine (NORVASC) 10 MG tablet Take 1 tablet (10 mg total) by mouth daily. (Patient taking differently: Take 10 mg by mouth at bedtime. )  . atorvastatin (LIPITOR)  20 MG tablet TAKE ONE AND ONE-HALF TABLETS DAILY (Patient taking differently: Take 30 mg by mouth every morning. TAKE ONE AND ONE-HALF TABLETS DAILY)  . benazepril (LOTENSIN) 40 MG tablet Take 1 tablet (40 mg total) by mouth daily. (Patient taking differently: Take 40 mg by mouth at bedtime. )  . fluticasone (FLONASE) 50 MCG/ACT nasal spray Place 1 spray into both nostrils daily as needed for allergies or rhinitis.  . Glucosamine 750 MG TABS Take 750 mg by mouth daily.  . Loratadine (CLARITIN) 10 MG CAPS Take 10 mg by mouth every morning.   . Melatonin 10 MG TABS Take 10 mg by mouth at bedtime as needed (sleep).  . metoprolol (LOPRESSOR) 50 MG tablet 1/2 by mouth two times daily (Patient taking differently: Take 25 mg by mouth 2 (two) times daily. )  . Multiple Vitamin (MULTIVITAMIN) tablet Take 1 tablet by mouth daily.   Facility-Administered Encounter Medications as of 05/20/2019  Medication  . 0.9 %  sodium chloride infusion  :  Review of Systems:  Out of a complete 14 point review of systems, all are reviewed and negative with the exception of these symptoms as listed below: Review of Systems  Neurological:       Here for 1 year f/u Pt reports he has been doing well.     Objective:  Neurological Exam  Physical Exam Physical Examination:   Vitals:   05/20/19 0933  BP: 134/76  Pulse: 73  Temp: (!) 97.2 F (36.2 C)    General Examination: The patient is a very pleasant 85 y.o. male in no acute distress. He appears well-developed and well-nourished and well groomed.   HEENT:Normocephalic, atraumatic, pupils are equal, round and reactive to light, corrective eyeglasses, Bilateral cataracts noted. Hearing is grossly intact. Face is symmetric, normal facial animation, normal facial sensation. Speech is clear, no hypophonia, no tremor, neck is supple. Airway examination reveals no change, mild mouth dryness.   Chest:Clear to auscultation without wheezing, rhonchi or crackles  noted.  Heart:S1+S2+0, regular and normal without murmurs, rubs or gallops noted.   Abdomen:Soft, non-tender and non-distended.  Extremities:There is no pitting edema in the distal lower extremities bilaterally.  Skin: Warm and dry without trophic changes noted. There are mild varicose veins.  Musculoskeletal: exam reveals well healing scar R wrist.   Neurologically:  Mental status: The patient is awake, alert and oriented in all 4 spheres. His immediate and remote memory, attention, language skills and fund of knowledge are appropriate. There is no evidence of aphasia, agnosia, apraxia or anomia. Speech is clear with normal prosody and enunciation. Thought process is linear. Mood is normal and affect is normal.  Cranial nerves II - XII are as described above under HEENT exam.  Motor exam: Normal bulk, strength and tone is noted. There is no drift, tremor or rebound. Fine motor skills and coordination:Grossly intact in the upper and lower extremities. Cerebellar testing: No dysmetria or intention tremor on finger to nose testing.  Sensory exam: intact to light touch  in the upper and lower extremities.  Gait, station and balance: He stands easily. No veering to one side is noted. No leaning to one side is noted. Posture is age-appropriate and stance is narrow based. Gait shows normal stride length and normal pace. No problems turning are noted.   Assessment and Plan:  In summary, [name] is a very pleasant32 year old male with an underlying medical history of hypertension, hyperlipidemia, degenerative joint disease, prostate cancer, status post radical prostatectomy 1997, history of skin cancer, allergic rhinitis, arthritis, status post left total knee arthroplasty in 2012, Status post Right wrist fracture and surgery in January 2021,and overweight state, whopresents for follow-up consultation of his obstructive sleep apnea which was determined to be severe by ahome sleep test  in March 2017.His overall AHI was 34.9 per hour. He was advised to return for a full night CPAP titration study, which he had on04/07/2015. He has been on CPAP therapy and has done rather well. Hecontinues to be fully compliant. He still has some issues with mouth opening and air leakage from the mouth. He has Been using nasal pillows with a chinstrap.  He could not tolerate a traditional full facemask and went back to using a Nasal pillows interface.  We will try to get him a different style of a full facemask with a sample provided today.  He is commended for his treatment adherence and is advised to routinely follow-up in 1 year.  I will order supplies for his CPAP machine through his DME company [employer].  I answered all his questions today and he was in agreement with the plan. If all goes well he can see one of our nurse practitioners in 1 year. I spent 30 minutes in total face-to-face time and in reviewing records during pre-charting, more than 50% of which was spent in counseling and coordination of care, reviewing test results, reviewing medications and treatment regimen and/or in discussing or reviewing the diagnosis of OSA, the prognosis and treatment options. Pertinent laboratory and imaging test results that were available during this visit with the patient were reviewed by me and considered in my medical decision making (see chart for details).

## 2019-05-20 NOTE — Telephone Encounter (Signed)
Patient had office visit today and is having trouble with this CPAP mask. He currently is wearing nasal pillows with chin strap. He has a very high leak with this. I fitted him with a dreamware Full face mask medium. I gave him medium wide and large cushion to change if he had a leak.  It goes under the nose and covers mouth. It was a great fir. I educated him on the use and how to put on and off. He will call me if he has any questions.

## 2019-05-20 NOTE — Patient Instructions (Signed)

## 2019-05-22 DIAGNOSIS — B351 Tinea unguium: Secondary | ICD-10-CM | POA: Diagnosis not present

## 2019-05-22 DIAGNOSIS — E139 Other specified diabetes mellitus without complications: Secondary | ICD-10-CM | POA: Diagnosis not present

## 2019-05-22 DIAGNOSIS — M2042 Other hammer toe(s) (acquired), left foot: Secondary | ICD-10-CM | POA: Diagnosis not present

## 2019-05-22 DIAGNOSIS — M2041 Other hammer toe(s) (acquired), right foot: Secondary | ICD-10-CM | POA: Diagnosis not present

## 2019-05-30 DIAGNOSIS — Z4789 Encounter for other orthopedic aftercare: Secondary | ICD-10-CM | POA: Diagnosis not present

## 2019-05-30 DIAGNOSIS — M25531 Pain in right wrist: Secondary | ICD-10-CM | POA: Diagnosis not present

## 2019-05-30 DIAGNOSIS — S52561D Barton's fracture of right radius, subsequent encounter for closed fracture with routine healing: Secondary | ICD-10-CM | POA: Diagnosis not present

## 2019-06-03 ENCOUNTER — Ambulatory Visit: Payer: Medicare HMO | Attending: Internal Medicine

## 2019-06-03 DIAGNOSIS — Z23 Encounter for immunization: Secondary | ICD-10-CM | POA: Insufficient documentation

## 2019-06-03 NOTE — Progress Notes (Signed)
   Covid-19 Vaccination Clinic  Name:  [name]    MRN: [id number] DOB: March 05, 1935  06/03/2019  [name] was observed post Covid-19 immunization for 15 minutes without incidence. He was provided with Vaccine Information Sheet and instruction to access the V-Safe system.   [name] was instructed to call 911 with any severe reactions post vaccine: Marland Kitchen Difficulty breathing  . Swelling of your face and throat  . A fast heartbeat  . A bad rash all over your body  . Dizziness and weakness    Immunizations Administered    Name Date Dose VIS Date Route   Pfizer COVID-19 Vaccine 06/03/2019  2:04 PM 0.3 mL 03/15/2019 Intramuscular   Manufacturer: [place]   Lot: [id number]   [place]: [id number]

## 2019-06-11 DIAGNOSIS — R69 Illness, unspecified: Secondary | ICD-10-CM | POA: Diagnosis not present

## 2019-06-13 DIAGNOSIS — M25531 Pain in right wrist: Secondary | ICD-10-CM | POA: Diagnosis not present

## 2019-06-27 DIAGNOSIS — S52561D Barton's fracture of right radius, subsequent encounter for closed fracture with routine healing: Secondary | ICD-10-CM | POA: Diagnosis not present

## 2019-06-27 DIAGNOSIS — Z4789 Encounter for other orthopedic aftercare: Secondary | ICD-10-CM | POA: Diagnosis not present

## 2019-06-27 DIAGNOSIS — L821 Other seborrheic keratosis: Secondary | ICD-10-CM | POA: Diagnosis not present

## 2019-06-27 DIAGNOSIS — L812 Freckles: Secondary | ICD-10-CM | POA: Diagnosis not present

## 2019-06-27 DIAGNOSIS — L570 Actinic keratosis: Secondary | ICD-10-CM | POA: Diagnosis not present

## 2019-07-08 DIAGNOSIS — G4733 Obstructive sleep apnea (adult) (pediatric): Secondary | ICD-10-CM | POA: Diagnosis not present

## 2019-08-06 DIAGNOSIS — Z125 Encounter for screening for malignant neoplasm of prostate: Secondary | ICD-10-CM | POA: Diagnosis not present

## 2019-08-06 DIAGNOSIS — Z0000 Encounter for general adult medical examination without abnormal findings: Secondary | ICD-10-CM | POA: Diagnosis not present

## 2019-08-06 DIAGNOSIS — I10 Essential (primary) hypertension: Secondary | ICD-10-CM | POA: Diagnosis not present

## 2019-08-06 DIAGNOSIS — E7849 Other hyperlipidemia: Secondary | ICD-10-CM | POA: Diagnosis not present

## 2019-08-07 DIAGNOSIS — R82998 Other abnormal findings in urine: Secondary | ICD-10-CM | POA: Diagnosis not present

## 2019-08-07 DIAGNOSIS — I10 Essential (primary) hypertension: Secondary | ICD-10-CM | POA: Diagnosis not present

## 2019-08-08 DIAGNOSIS — Z1212 Encounter for screening for malignant neoplasm of rectum: Secondary | ICD-10-CM | POA: Diagnosis not present

## 2019-08-13 DIAGNOSIS — Z1331 Encounter for screening for depression: Secondary | ICD-10-CM | POA: Diagnosis not present

## 2019-08-13 DIAGNOSIS — E785 Hyperlipidemia, unspecified: Secondary | ICD-10-CM | POA: Diagnosis not present

## 2019-08-13 DIAGNOSIS — G4733 Obstructive sleep apnea (adult) (pediatric): Secondary | ICD-10-CM | POA: Diagnosis not present

## 2019-08-13 DIAGNOSIS — I499 Cardiac arrhythmia, unspecified: Secondary | ICD-10-CM | POA: Diagnosis not present

## 2019-08-13 DIAGNOSIS — G4700 Insomnia, unspecified: Secondary | ICD-10-CM | POA: Diagnosis not present

## 2019-08-13 DIAGNOSIS — M129 Arthropathy, unspecified: Secondary | ICD-10-CM | POA: Diagnosis not present

## 2019-08-13 DIAGNOSIS — K635 Polyp of colon: Secondary | ICD-10-CM | POA: Diagnosis not present

## 2019-08-13 DIAGNOSIS — Z0000 Encounter for general adult medical examination without abnormal findings: Secondary | ICD-10-CM | POA: Diagnosis not present

## 2019-08-13 DIAGNOSIS — Z8546 Personal history of malignant neoplasm of prostate: Secondary | ICD-10-CM | POA: Diagnosis not present

## 2019-08-13 DIAGNOSIS — K5730 Diverticulosis of large intestine without perforation or abscess without bleeding: Secondary | ICD-10-CM | POA: Diagnosis not present

## 2019-08-13 DIAGNOSIS — I10 Essential (primary) hypertension: Secondary | ICD-10-CM | POA: Diagnosis not present

## 2019-09-03 DIAGNOSIS — S52561D Barton's fracture of right radius, subsequent encounter for closed fracture with routine healing: Secondary | ICD-10-CM | POA: Diagnosis not present

## 2019-09-18 DIAGNOSIS — B351 Tinea unguium: Secondary | ICD-10-CM | POA: Diagnosis not present

## 2019-09-30 DIAGNOSIS — G4733 Obstructive sleep apnea (adult) (pediatric): Secondary | ICD-10-CM | POA: Diagnosis not present

## 2019-11-08 DIAGNOSIS — K429 Umbilical hernia without obstruction or gangrene: Secondary | ICD-10-CM | POA: Diagnosis not present

## 2019-11-15 DIAGNOSIS — Z1152 Encounter for screening for COVID-19: Secondary | ICD-10-CM | POA: Diagnosis not present

## 2019-11-15 DIAGNOSIS — R0981 Nasal congestion: Secondary | ICD-10-CM | POA: Diagnosis not present

## 2019-12-06 DIAGNOSIS — H2513 Age-related nuclear cataract, bilateral: Secondary | ICD-10-CM | POA: Diagnosis not present

## 2019-12-06 DIAGNOSIS — H52203 Unspecified astigmatism, bilateral: Secondary | ICD-10-CM | POA: Diagnosis not present

## 2019-12-11 DIAGNOSIS — J309 Allergic rhinitis, unspecified: Secondary | ICD-10-CM | POA: Diagnosis not present

## 2019-12-11 DIAGNOSIS — I10 Essential (primary) hypertension: Secondary | ICD-10-CM | POA: Diagnosis not present

## 2019-12-11 DIAGNOSIS — J0181 Other acute recurrent sinusitis: Secondary | ICD-10-CM | POA: Diagnosis not present

## 2019-12-16 DIAGNOSIS — R69 Illness, unspecified: Secondary | ICD-10-CM | POA: Diagnosis not present

## 2019-12-30 DIAGNOSIS — D485 Neoplasm of uncertain behavior of skin: Secondary | ICD-10-CM | POA: Diagnosis not present

## 2019-12-30 DIAGNOSIS — L820 Inflamed seborrheic keratosis: Secondary | ICD-10-CM | POA: Diagnosis not present

## 2019-12-30 DIAGNOSIS — L570 Actinic keratosis: Secondary | ICD-10-CM | POA: Diagnosis not present

## 2019-12-30 DIAGNOSIS — L821 Other seborrheic keratosis: Secondary | ICD-10-CM | POA: Diagnosis not present

## 2019-12-30 DIAGNOSIS — L812 Freckles: Secondary | ICD-10-CM | POA: Diagnosis not present

## 2019-12-30 DIAGNOSIS — D1801 Hemangioma of skin and subcutaneous tissue: Secondary | ICD-10-CM | POA: Diagnosis not present

## 2019-12-30 DIAGNOSIS — D692 Other nonthrombocytopenic purpura: Secondary | ICD-10-CM | POA: Diagnosis not present

## 2019-12-31 DIAGNOSIS — G4733 Obstructive sleep apnea (adult) (pediatric): Secondary | ICD-10-CM | POA: Diagnosis not present

## 2020-01-04 DIAGNOSIS — Z23 Encounter for immunization: Secondary | ICD-10-CM | POA: Diagnosis not present

## 2020-01-09 DIAGNOSIS — H25811 Combined forms of age-related cataract, right eye: Secondary | ICD-10-CM | POA: Diagnosis not present

## 2020-01-09 DIAGNOSIS — H2511 Age-related nuclear cataract, right eye: Secondary | ICD-10-CM | POA: Diagnosis not present

## 2020-01-18 ENCOUNTER — Ambulatory Visit: Payer: Medicare HMO | Attending: Internal Medicine

## 2020-01-18 DIAGNOSIS — Z23 Encounter for immunization: Secondary | ICD-10-CM

## 2020-01-18 NOTE — Progress Notes (Signed)
° °  Covid-19 Vaccination Clinic  Name:  [name]    MRN: [id number] DOB: February 24, 1935  01/18/2020  [name] was observed post Covid-19 immunization for 15 minutes without incident. He was provided with Vaccine Information Sheet and instruction to access the V-Safe system.   [name] was instructed to call 911 with any severe reactions post vaccine:  Difficulty breathing   Swelling of face and throat   A fast heartbeat   A bad rash all over body   Dizziness and weakness

## 2020-01-23 DIAGNOSIS — H25812 Combined forms of age-related cataract, left eye: Secondary | ICD-10-CM | POA: Diagnosis not present

## 2020-01-23 DIAGNOSIS — H2512 Age-related nuclear cataract, left eye: Secondary | ICD-10-CM | POA: Diagnosis not present

## 2020-02-21 DIAGNOSIS — Z961 Presence of intraocular lens: Secondary | ICD-10-CM | POA: Diagnosis not present

## 2020-02-21 DIAGNOSIS — Z0100 Encounter for examination of eyes and vision without abnormal findings: Secondary | ICD-10-CM | POA: Diagnosis not present

## 2020-03-16 DIAGNOSIS — N529 Male erectile dysfunction, unspecified: Secondary | ICD-10-CM | POA: Diagnosis not present

## 2020-03-16 DIAGNOSIS — N393 Stress incontinence (female) (male): Secondary | ICD-10-CM | POA: Diagnosis not present

## 2020-03-16 DIAGNOSIS — C61 Malignant neoplasm of prostate: Secondary | ICD-10-CM | POA: Diagnosis not present

## 2020-03-20 DIAGNOSIS — M1711 Unilateral primary osteoarthritis, right knee: Secondary | ICD-10-CM | POA: Diagnosis not present

## 2020-03-20 DIAGNOSIS — Z96652 Presence of left artificial knee joint: Secondary | ICD-10-CM | POA: Diagnosis not present

## 2020-03-30 DIAGNOSIS — G4733 Obstructive sleep apnea (adult) (pediatric): Secondary | ICD-10-CM | POA: Diagnosis not present

## 2020-04-01 DIAGNOSIS — Z1152 Encounter for screening for COVID-19: Secondary | ICD-10-CM | POA: Diagnosis not present

## 2020-04-01 DIAGNOSIS — I10 Essential (primary) hypertension: Secondary | ICD-10-CM | POA: Diagnosis not present

## 2020-04-01 DIAGNOSIS — J309 Allergic rhinitis, unspecified: Secondary | ICD-10-CM | POA: Diagnosis not present

## 2020-04-01 DIAGNOSIS — J0181 Other acute recurrent sinusitis: Secondary | ICD-10-CM | POA: Diagnosis not present

## 2020-04-01 DIAGNOSIS — J029 Acute pharyngitis, unspecified: Secondary | ICD-10-CM | POA: Diagnosis not present

## 2020-04-02 ENCOUNTER — Other Ambulatory Visit: Payer: Self-pay | Admitting: Surgery

## 2020-04-02 DIAGNOSIS — K429 Umbilical hernia without obstruction or gangrene: Secondary | ICD-10-CM | POA: Diagnosis not present

## 2020-04-07 DIAGNOSIS — M25462 Effusion, left knee: Secondary | ICD-10-CM | POA: Diagnosis not present

## 2020-04-07 DIAGNOSIS — Z96652 Presence of left artificial knee joint: Secondary | ICD-10-CM | POA: Diagnosis not present

## 2020-04-23 ENCOUNTER — Other Ambulatory Visit: Payer: Self-pay | Admitting: Orthopedic Surgery

## 2020-04-23 ENCOUNTER — Other Ambulatory Visit (HOSPITAL_COMMUNITY): Payer: Self-pay | Admitting: Orthopedic Surgery

## 2020-04-23 DIAGNOSIS — Z96652 Presence of left artificial knee joint: Secondary | ICD-10-CM

## 2020-04-27 ENCOUNTER — Encounter (HOSPITAL_BASED_OUTPATIENT_CLINIC_OR_DEPARTMENT_OTHER): Payer: Self-pay | Admitting: Surgery

## 2020-04-27 ENCOUNTER — Other Ambulatory Visit: Payer: Self-pay

## 2020-04-27 ENCOUNTER — Encounter (HOSPITAL_BASED_OUTPATIENT_CLINIC_OR_DEPARTMENT_OTHER)
Admission: RE | Admit: 2020-04-27 | Discharge: 2020-04-27 | Disposition: A | Discharge status: Home / Self Care | Payer: Medicare HMO | Source: Ambulatory Visit | Attending: Surgery | Admitting: Surgery

## 2020-04-27 DIAGNOSIS — Z01810 Encounter for preprocedural cardiovascular examination: Secondary | ICD-10-CM | POA: Insufficient documentation

## 2020-04-27 MED ORDER — ENSURE PRE-SURGERY PO LIQD: 296.0000 mL | Freq: Once | ORAL | Status: DC

## 2020-04-27 NOTE — Progress Notes (Signed)

## 2020-04-30 ENCOUNTER — Other Ambulatory Visit (HOSPITAL_COMMUNITY)
Admission: RE | Admit: 2020-04-30 | Discharge: 2020-04-30 | Disposition: A | Discharge status: Home / Self Care | Payer: Medicare HMO | Source: Ambulatory Visit | Attending: Surgery | Admitting: Surgery

## 2020-04-30 DIAGNOSIS — Z01812 Encounter for preprocedural laboratory examination: Secondary | ICD-10-CM | POA: Diagnosis not present

## 2020-04-30 DIAGNOSIS — G4733 Obstructive sleep apnea (adult) (pediatric): Secondary | ICD-10-CM | POA: Diagnosis not present

## 2020-04-30 DIAGNOSIS — Z20822 Contact with and (suspected) exposure to covid-19: Secondary | ICD-10-CM | POA: Insufficient documentation

## 2020-04-30 LAB — SARS CORONAVIRUS 2 (TAT 6-24 HRS): SARS Coronavirus 2: NEGATIVE

## 2020-05-01 DIAGNOSIS — M1711 Unilateral primary osteoarthritis, right knee: Secondary | ICD-10-CM | POA: Diagnosis not present

## 2020-05-03 NOTE — H&P (Signed)
   [name]  Location: [facility] Patient #: [id number] DOB: 01-14-1935 Married / Language: English / Race: White Male   History of Present Illness  The patient is a 86 year old male who presents with an umbilical hernia.  Chief complaint: Umbilical hernia  This is a pleasant gentleman who has been followed by Dr. Lucia Gaskins for small umbilical hernia. The patient reports that the hernia is now getting larger and causing some mild discomfort so he is now interested in hernia repair. He has no obstructive symptoms. He is very active and plays golf regularly. He has no other complaints.   Medication History  Meloxicam (15MG  Tablet, Oral) Active. amLODIPine Besylate (10MG  Tablet, Oral) Active. Atorvastatin Calcium (20MG  Tablet, Oral) Active. Benazepril HCl (40MG  Tablet, Oral) Active. Metoprolol Tartrate (50MG  Tablet, Oral) Active. Medications Reconciled  Vitals (Armen Glo Herring CMA;   Weight: 189.13 lb Height: 70in Body Surface Area: 2.04 m Body Mass Index: 27.14 kg/m  Temp.: 97.64F  Pulse: 89 (Regular)  P.OX: 96% (Room air) BP: 132/88(Sitting, Left Arm, Standard)       Physical Exam  The physical exam findings are as follows: Note: He appears well exam  His abdomen is soft. There is a moderate umbilical hernia containing omentum. It was tender because it would mostly reduce it but it popped right back out.    Assessment & Plan  UMBILICAL HERNIA WITHOUT OBSTRUCTION OR GANGRENE (K42.9)  Impression: I reviewed his previous notes. We again discussed the diagnosis of an umbilical hernia. At this point with the getting larger and more symptomatic, repair with mesh is recommended. I believe this can be done through a small open incision as an outpatient. I discussed the surgical procedure in detail. We discussed the risk which includes but is not limited to bleeding, infection, use of mesh, hernia recurrence, injury to surrounding structures,  the postoperative recovery period, cardiopulmonary issues, etc. He understands and wishes to proceed with surgery

## 2020-05-04 ENCOUNTER — Ambulatory Visit (HOSPITAL_BASED_OUTPATIENT_CLINIC_OR_DEPARTMENT_OTHER)
Admission: RE | Admit: 2020-05-04 | Discharge: 2020-05-04 | Disposition: A | Discharge status: Home / Self Care | Payer: Medicare HMO | Attending: Surgery | Admitting: Surgery

## 2020-05-04 ENCOUNTER — Other Ambulatory Visit: Payer: Self-pay

## 2020-05-04 ENCOUNTER — Encounter (HOSPITAL_BASED_OUTPATIENT_CLINIC_OR_DEPARTMENT_OTHER)
Admission: RE | Disposition: A | Discharge status: Home / Self Care | Payer: Self-pay | Source: Home / Self Care | Attending: Surgery

## 2020-05-04 ENCOUNTER — Ambulatory Visit (HOSPITAL_BASED_OUTPATIENT_CLINIC_OR_DEPARTMENT_OTHER): Payer: Medicare HMO | Admitting: Anesthesiology

## 2020-05-04 ENCOUNTER — Encounter (HOSPITAL_BASED_OUTPATIENT_CLINIC_OR_DEPARTMENT_OTHER): Payer: Self-pay | Admitting: Surgery

## 2020-05-04 DIAGNOSIS — Z79899 Other long term (current) drug therapy: Secondary | ICD-10-CM | POA: Diagnosis not present

## 2020-05-04 DIAGNOSIS — Z791 Long term (current) use of non-steroidal anti-inflammatories (NSAID): Secondary | ICD-10-CM | POA: Diagnosis not present

## 2020-05-04 DIAGNOSIS — K429 Umbilical hernia without obstruction or gangrene: Secondary | ICD-10-CM | POA: Diagnosis not present

## 2020-05-04 DIAGNOSIS — K219 Gastro-esophageal reflux disease without esophagitis: Secondary | ICD-10-CM | POA: Diagnosis not present

## 2020-05-04 DIAGNOSIS — E7849 Other hyperlipidemia: Secondary | ICD-10-CM | POA: Diagnosis not present

## 2020-05-04 DIAGNOSIS — I10 Essential (primary) hypertension: Secondary | ICD-10-CM | POA: Diagnosis not present

## 2020-05-04 HISTORY — PX: INSERTION OF MESH: SHX5868

## 2020-05-04 HISTORY — PX: UMBILICAL HERNIA REPAIR: SHX196

## 2020-05-04 SURGERY — REPAIR, HERNIA, UMBILICAL, ADULT
Anesthesia: General | Site: Abdomen

## 2020-05-04 MED ORDER — DEXAMETHASONE SODIUM PHOSPHATE 10 MG/ML IJ SOLN
INTRAMUSCULAR | Status: AC
  Filled 2020-05-04: qty 1

## 2020-05-04 MED ORDER — CEFAZOLIN SODIUM-DEXTROSE 2-4 GM/100ML-% IV SOLN
2.0000 g | INTRAVENOUS | Status: AC
  Administered 2020-05-04: 2 g via INTRAVENOUS

## 2020-05-04 MED ORDER — ACETAMINOPHEN 500 MG PO TABS
1000.0000 mg | ORAL_TABLET | ORAL | Status: AC
  Administered 2020-05-04: 1000 mg via ORAL

## 2020-05-04 MED ORDER — ONDANSETRON HCL 4 MG/2ML IJ SOLN: 4.0000 mg | Freq: Once | INTRAMUSCULAR | Status: DC | PRN

## 2020-05-04 MED ORDER — LACTATED RINGERS IV SOLN: INTRAVENOUS | Status: DC

## 2020-05-04 MED ORDER — ACETAMINOPHEN 500 MG PO TABS: 1000.0000 mg | ORAL_TABLET | Freq: Once | ORAL | Status: DC

## 2020-05-04 MED ORDER — FENTANYL CITRATE (PF) 100 MCG/2ML IJ SOLN: 25.0000 ug | INTRAMUSCULAR | Status: DC | PRN

## 2020-05-04 MED ORDER — CEFAZOLIN SODIUM-DEXTROSE 2-4 GM/100ML-% IV SOLN
INTRAVENOUS | Status: AC
  Filled 2020-05-04: qty 100

## 2020-05-04 MED ORDER — ONDANSETRON HCL 4 MG/2ML IJ SOLN
INTRAMUSCULAR | Status: DC | PRN
  Administered 2020-05-04: 4 mg via INTRAVENOUS

## 2020-05-04 MED ORDER — LIDOCAINE HCL (CARDIAC) PF 100 MG/5ML IV SOSY
PREFILLED_SYRINGE | INTRAVENOUS | Status: DC | PRN
  Administered 2020-05-04: 60 mg via INTRATRACHEAL

## 2020-05-04 MED ORDER — ACETAMINOPHEN 500 MG PO TABS
ORAL_TABLET | ORAL | Status: AC
  Filled 2020-05-04: qty 2

## 2020-05-04 MED ORDER — PROPOFOL 10 MG/ML IV BOLUS
INTRAVENOUS | Status: AC
  Filled 2020-05-04: qty 40

## 2020-05-04 MED ORDER — PROPOFOL 10 MG/ML IV BOLUS
INTRAVENOUS | Status: DC | PRN
  Administered 2020-05-04: 150 mg via INTRAVENOUS

## 2020-05-04 MED ORDER — FENTANYL CITRATE (PF) 100 MCG/2ML IJ SOLN
INTRAMUSCULAR | Status: AC
  Filled 2020-05-04: qty 2

## 2020-05-04 MED ORDER — FENTANYL CITRATE (PF) 100 MCG/2ML IJ SOLN
INTRAMUSCULAR | Status: DC | PRN
  Administered 2020-05-04: 25 ug via INTRAVENOUS
  Administered 2020-05-04: 50 ug via INTRAVENOUS
  Administered 2020-05-04: 25 ug via INTRAVENOUS

## 2020-05-04 MED ORDER — CHLORHEXIDINE GLUCONATE CLOTH 2 % EX PADS: 6.0000 | MEDICATED_PAD | Freq: Once | CUTANEOUS | Status: DC

## 2020-05-04 MED ORDER — EPHEDRINE SULFATE 50 MG/ML IJ SOLN
INTRAMUSCULAR | Status: DC | PRN
  Administered 2020-05-04 (×2): 5 mg via INTRAVENOUS
  Administered 2020-05-04: 10 mg via INTRAVENOUS

## 2020-05-04 MED ORDER — ONDANSETRON HCL 4 MG/2ML IJ SOLN
INTRAMUSCULAR | Status: AC
  Filled 2020-05-04: qty 2

## 2020-05-04 MED ORDER — PHENYLEPHRINE HCL (PRESSORS) 10 MG/ML IV SOLN
INTRAVENOUS | Status: DC | PRN
  Administered 2020-05-04: 80 ug via INTRAVENOUS

## 2020-05-04 MED ORDER — TRAMADOL HCL 50 MG PO TABS: 50.0000 mg | ORAL_TABLET | Freq: Four times a day (QID) | ORAL | 0 refills | Status: DC | PRN

## 2020-05-04 MED ORDER — BUPIVACAINE-EPINEPHRINE 0.5% -1:200000 IJ SOLN
INTRAMUSCULAR | Status: DC | PRN
  Administered 2020-05-04: 20 mL

## 2020-05-04 MED ORDER — DEXAMETHASONE SODIUM PHOSPHATE 10 MG/ML IJ SOLN
INTRAMUSCULAR | Status: DC | PRN
  Administered 2020-05-04: 8 mg via INTRAVENOUS

## 2020-05-04 SURGICAL SUPPLY — 45 items
ADH SKN CLS APL DERMABOND .7 (GAUZE/BANDAGES/DRESSINGS) ×1
APL PRP STRL LF DISP 70% ISPRP (MISCELLANEOUS) ×1
BLADE CLIPPER SURG (BLADE) ×1 IMPLANT
BLADE SURG 15 STRL LF DISP TIS (BLADE) ×1 IMPLANT
BLADE SURG 15 STRL SS (BLADE) ×2
CANISTER SUCT 1200ML W/VALVE (MISCELLANEOUS) IMPLANT
CHLORAPREP W/TINT 26 (MISCELLANEOUS) ×2 IMPLANT
COVER BACK TABLE 60X90IN (DRAPES) ×2 IMPLANT
COVER MAYO STAND STRL (DRAPES) ×2 IMPLANT
COVER WAND RF STERILE (DRAPES) IMPLANT
DECANTER SPIKE VIAL GLASS SM (MISCELLANEOUS) IMPLANT
DERMABOND ADVANCED (GAUZE/BANDAGES/DRESSINGS) ×1
DERMABOND ADVANCED .7 DNX12 (GAUZE/BANDAGES/DRESSINGS) ×2 IMPLANT
DRAPE LAPAROTOMY 100X72 PEDS (DRAPES) ×2 IMPLANT
DRAPE UTILITY XL STRL (DRAPES) ×2 IMPLANT
DRSG TEGADERM 2-3/8X2-3/4 SM (GAUZE/BANDAGES/DRESSINGS) IMPLANT
ELECT REM PT RETURN 9FT ADLT (ELECTROSURGICAL) ×2
ELECTRODE REM PT RTRN 9FT ADLT (ELECTROSURGICAL) ×1 IMPLANT
GLOVE SURG ENC MOIS LTX SZ6.5 (GLOVE) ×1 IMPLANT
GLOVE SURG ENC MOIS LTX SZ7.5 (GLOVE) ×1 IMPLANT
GLOVE SURG SIGNA 7.5 PF LTX (GLOVE) ×2 IMPLANT
GOWN STRL REUS W/ TWL LRG LVL3 (GOWN DISPOSABLE) ×1 IMPLANT
GOWN STRL REUS W/ TWL XL LVL3 (GOWN DISPOSABLE) ×1 IMPLANT
GOWN STRL REUS W/TWL LRG LVL3 (GOWN DISPOSABLE) ×2
GOWN STRL REUS W/TWL XL LVL3 (GOWN DISPOSABLE) ×4
MESH VENTRALEX ST 1-7/10 CRC S (Mesh General) ×1 IMPLANT
NDL HYPO 25X1 1.5 SAFETY (NEEDLE) ×1 IMPLANT
NEEDLE HYPO 25X1 1.5 SAFETY (NEEDLE) ×2 IMPLANT
NS IRRIG 1000ML POUR BTL (IV SOLUTION) IMPLANT
PACK BASIN DAY SURGERY FS (CUSTOM PROCEDURE TRAY) ×2 IMPLANT
PENCIL SMOKE EVACUATOR (MISCELLANEOUS) ×2 IMPLANT
SLEEVE SCD COMPRESS KNEE MED (MISCELLANEOUS) ×2 IMPLANT
SPONGE LAP 4X18 RFD (DISPOSABLE) IMPLANT
SUT MNCRL AB 4-0 PS2 18 (SUTURE) ×2 IMPLANT
SUT NOVA 0 T19/GS 22DT (SUTURE) IMPLANT
SUT NOVA NAB DX-16 0-1 5-0 T12 (SUTURE) IMPLANT
SUT NOVA NAB GS-21 1 T12 (SUTURE) ×1 IMPLANT
SUT VIC AB 2-0 SH 27 (SUTURE)
SUT VIC AB 2-0 SH 27XBRD (SUTURE) IMPLANT
SUT VIC AB 3-0 SH 27 (SUTURE) ×2
SUT VIC AB 3-0 SH 27X BRD (SUTURE) ×1 IMPLANT
SYR CONTROL 10ML LL (SYRINGE) ×2 IMPLANT
TOWEL GREEN STERILE FF (TOWEL DISPOSABLE) ×2 IMPLANT
TUBE CONNECTING 20X1/4 (TUBING) IMPLANT
YANKAUER SUCT BULB TIP NO VENT (SUCTIONS) IMPLANT

## 2020-05-04 NOTE — Anesthesia Postprocedure Evaluation (Signed)
Anesthesia Post Note  Patient: [name]  Procedure(s) Performed: UMBILICAL HERNIA REPAIR WITH MESH (N/A Abdomen) INSERTION OF MESH (N/A Abdomen)     Patient location during evaluation: PACU Anesthesia Type: General Level of consciousness: awake and alert, oriented and patient cooperative Pain management: pain level controlled Vital Signs Assessment: post-procedure vital signs reviewed and stable Respiratory status: spontaneous breathing, nonlabored ventilation and respiratory function stable Cardiovascular status: blood pressure returned to baseline and stable Postop Assessment: no apparent nausea or vomiting Anesthetic complications: no   No complications documented.  Last Vitals:  Vitals:   05/04/20 0917 05/04/20 0930  BP: 133/64 135/68  Pulse: 71 75  Resp: 12 13  Temp: 36.6 C   SpO2: 100% 100%    Last Pain:  Vitals:   05/04/20 0917  TempSrc:   PainSc: 0-No pain                 Pervis Hocking

## 2020-05-04 NOTE — Anesthesia Procedure Notes (Signed)
Procedure Name: LMA Insertion Date/Time: 05/04/2020 8:34 AM Performed by: Ezequiel Kayser, CRNA Pre-anesthesia Checklist: Patient identified, Emergency Drugs available, Suction available and Patient being monitored Patient Re-evaluated:Patient Re-evaluated prior to induction Oxygen Delivery Method: Circle System Utilized Preoxygenation: Pre-oxygenation with 100% oxygen Induction Type: IV induction Ventilation: Mask ventilation without difficulty LMA: LMA inserted LMA Size: 5.0 Number of attempts: 1 Airway Equipment and Method: Bite block Placement Confirmation: positive ETCO2 Tube secured with: Tape Dental Injury: Teeth and Oropharynx as per pre-operative assessment  Comments: Eyes taped prior to insertion

## 2020-05-04 NOTE — Transfer of Care (Signed)
Immediate Anesthesia Transfer of Care Note  Patient: [name]  Procedure(s) Performed: UMBILICAL HERNIA REPAIR WITH MESH (N/A Abdomen) INSERTION OF MESH (N/A Abdomen)  Patient Location: PACU  Anesthesia Type:General  Level of Consciousness: awake, alert  and oriented  Airway & Oxygen Therapy: Patient Spontanous Breathing and Patient connected to face mask oxygen  Post-op Assessment: Report given to RN and Post -op Vital signs reviewed and stable  Post vital signs: Reviewed and stable  Last Vitals:  Vitals Value Taken Time  BP    Temp    Pulse 71 05/04/20 0918  Resp 0 05/04/20 0918  SpO2 100 % 05/04/20 0918  Vitals shown include unvalidated device data.  Last Pain:  Vitals:   05/04/20 0715  TempSrc: Oral  PainSc: 0-No pain      Patients Stated Pain Goal: 4 (85/88/50 2774)  Complications: No complications documented.

## 2020-05-04 NOTE — Op Note (Signed)
Surgeon: Coralie Keens, MD  Assistants: Berniece Salines, MD--resident  Anesthesia: General LMA anesthesia  ASA Class: 2   Operation: Umbilical hernia repair with mesh  Indication: Symptomatic umbilical hernia  Findings: - Small fat containing umbilical hernia with defect ~1 cm  Operative details: The patient was placed supine on the table and general anesthesia induced via laryngeal mask airway. The abdomen was prepped and draped. Following timeout, a 2 cm inferior umbilical rim incision was made and 0.5% bupivacaine with epinephrine was injected. The incision was deepened and the hernia sac was encountered and freed from surrounding umbilical skin and circumferentially mobilized with blunt dissection and electrocautery. The hernia sac was resected along with omentum within it. The remaining omentum was reduced into the peritoneal cavity. A 4.2 cm Ventralight mesh was inserted in the underlay position and anchored to the surrounding fascia with interrupted 1-0 Novofil sutures. A figure-of-eight Novofil stitch reapproximated the fascia overlying the mesh. More local anesthetic was infiltrated for a total of 20 mL. A 3-0 Vicryl stitch was placed to tack the umbilicus to the fascia to recreate the umbilicus. Interrupted 3-0 Vicryl deep dermal stitches and 4-0 running Monocryl subcuticular stiches completed the closure. Skin glue was applied. The patient was awakened and transferred to the recovery room.   Complications: None  Specimen: None

## 2020-05-04 NOTE — Discharge Instructions (Signed)
[facility] _______[facility]  UMBILICAL OR INGUINAL HERNIA REPAIR: POST OP INSTRUCTIONS  Always review your discharge instruction sheet given to you by the facility where your surgery was performed. IF YOU HAVE DISABILITY OR FAMILY LEAVE FORMS, YOU MUST BRING THEM TO THE OFFICE FOR PROCESSING.   DO NOT GIVE THEM TO YOUR DOCTOR.  1. A  prescription for pain medication may be given to you upon discharge.  Take your pain medication as prescribed, if needed.  If narcotic pain medicine is not needed, then you may take acetaminophen (Tylenol) or ibuprofen (Advil) as needed. 2. Take your usually prescribed medications unless otherwise directed. If you need a refill on your pain medication, please contact your pharmacy.  They will contact our office to request authorization. Prescriptions will not be filled after 5 pm or on week-ends. 3. You should follow a light diet the first 24 hours after arrival home, such as soup and crackers, etc.  Be sure to include lots of fluids daily.  Resume your normal diet the day after surgery. 4.Most patients will experience some swelling and bruising around the umbilicus or in the groin and scrotum.  Ice packs and reclining will help.  Swelling and bruising can take several days to resolve.  6. It is common to experience some constipation if taking pain medication after surgery.  Increasing fluid intake and taking a stool softener (such as Colace) will usually help or prevent this problem from occurring.  A mild laxative (Milk of Magnesia or Miralax) should be taken according to package directions if there are no bowel movements after 48 hours. 7. Unless discharge instructions indicate otherwise, you may remove your bandages 24-48 hours after surgery, and you may shower at that time.  You may have steri-strips (small skin tapes) in place directly over the incision.  These strips should be left on the skin for 7-10 days.  If your surgeon used skin glue on the  incision, you may shower in 24 hours.  The glue will flake off over the next 2-3 weeks.  Any sutures or staples will be removed at the office during your follow-up visit. 8. ACTIVITIES:  You may resume regular (light) daily activities beginning the next day--such as daily self-care, walking, climbing stairs--gradually increasing activities as tolerated.  You may have sexual intercourse when it is comfortable.  Refrain from any heavy lifting or straining until approved by your doctor.  a.You may drive when you are no longer taking prescription pain medication, you can comfortably wear a seatbelt, and you can safely maneuver your car and apply brakes. b.RETURN TO WORK:   _____________________________________________  9.You should see your doctor in the office for a follow-up appointment approximately 2-3 weeks after your surgery.  Make sure that you call for this appointment within a day or two after you arrive home to insure a convenient appointment time. 10.OTHER INSTRUCTIONS: _OK TO SHOWER STARTING TOMORROW ICE PACK, TYLENOL, AND IBUPROFEN ALSO FOR PAIN NO LIFTING MORE THAN 15 POUNDS FOR 4 WEEKS________________________    _____________________________________  WHEN TO CALL YOUR DOCTOR: 1. Fever over 101.0 2. Inability to urinate 3. Nausea and/or vomiting 4. Extreme swelling or bruising 5. Continued bleeding from incision. 6. Increased pain, redness, or drainage from the incision  The clinic staff is available to answer your questions during regular business hours.  Please don't hesitate to call and ask to speak to one of the nurses for clinical concerns.  If you have a medical emergency, go to the nearest   emergency room or call 911.  A surgeon from [facility] is always on call at the hospital   [address], [place], [place], [place]  [address] ?  [address], [place], [place]   [address] [phone] ? [phone] ? FAX [phone] Web site:  [email or web address]    1000mg  of tylenol given at 7:20 am on 05/04/20    Post Anesthesia Home Care Instructions  Activity: Get plenty of rest for the remainder of the day. A responsible individual must stay with you for 24 hours following the procedure.  For the next 24 hours, DO NOT: -Drive a car -Paediatric nurse -Drink alcoholic beverages -Take any medication unless instructed by your physician -Make any legal decisions or sign important papers.  Meals: Start with liquid foods such as gelatin or soup. Progress to regular foods as tolerated. Avoid greasy, spicy, heavy foods. If nausea and/or vomiting occur, drink only clear liquids until the nausea and/or vomiting subsides. Call your physician if vomiting continues.  Special Instructions/Symptoms: Your throat may feel dry or sore from the anesthesia or the breathing tube placed in your throat during surgery. If this causes discomfort, gargle with warm salt water. The discomfort should disappear within 24 hours.  If you had a scopolamine patch placed behind your ear for the management of post- operative nausea and/or vomiting:  1. The medication in the patch is effective for 72 hours, after which it should be removed.  Wrap patch in a tissue and discard in the trash. Wash hands thoroughly with soap and water. 2. You may remove the patch earlier than 72 hours if you experience unpleasant side effects which may include dry mouth, dizziness or visual disturbances. 3. Avoid touching the patch. Wash your hands with soap and water after contact with the patch.

## 2020-05-04 NOTE — Interval H&P Note (Signed)
History and Physical Interval Note:no change in H and P  05/04/2020 7:11 AM  [name]  has presented today for surgery, with the diagnosis of UMBILICAL HERNIA.  The various methods of treatment have been discussed with the patient and family. After consideration of risks, benefits and other options for treatment, the patient has consented to  Procedure(s) with comments: [place] (N/A) - LMA as a surgical intervention.  The patient's history has been reviewed, patient examined, no change in status, stable for surgery.  I have reviewed the patient's chart and labs.  Questions were answered to the patient's satisfaction.     Coralie Keens

## 2020-05-04 NOTE — Anesthesia Preprocedure Evaluation (Addendum)
Anesthesia Evaluation  Patient identified by MRN, date of birth, ID band Patient awake    Reviewed: Allergy & Precautions, H&P , NPO status , Patient's Chart, lab work & pertinent test results, reviewed documented beta blocker date and time   Airway Mallampati: I  TM Distance: >3 FB Neck ROM: Full    Dental no notable dental hx. (+) Teeth Intact, Dental Advisory Given, Caps   Pulmonary sleep apnea and Continuous Positive Airway Pressure Ventilation ,    Pulmonary exam normal breath sounds clear to auscultation       Cardiovascular hypertension, Pt. on medications and Pt. on home beta blockers Normal cardiovascular exam Rhythm:Regular Rate:Normal     Neuro/Psych negative neurological ROS  negative psych ROS   GI/Hepatic GERD  Controlled,(+)     substance abuse  alcohol use,   Endo/Other  negative endocrine ROS  Renal/GU negative Renal ROS  negative genitourinary   Musculoskeletal  (+) Arthritis , Osteoarthritis,    Abdominal   Peds negative pediatric ROS (+)  Hematology negative hematology ROS (+)   Anesthesia Other Findings   Reproductive/Obstetrics negative OB ROS                            Anesthesia Physical Anesthesia Plan  ASA: II  Anesthesia Plan: General   Post-op Pain Management:    Induction: Intravenous  PONV Risk Score and Plan: 2 and Ondansetron, Dexamethasone and Treatment may vary due to age or medical condition  Airway Management Planned: LMA  Additional Equipment: None  Intra-op Plan:   Post-operative Plan: Extubation in OR  Informed Consent: I have reviewed the patients History and Physical, chart, labs and discussed the procedure including the risks, benefits and alternatives for the proposed anesthesia with the patient or authorized representative who has indicated his/her understanding and acceptance.     Dental advisory given  Plan Discussed with:  CRNA  Anesthesia Plan Comments:         Anesthesia Quick Evaluation

## 2020-05-05 ENCOUNTER — Encounter (HOSPITAL_BASED_OUTPATIENT_CLINIC_OR_DEPARTMENT_OTHER): Payer: Self-pay | Admitting: Surgery

## 2020-05-14 ENCOUNTER — Ambulatory Visit (HOSPITAL_COMMUNITY)
Admission: RE | Admit: 2020-05-14 | Discharge: 2020-05-14 | Disposition: A | Discharge status: Home / Self Care | Payer: Medicare HMO | Source: Ambulatory Visit | Attending: Orthopedic Surgery | Admitting: Orthopedic Surgery

## 2020-05-14 ENCOUNTER — Other Ambulatory Visit: Payer: Self-pay

## 2020-05-14 ENCOUNTER — Encounter (HOSPITAL_COMMUNITY)
Admission: RE | Admit: 2020-05-14 | Discharge: 2020-05-14 | Disposition: A | Discharge status: Home / Self Care | Payer: Medicare HMO | Source: Ambulatory Visit | Attending: Orthopedic Surgery | Admitting: Orthopedic Surgery

## 2020-05-14 DIAGNOSIS — Z96652 Presence of left artificial knee joint: Secondary | ICD-10-CM | POA: Diagnosis not present

## 2020-05-14 DIAGNOSIS — M12861 Other specific arthropathies, not elsewhere classified, right knee: Secondary | ICD-10-CM | POA: Diagnosis not present

## 2020-05-14 MED ORDER — TECHNETIUM TC 99M MEDRONATE IV KIT
20.0000 | PACK | Freq: Once | INTRAVENOUS | Status: AC | PRN
  Administered 2020-05-14: 20 via INTRAVENOUS

## 2020-05-20 ENCOUNTER — Ambulatory Visit: Payer: Medicare HMO | Admitting: Adult Health

## 2020-05-20 ENCOUNTER — Other Ambulatory Visit: Payer: Self-pay

## 2020-05-20 ENCOUNTER — Encounter: Payer: Self-pay | Admitting: Adult Health

## 2020-05-20 VITALS — BP 136/77 | HR 74 | Ht 70.0 in | Wt 196.0 lb

## 2020-05-20 DIAGNOSIS — G4733 Obstructive sleep apnea (adult) (pediatric): Secondary | ICD-10-CM

## 2020-05-20 DIAGNOSIS — Z9989 Dependence on other enabling machines and devices: Secondary | ICD-10-CM | POA: Diagnosis not present

## 2020-05-20 NOTE — Progress Notes (Addendum)
PATIENT: [name] DOB: 1934/08/24  REASON FOR VISIT: follow up HISTORY FROM: patient  HISTORY OF PRESENT ILLNESS: Today 05/20/20:  [name] is an 86 year old male with a history of obstructive sleep apnea on CPAP.  He reports that the CPAP works well for him.  He does note that he is a mouth breather and he is wearing the nasal pillows.  He states that his wife hears the air blowing out of his mouth.  He has used a chinstrap in the past but it does not work efficiently.  He is willing to try a different style of mask.  He does not like the full facemask.  Compliance Report Usage 04/20/2020 - 05/19/2020 Usage days 30/30 days (100%) >= 4 hours 30 days (100%) Average usage (days used) 7 hours 56 minutes  AirSense 10 CPAP Serial number [id number] Mode CPAP Set pressure 10 cmH2O EPR Fulltime EPR level 1  Therapy Leaks - L/min 95th percentile: 45.9 Events per hour AHI: 1.3   REVIEW OF SYSTEMS: Out of a complete 14 system review of symptoms, the patient complains only of the following symptoms, and all other reviewed systems are negative.  FSS 10 ESS 0  ALLERGIES: No Known Allergies  HOME MEDICATIONS: Outpatient Medications Prior to Visit  Medication Sig Dispense Refill  . amLODipine (NORVASC) 10 MG tablet Take 1 tablet (10 mg total) by mouth daily. (Patient taking differently: Take 10 mg by mouth at bedtime.) 90 tablet 3  . atorvastatin (LIPITOR) 20 MG tablet TAKE ONE AND ONE-HALF TABLETS DAILY (Patient taking differently: Take 30 mg by mouth every morning. TAKE ONE AND ONE-HALF TABLETS DAILY) 135 tablet 3  . benazepril (LOTENSIN) 40 MG tablet Take 1 tablet (40 mg total) by mouth daily. (Patient taking differently: Take 40 mg by mouth at bedtime.) 90 tablet 3  . fluticasone (FLONASE) 50 MCG/ACT nasal spray Place 1 spray into both nostrils daily as needed for allergies or rhinitis.    . Glucosamine 750 MG TABS Take 750 mg by mouth daily.    . Loratadine 10 MG CAPS Take  10 mg by mouth every morning.     . Melatonin 10 MG TABS Take 10 mg by mouth at bedtime as needed (sleep).    . meloxicam (MOBIC) 15 MG tablet Take 15 mg by mouth daily.    . metoprolol (LOPRESSOR) 50 MG tablet 1/2 by mouth two times daily (Patient taking differently: Take 25 mg by mouth 2 (two) times daily.) 90 tablet 3  . Multiple Vitamin (MULTIVITAMIN) tablet Take 1 tablet by mouth daily.    . traMADol (ULTRAM) 50 MG tablet Take 1 tablet (50 mg total) by mouth every 6 (six) hours as needed for moderate pain or severe pain. 20 tablet 0   Facility-Administered Medications Prior to Visit  Medication Dose Route Frequency Provider Last Rate Last Admin  . 0.9 %  sodium chloride infusion  500 mL Intravenous Continuous Ladene Artist, MD        PAST MEDICAL HISTORY: Past Medical History:  Diagnosis Date  . Allergy   . Arthritis   . Cancer [facility])    prostate CA 1997, skin CA  . Cataract   . Colon polyp   . GERD (gastroesophageal reflux disease)   . History of colon polyps    adenoma;[facility]  . History of prostate cancer   . History of skin cancer   . HTN (hypertension)   . Other and unspecified hyperlipidemia   . PAC (premature  atrial contraction)   . Sleep apnea    uses CPAP    PAST SURGICAL HISTORY: Past Surgical History:  Procedure Laterality Date  . COLONOSCOPY    . COLONOSCOPY W/ POLYPECTOMY  last 2013    X 2; [facility]  . HEMORROIDECTOMY    . INSERTION OF MESH N/A 05/04/2020   Procedure: INSERTION OF MESH;  Surgeon: Coralie Keens, MD;  Location: [facility];  Service: General;  Laterality: N/A;  . PROSTATE SURGERY    . PROSTATECTOMY  1997   Dr Rosana Hoes  . ROTATOR CUFF REPAIR      X 3(L twice); Dr Theda Sers  . TOTAL KNEE ARTHROPLASTY     L; Dr Maureen Ralphs  . UMBILICAL HERNIA REPAIR N/A 05/04/2020   Procedure: UMBILICAL HERNIA REPAIR WITH MESH;  Surgeon: Coralie Keens, MD;  Location: [facility];  Service: General;  Laterality: N/A;  LMA     FAMILY HISTORY: Family History  Problem Relation Age of Onset  . Stroke Mother 74  . Heart disease Father 6       ?MI  . Heart disease Brother        CBAG in 20s  . Dementia Brother        vascular dementia  . Colon cancer Neg Hx   . Stomach cancer Neg Hx   . Diabetes Neg Hx   . Cancer Neg Hx   . Esophageal cancer Neg Hx   . Rectal cancer Neg Hx     SOCIAL HISTORY: Social History   Socioeconomic History  . Marital status: Married    Spouse name: Not on file  . Number of children: 3  . Years of education: BA  . Highest education level: Not on file  Occupational History  . Occupation: Retired  Tobacco Use  . Smoking status: Never Smoker  . Smokeless tobacco: Never Used  Substance and Sexual Activity  . Alcohol use: Yes    Alcohol/week: 14.0 standard drinks    Types: 14 Shots of liquor per week    Comment:  14 drinks /week  . Drug use: No  . Sexual activity: Not on file  Other Topics Concern  . Not on file  Social History Narrative   Drinks 2 cups of coffee a day    Social Determinants of Health   Financial Resource Strain: Not on file  Food Insecurity: Not on file  Transportation Needs: Not on file  Physical Activity: Not on file  Stress: Not on file  Social Connections: Not on file  Intimate Partner Violence: Not on file      PHYSICAL EXAM  Vitals:   05/20/20 0905  BP: 136/77  Pulse: 74  Weight: 196 lb (88.9 kg)  Height: $Remove'5\' 10"'EMPLybw$  (1.778 m)   Body mass index is 28.12 kg/m.  Generalized: Well developed, in no acute distress  Chest: Lungs clear to auscultation bilaterally  Neurological examination  Mentation: Alert oriented to time, place, history taking. Follows all commands speech and language fluent Cranial nerve II-XII: Extraocular movements were full, visual field were full on confrontational test Head turning and shoulder shrug  were normal and symmetric. Motor: The motor testing reveals 5 over 5 strength of all 4 extremities. Good  symmetric motor tone is noted throughout.  Sensory: Sensory testing is intact to soft touch on all 4 extremities. No evidence of extinction is noted.  Gait and station: Gait is normal.    DIAGNOSTIC DATA (LABS, IMAGING, TESTING) - I reviewed patient records, labs,  notes, testing and imaging myself where available.  Lab Results  Component Value Date   WBC 6.5 05/22/2013   HGB 15.0 05/22/2013   HCT 45.6 05/22/2013   MCV 90.6 05/22/2013   PLT 275.0 05/22/2013      Component Value Date/Time   NA 139 05/22/2013 1119   K 4.9 05/22/2013 1119   CL 103 05/22/2013 1119   CO2 29 05/22/2013 1119   GLUCOSE 87 05/22/2013 1119   GLUCOSE 101 (H) 02/07/2006 0821   BUN 14 05/22/2013 1119   CREATININE 0.7 05/22/2013 1119   CALCIUM 9.5 05/22/2013 1119   PROT 7.0 05/22/2013 1119   ALBUMIN 4.3 05/22/2013 1119   AST 27 05/22/2013 1119   ALT 29 05/22/2013 1119   ALKPHOS 62 05/22/2013 1119   BILITOT 0.9 05/22/2013 1119   GFRNONAA >60 05/19/2010 0432   GFRAA  05/19/2010 0432    >60        The eGFR has been calculated using the MDRD equation. This calculation has not been validated in all clinical situations. eGFR's persistently <60 mL/min signify possible Chronic Kidney Disease.   Lab Results  Component Value Date   CHOL 204 (H) 05/22/2013   HDL 74.10 05/22/2013   LDLCALC 114 (H) 05/11/2012   LDLDIRECT 113.8 05/22/2013   TRIG 77.0 05/22/2013   CHOLHDL 3 05/22/2013   Lab Results  Component Value Date   HGBA1C 5.9 10/09/2006   No results found for: VITAMINB12 Lab Results  Component Value Date   TSH 1.76 05/22/2013      ASSESSMENT AND PLAN 86 y.o. year old male  has a past medical history of Allergy, Arthritis, Cancer ([place]), Cataract, Colon polyp, GERD (gastroesophageal reflux disease), History of colon polyps, History of prostate cancer, History of skin cancer, HTN (hypertension), Other and unspecified hyperlipidemia, PAC (premature atrial contraction), and Sleep apnea. here  with:  1. OSA on CPAP  - CPAP compliance excellent - Good treatment of AHI  - Encourage patient to use CPAP nightly and > 4 hours each night  -We will fit him today for a [employer] full facemask.  He will be given a sample to see if he prefers his mask over the nasal pillows. - F/U in 1 year or sooner if needed   I spent 30 minutes of face-to-face and non-face-to-face time with patient.  This included previsit chart review, study review, order entry, electronic health record documentation, patient education, mask refitting.  Ward Givens, MSN, NP-C 05/20/2020, 9:15 AM [facility] [address], [place], [place] [address] [phone]  I reviewed the above note and documentation by the Nurse Practitioner and agree with the history, exam, assessment and plan as outlined above. I was available for consultation. Star Age, MD, PhD [facility])

## 2020-05-20 NOTE — Patient Instructions (Signed)

## 2020-05-22 DIAGNOSIS — M25562 Pain in left knee: Secondary | ICD-10-CM | POA: Diagnosis not present

## 2020-05-27 DIAGNOSIS — B351 Tinea unguium: Secondary | ICD-10-CM | POA: Diagnosis not present

## 2020-05-27 DIAGNOSIS — I739 Peripheral vascular disease, unspecified: Secondary | ICD-10-CM | POA: Diagnosis not present

## 2020-05-31 DIAGNOSIS — G4733 Obstructive sleep apnea (adult) (pediatric): Secondary | ICD-10-CM | POA: Diagnosis not present

## 2020-06-04 ENCOUNTER — Encounter: Payer: Self-pay | Admitting: Student

## 2020-06-18 DIAGNOSIS — M25462 Effusion, left knee: Secondary | ICD-10-CM | POA: Diagnosis not present

## 2020-06-29 DIAGNOSIS — L814 Other melanin hyperpigmentation: Secondary | ICD-10-CM | POA: Diagnosis not present

## 2020-06-29 DIAGNOSIS — G4733 Obstructive sleep apnea (adult) (pediatric): Secondary | ICD-10-CM | POA: Diagnosis not present

## 2020-06-29 DIAGNOSIS — L821 Other seborrheic keratosis: Secondary | ICD-10-CM | POA: Diagnosis not present

## 2020-06-29 DIAGNOSIS — D225 Melanocytic nevi of trunk: Secondary | ICD-10-CM | POA: Diagnosis not present

## 2020-06-29 DIAGNOSIS — L570 Actinic keratosis: Secondary | ICD-10-CM | POA: Diagnosis not present

## 2020-07-10 ENCOUNTER — Other Ambulatory Visit (HOSPITAL_COMMUNITY): Payer: Self-pay

## 2020-07-13 DIAGNOSIS — B372 Candidiasis of skin and nail: Secondary | ICD-10-CM | POA: Diagnosis not present

## 2020-07-13 DIAGNOSIS — L239 Allergic contact dermatitis, unspecified cause: Secondary | ICD-10-CM | POA: Diagnosis not present

## 2020-07-13 DIAGNOSIS — L0889 Other specified local infections of the skin and subcutaneous tissue: Secondary | ICD-10-CM | POA: Diagnosis not present

## 2020-07-21 NOTE — Patient Instructions (Addendum)
DUE TO COVID-19 ONLY ONE VISITOR IS ALLOWED TO COME WITH YOU AND STAY IN THE WAITING ROOM ONLY DURING PRE OP AND PROCEDURE DAY OF SURGERY. THE 2 VISITORS  MAY VISIT WITH YOU AFTER SURGERY IN YOUR PRIVATE ROOM DURING VISITING HOURS ONLY!  YOU NEED TO HAVE A COVID 19 TEST ON_4/25______ @_2 :35 pm______, THIS TEST MUST BE DONE BEFORE SURGERY,  COVID TESTING SITE [place] [address], IT IS ON THE RIGHT GOING OUT [address] APPROXIMATELY  2 MINUTES PAST [employer] ON THE RIGHT. ONCE YOUR COVID TEST IS COMPLETED,  PLEASE BEGIN THE QUARANTINE INSTRUCTIONS AS OUTLINED IN YOUR HANDOUT.                [name]   Your procedure is scheduled on: 07/29/20   Report to [facility] Main  Entrance   Report to admitting at 10:15 AM     Call this number if you have problems the morning of surgery [place], NO CHEWING GUM [place].  No food after midnight.    You may have clear liquid until 9:30 AM.    At 9:00 AM drink pre surgery drink.   Nothing by mouth after 9:30 AM.    Take these medicines the morning of surgery with A SIP OF WATER: Metoprolol, Amlodipine             Bring your mask and tubing to the hospital with you.                               You may not have any metal on your body including              piercings  Do not wear jewelry, lotions, powders or deodorant              Men may shave face and neck.   Do not bring valuables to the hospital. [place].  Contacts, dentures or bridgework may not be worn into surgery.                  [facility] - Preparing for Surgery Before surgery, you can play an important role.  Because skin is not sterile, your skin needs to be as free of germs as possible.  You can reduce the number of germs on your skin by washing with CHG (chlorahexidine gluconate) soap before  surgery.  CHG is an antiseptic cleaner which kills germs and bonds with the skin to continue killing germs even after washing. Please DO NOT use if you have an allergy to CHG or antibacterial soaps.  If your skin becomes reddened/irritated stop using the CHG and inform your nurse when you arrive at Short Stay. .  You may shave your face/neck.  Please follow these instructions carefully:  1.  Shower with CHG Soap the night before surgery and the  morning of Surgery.  2.  If you choose to wash your hair, wash your hair first as usual with your  normal  shampoo.  3.  After you shampoo, rinse your hair and body thoroughly to remove the  shampoo.  4.  Use CHG as you would any other liquid soap.  You can apply chg directly  to the skin and wash                       Gently with a scrungie or clean washcloth.  5.  Apply the CHG Soap to your body ONLY FROM THE NECK DOWN.   Do not use on face/ open                           Wound or open sores. Avoid contact with eyes, ears mouth and genitals (private parts).                       Wash face,  Genitals (private parts) with your normal soap.             6.  Wash thoroughly, paying special attention to the area where your surgery  will be performed.  7.  Thoroughly rinse your body with warm water from the neck down.  8.  DO NOT shower/wash with your normal soap after using and rinsing off  the CHG Soap.             9.  Pat yourself dry with a clean towel.            10.  Wear clean pajamas.            11.  Place clean sheets on your bed the night of your first shower and do not  sleep with pets. Day of Surgery : Do not apply any lotions/deodorants the morning of surgery.  Please wear clean clothes to the hospital/surgery center.  FAILURE TO FOLLOW THESE INSTRUCTIONS MAY RESULT IN THE CANCELLATION OF YOUR SURGERY PATIENT SIGNATURE_________________________________  NURSE  SIGNATURE__________________________________  ________________________________________________________________________   [name]  An incentive spirometer is a tool that can help keep your lungs clear and active. This tool measures how well you are filling your lungs with each breath. Taking long deep breaths may help reverse or decrease the chance of developing breathing (pulmonary) problems (especially infection) following:  A long period of time when you are unable to move or be active. BEFORE THE PROCEDURE   If the spirometer includes an indicator to show your best effort, your nurse or respiratory therapist will set it to a desired goal.  If possible, sit up straight or lean slightly forward. Try not to slouch.  Hold the incentive spirometer in an upright position. INSTRUCTIONS FOR USE  1. Sit on the edge of your bed if possible, or sit up as far as you can in bed or on a chair. 2. Hold the incentive spirometer in an upright position. 3. Breathe out normally. 4. Place the mouthpiece in your mouth and seal your lips tightly around it. 5. Breathe in slowly and as deeply as possible, raising the piston or the ball toward the top of the column. 6. Hold your breath for 3-5 seconds or for as long as possible. Allow the piston or ball to fall to the bottom of the column. 7. Remove the mouthpiece from your mouth and breathe out normally. 8. Rest for a few seconds and repeat Steps 1 through 7 at least 10 times every 1-2 hours when you are awake. Take your time and take a few normal breaths between deep breaths. 9. The spirometer may include an indicator to show your best effort.  Use the indicator as a goal to work toward during each repetition. 10. After each set of 10 deep breaths, practice coughing to be sure your lungs are clear. If you have an incision (the cut made at the time of surgery), support your incision when coughing by placing a pillow or rolled up towels firmly  against it. Once you are able to get out of bed, walk around indoors and cough well. You may stop using the incentive spirometer when instructed by your caregiver.  RISKS AND COMPLICATIONS  Take your time so you do not get dizzy or light-headed.  If you are in pain, you may need to take or ask for pain medication before doing incentive spirometry. It is harder to take a deep breath if you are having pain. AFTER USE  Rest and breathe slowly and easily.  It can be helpful to keep track of a log of your progress. Your caregiver can provide you with a simple table to help with this. If you are using the spirometer at home, follow these instructions: [place] IF:   You are having difficultly using the spirometer.  You have trouble using the spirometer as often as instructed.  Your pain medication is not giving enough relief while using the spirometer.  You develop fever of 100.5 F (38.1 C) or higher. SEEK IMMEDIATE MEDICAL CARE IF:   You cough up bloody sputum that had not been present before.  You develop fever of 102 F (38.9 C) or greater.  You develop worsening pain at or near the incision site. MAKE SURE YOU:   Understand these instructions.  Will watch your condition.  Will get help right away if you are not doing well or get worse. Document Released: 08/01/2006 Document Revised: 06/13/2011 Document Reviewed: 10/02/2006 [facility] Patient Information 2014 [place], [place].   ________________________________________________________________________

## 2020-07-22 ENCOUNTER — Encounter (HOSPITAL_COMMUNITY)
Admission: RE | Admit: 2020-07-22 | Discharge: 2020-07-22 | Disposition: A | Discharge status: Home / Self Care | Payer: Medicare HMO | Source: Ambulatory Visit | Attending: Orthopedic Surgery | Admitting: Orthopedic Surgery

## 2020-07-22 ENCOUNTER — Encounter (HOSPITAL_COMMUNITY): Payer: Self-pay

## 2020-07-22 ENCOUNTER — Other Ambulatory Visit: Payer: Self-pay

## 2020-07-22 DIAGNOSIS — Z01812 Encounter for preprocedural laboratory examination: Secondary | ICD-10-CM | POA: Insufficient documentation

## 2020-07-22 HISTORY — DX: Cardiac arrhythmia, unspecified: I49.9

## 2020-07-22 LAB — COMPREHENSIVE METABOLIC PANEL
ALT: 38 U/L (ref 0–44)
AST: 35 U/L (ref 15–41)
Albumin: 4.3 g/dL (ref 3.5–5.0)
Alkaline Phosphatase: 80 U/L (ref 38–126)
Anion gap: 7 (ref 5–15)
BUN: 15 mg/dL (ref 8–23)
CO2: 27 mmol/L (ref 22–32)
Calcium: 9.4 mg/dL (ref 8.9–10.3)
Chloride: 107 mmol/L (ref 98–111)
Creatinine, Ser: 0.58 mg/dL — ABNORMAL LOW (ref 0.61–1.24)
GFR, Estimated: >60 mL/min (ref >60)
Glucose, Bld: 104 mg/dL — ABNORMAL HIGH (ref 70–99)
Potassium: 4.4 mmol/L (ref 3.5–5.1)
Sodium: 141 mmol/L (ref 135–145)
Total Bilirubin: 0.8 mg/dL (ref 0.3–1.2)
Total Protein: 7 g/dL (ref 6.5–8.1)

## 2020-07-22 LAB — PROTIME-INR
INR: 1 (ref 0.8–1.2)
Prothrombin Time: 13 seconds (ref 11.4–15.2)

## 2020-07-22 LAB — SURGICAL PCR SCREEN
MRSA, PCR: NEGATIVE
Staphylococcus aureus: NEGATIVE

## 2020-07-22 LAB — CBC
HCT: 44.1 % (ref 39.0–52.0)
Hemoglobin: 14.5 g/dL (ref 13.0–17.0)
MCH: 30.3 pg (ref 26.0–34.0)
MCHC: 32.9 g/dL (ref 30.0–36.0)
MCV: 92.3 fL (ref 80.0–100.0)
Platelets: 233 10*3/uL (ref 150–400)
RBC: 4.78 MIL/uL (ref 4.22–5.81)
RDW: 13.2 % (ref 11.5–15.5)
WBC: 6.4 10*3/uL (ref 4.0–10.5)
nRBC: 0 % (ref 0.0–0.2)

## 2020-07-22 LAB — APTT: aPTT: 28 seconds (ref 24–36)

## 2020-07-22 NOTE — Progress Notes (Signed)
COVID Vaccine Completed:yes Date COVID Vaccine completed:2 shots and Booster 01/2020 COVID vaccine manufacturer: Pfizer     PCP - Dr. Alfonso Patten. [facility] Cardiologist - none  Chest x-ray - no EKG - 04/27/20-chart Stress Test - no ECHO - no Cardiac Cath - no Pacemaker/ICD device last checked:NA  Sleep Study - yes CPAP - yes  Fasting Blood Sugar - NA Checks Blood Sugar _____ times a day  Blood Thinner Instructions:NA Aspirin Instructions: Last Dose:  Anesthesia review:   Patient denies shortness of breath, fever, cough and chest pain at PAT appointment yes  Patient verbalized understanding of instructions that were given to them at the PAT appointment. Patient was also instructed that they will need to review over the PAT instructions again at home before surgery.yes Pt has no SOB with any activities.

## 2020-07-22 NOTE — H&P (Signed)
ADMISSION H&P  Patient is being admitted for left total knee arthroplasty polyethylene revision.  Subjective:  Chief Complaint: Status post left total knee with instability and recurrent effusions presents for pre-operative visit in preparation for their left knee poly revision, which is scheduled on 07/29/2020 with Dr. Wynelle Link at [facility]. The patient has had symptoms in the left knee including pain and swelling which has impacted their quality of life and ability to do activities of daily living. The patient currently has a diagnosis of unstable left total knee arthroplasty and has failed conservative treatments including activity modification and physical therapy. The patient has had previous total knee arthroplasty on the left knee. The patient denies an active infection. Bone scan was negative for loosening.   HPI: [name], 86 y.o. male   Patient Active Problem List   Diagnosis Date Noted  . COLONIC POLYPS, HX OF 04/30/2010  . URINARY INCONTINENCE 04/28/2008  . SKIN CANCER, HX OF 04/28/2008  . HYPERLIPIDEMIA 04/26/2007  . HYPERTENSION, ESSENTIAL NOS 04/26/2007  . PROSTATE CANCER, HX OF 04/26/2007  . G E R D 08/25/2006  . ARTHRITIS 08/09/2006    Past Medical History:  Diagnosis Date  . Allergy   . Arthritis   . Cancer [place])    prostate CA 1997, skin CA  . Cataract   . Colon polyp   . Dysrhythmia    PACs  . GERD (gastroesophageal reflux disease)   . History of colon polyps    adenoma;[facility]  . History of prostate cancer   . History of skin cancer   . HTN (hypertension)   . Other and unspecified hyperlipidemia   . PAC (premature atrial contraction)   . Sleep apnea    uses CPAP    Past Surgical History:  Procedure Laterality Date  . COLONOSCOPY    . COLONOSCOPY W/ POLYPECTOMY  last 2013    X 2; [facility]  . EYE SURGERY Bilateral 2021  . HEMORROIDECTOMY    . INSERTION OF MESH N/A 05/04/2020   Procedure: INSERTION OF MESH;  Surgeon: Coralie Keens, MD;  Location: [place];  Service: General;  Laterality: N/A;  . PROSTATE SURGERY    . PROSTATECTOMY  1997   Dr Rosana Hoes  . ROTATOR CUFF REPAIR      X 3(L twice); Dr Theda Sers  . TOTAL KNEE ARTHROPLASTY     L; Dr Maureen Ralphs  . UMBILICAL HERNIA REPAIR N/A 05/04/2020   Procedure: UMBILICAL HERNIA REPAIR WITH MESH;  Surgeon: Coralie Keens, MD;  Location: [place];  Service: General;  Laterality: N/A;  LMA    Prior to Admission medications   Medication Sig Start Date End Date Taking? Authorizing Provider  amLODipine (NORVASC) 10 MG tablet Take 1 tablet (10 mg total) by mouth daily. Patient taking differently: Take 10 mg by mouth at bedtime. 05/22/13  Yes Hendricks Limes, MD  atorvastatin (LIPITOR) 20 MG tablet TAKE ONE AND ONE-HALF TABLETS DAILY Patient taking differently: Take 30 mg by mouth every morning. TAKE ONE AND ONE-HALF TABLETS DAILY 05/22/13  Yes Hendricks Limes, MD  benazepril (LOTENSIN) 40 MG tablet Take 1 tablet (40 mg total) by mouth daily. Patient taking differently: Take 40 mg by mouth at bedtime. 05/22/13  Yes Hendricks Limes, MD  cetirizine (ZYRTEC) 10 MG tablet Take 10 mg by mouth daily.   Yes [provider]  fluticasone (FLONASE) 50 MCG/ACT nasal spray Place 1 spray into both nostrils daily as needed for  allergies or rhinitis.   Yes [provider]  meloxicam (MOBIC) 15 MG tablet Take 15 mg by mouth daily as needed for pain. 02/05/20  Yes [provider]  metoprolol (LOPRESSOR) 50 MG tablet 1/2 by mouth two times daily Patient taking differently: Take 25 mg by mouth 2 (two) times daily. 05/22/13  Yes Hendricks Limes, MD  Glucosamine 750 MG TABS Take 750 mg by mouth daily. Patient not taking: Reported on 07/09/2020    [provider]  Melatonin 10 MG TABS Take 10 mg by mouth at bedtime as needed (sleep). Patient not taking: Reported on 07/09/2020    [provider]  Multiple Vitamin  (MULTIVITAMIN) tablet Take 1 tablet by mouth daily. Patient not taking: Reported on 07/09/2020    [provider]    Allergies  Allergen Reactions  . Cefdinir Nausea Only    Per PCP, causes upset stomach    Social History   Socioeconomic History  . Marital status: Married    Spouse name: Not on file  . Number of children: 3  . Years of education: BA  . Highest education level: Not on file  Occupational History  . Occupation: Retired  Tobacco Use  . Smoking status: Never Smoker  . Smokeless tobacco: Never Used  Vaping Use  . Vaping Use: Never used  Substance and Sexual Activity  . Alcohol use: Yes    Alcohol/week: 14.0 standard drinks    Types: 14 Shots of liquor per week    Comment:  14 drinks /week  . Drug use: No  . Sexual activity: Not on file  Other Topics Concern  . Not on file  Social History Narrative   Drinks 2 cups of coffee a day    Social Determinants of Health   Financial Resource Strain: Not on file  Food Insecurity: Not on file  Transportation Needs: Not on file  Physical Activity: Not on file  Stress: Not on file  Social Connections: Not on file  Intimate Partner Violence: Not on file    Tobacco Use: Low Risk   . Smoking Tobacco Use: Never Smoker  . Smokeless Tobacco Use: Never Used   Social History   Substance and Sexual Activity  Alcohol Use Yes  . Alcohol/week: 14.0 standard drinks  . Types: 14 Shots of liquor per week   Comment:  14 drinks /week    Family History  Problem Relation Age of Onset  . Stroke Mother 73  . Heart disease Father 12       ?MI  . Heart disease Brother        CBAG in 11s  . Dementia Brother        vascular dementia  . Colon cancer Neg Hx   . Stomach cancer Neg Hx   . Diabetes Neg Hx   . Cancer Neg Hx   . Esophageal cancer Neg Hx   . Rectal cancer Neg Hx     Review of Systems  Constitutional: Negative for chills and fever.  HENT: Negative for congestion, sore throat and tinnitus.   Eyes:  Negative for double vision, photophobia and pain.  Respiratory: Negative for cough, shortness of breath and wheezing.   Cardiovascular: Negative for chest pain, palpitations and orthopnea.  Gastrointestinal: Negative for heartburn, nausea and vomiting.  Genitourinary: Negative for dysuria, frequency and urgency.  Musculoskeletal: Positive for joint pain.  Neurological: Negative for dizziness, weakness and headaches.      Objective:  Physical Exam: Well nourished and well  developed.  General: Alert and oriented x3, cooperative and pleasant, no acute distress.  Head: normocephalic, atraumatic, neck supple.  Eyes: EOMI.  Respiratory: breath sounds clear in all fields, no wheezing, rales, or rhonchi. Cardiovascular: Regular rate and rhythm, no murmurs, gallops or rubs.  Abdomen: non-tender to palpation and soft, normoactive bowel sounds. Musculoskeletal:  Left knee exam:  Trace effusion present. No warmth present.  Range of motion: 0 to 130 degrees.  Some varus/valgus play in extension and AP play in flexion.   Calves soft and nontender. Motor function intact in LE. Strength 5/5 LE bilaterally. Neuro: Distal pulses 2+. Sensation to light touch intact in LE.  Vital signs in last 24 hours: Temp:  [98 F (36.7 C)] 98 F (36.7 C) (04/20 0900) Pulse Rate:  [66] 66 (04/20 0900) Resp:  [20] 20 (04/20 0900) BP: (94)/(61) 94/61 (04/20 0900) SpO2:  [98 %] 98 % (04/20 0900) Weight:  [86.2 kg] 86.2 kg (04/20 0900)  Imaging Review Plain radiographs demonstrate the left knee prosthesis in good position with no evidence of loosening.   Assessment/Plan:  Unstable left total knee arthropalsty  The risks due to aseptic loosening, infection, stiffness, patella tracking problems, thromboembolic complications and other imponderables were discussed. The patient acknowledged the explanation, agreed to proceed with the plan and consent was signed. Patient is being admitted for inpatient treatment  for surgery, pain control, PT, OT, prophylactic antibiotics, VTE prophylaxis, progressive ambulation and ADLs and discharge planning. The patient is planning to be discharged home.  Therapy Plans: Outpatient therapy at [facility] Disposition: Home with wife Planned DVT Prophylaxis: Xarelto 10 mg QD DME Needed: None PCP: Ravisankar Avva, MD  TXA: IV Allergies: Neosporin Anesthesia Concerns: None BMI: 27.7 Last HgbA1c: Not diabetic Pharmacy: [facility])  Other:  - Hx prostate CA - Taking clearance form by Dr. Danna Hefty office  - Patient was instructed on what medications to stop prior to surgery. - Follow-up visit in 2 weeks with Dr. Wynelle Link - Begin physical therapy following surgery - Pre-operative lab work as pre-surgical testing - Prescriptions will be provided in hospital at time of discharge  Theresa Duty, PA-C Orthopedic Surgery [facility] [place]

## 2020-07-23 DIAGNOSIS — G4733 Obstructive sleep apnea (adult) (pediatric): Secondary | ICD-10-CM | POA: Diagnosis not present

## 2020-07-23 DIAGNOSIS — K5730 Diverticulosis of large intestine without perforation or abscess without bleeding: Secondary | ICD-10-CM | POA: Diagnosis not present

## 2020-07-23 DIAGNOSIS — I499 Cardiac arrhythmia, unspecified: Secondary | ICD-10-CM | POA: Diagnosis not present

## 2020-07-23 DIAGNOSIS — J309 Allergic rhinitis, unspecified: Secondary | ICD-10-CM | POA: Diagnosis not present

## 2020-07-23 DIAGNOSIS — M1990 Unspecified osteoarthritis, unspecified site: Secondary | ICD-10-CM | POA: Diagnosis not present

## 2020-07-23 DIAGNOSIS — E785 Hyperlipidemia, unspecified: Secondary | ICD-10-CM | POA: Diagnosis not present

## 2020-07-23 DIAGNOSIS — M129 Arthropathy, unspecified: Secondary | ICD-10-CM | POA: Diagnosis not present

## 2020-07-23 DIAGNOSIS — I10 Essential (primary) hypertension: Secondary | ICD-10-CM | POA: Diagnosis not present

## 2020-07-23 DIAGNOSIS — G4700 Insomnia, unspecified: Secondary | ICD-10-CM | POA: Diagnosis not present

## 2020-07-23 DIAGNOSIS — Z8546 Personal history of malignant neoplasm of prostate: Secondary | ICD-10-CM | POA: Diagnosis not present

## 2020-07-27 ENCOUNTER — Other Ambulatory Visit (HOSPITAL_COMMUNITY)
Admission: RE | Admit: 2020-07-27 | Discharge: 2020-07-27 | Disposition: A | Discharge status: Home / Self Care | Payer: Medicare HMO | Source: Ambulatory Visit | Attending: Orthopedic Surgery | Admitting: Orthopedic Surgery

## 2020-07-27 DIAGNOSIS — Z20822 Contact with and (suspected) exposure to covid-19: Secondary | ICD-10-CM | POA: Insufficient documentation

## 2020-07-27 DIAGNOSIS — Z01812 Encounter for preprocedural laboratory examination: Secondary | ICD-10-CM | POA: Insufficient documentation

## 2020-07-28 LAB — SARS CORONAVIRUS 2 (TAT 6-24 HRS): SARS Coronavirus 2: NEGATIVE

## 2020-07-28 MED ORDER — BUPIVACAINE LIPOSOME 1.3 % IJ SUSP
20.0000 mL | Freq: Once | INTRAMUSCULAR | Status: DC
  Filled 2020-07-28: qty 20

## 2020-07-29 ENCOUNTER — Encounter (HOSPITAL_COMMUNITY)
Admission: RE | Disposition: A | Discharge status: Home / Self Care | Payer: Self-pay | Source: Home / Self Care | Attending: Orthopedic Surgery

## 2020-07-29 ENCOUNTER — Inpatient Hospital Stay (HOSPITAL_COMMUNITY): Payer: Medicare HMO | Admitting: Registered Nurse

## 2020-07-29 ENCOUNTER — Inpatient Hospital Stay (HOSPITAL_COMMUNITY)
Admission: RE | Admit: 2020-07-29 | Discharge: 2020-07-31 | DRG: 489 | Disposition: A | Discharge status: Home / Self Care | Payer: Medicare HMO | Attending: Orthopedic Surgery | Admitting: Orthopedic Surgery

## 2020-07-29 ENCOUNTER — Encounter (HOSPITAL_COMMUNITY): Payer: Self-pay | Admitting: Orthopedic Surgery

## 2020-07-29 ENCOUNTER — Other Ambulatory Visit: Payer: Self-pay

## 2020-07-29 DIAGNOSIS — M1990 Unspecified osteoarthritis, unspecified site: Secondary | ICD-10-CM | POA: Diagnosis present

## 2020-07-29 DIAGNOSIS — K219 Gastro-esophageal reflux disease without esophagitis: Secondary | ICD-10-CM | POA: Diagnosis present

## 2020-07-29 DIAGNOSIS — Z8249 Family history of ischemic heart disease and other diseases of the circulatory system: Secondary | ICD-10-CM | POA: Diagnosis not present

## 2020-07-29 DIAGNOSIS — I10 Essential (primary) hypertension: Secondary | ICD-10-CM | POA: Diagnosis present

## 2020-07-29 DIAGNOSIS — C61 Malignant neoplasm of prostate: Secondary | ICD-10-CM | POA: Diagnosis not present

## 2020-07-29 DIAGNOSIS — Y792 Prosthetic and other implants, materials and accessory orthopedic devices associated with adverse incidents: Secondary | ICD-10-CM | POA: Diagnosis present

## 2020-07-29 DIAGNOSIS — Z8546 Personal history of malignant neoplasm of prostate: Secondary | ICD-10-CM

## 2020-07-29 DIAGNOSIS — M21169 Varus deformity, not elsewhere classified, unspecified knee: Secondary | ICD-10-CM | POA: Diagnosis present

## 2020-07-29 DIAGNOSIS — Z881 Allergy status to other antibiotic agents status: Secondary | ICD-10-CM

## 2020-07-29 DIAGNOSIS — M25362 Other instability, left knee: Secondary | ICD-10-CM | POA: Diagnosis present

## 2020-07-29 DIAGNOSIS — G4730 Sleep apnea, unspecified: Secondary | ICD-10-CM | POA: Diagnosis present

## 2020-07-29 DIAGNOSIS — M21069 Valgus deformity, not elsewhere classified, unspecified knee: Secondary | ICD-10-CM | POA: Diagnosis present

## 2020-07-29 DIAGNOSIS — Z96652 Presence of left artificial knee joint: Secondary | ICD-10-CM | POA: Diagnosis not present

## 2020-07-29 DIAGNOSIS — T84093A Other mechanical complication of internal left knee prosthesis, initial encounter: Secondary | ICD-10-CM

## 2020-07-29 DIAGNOSIS — T84023A Instability of internal left knee prosthesis, initial encounter: Secondary | ICD-10-CM | POA: Diagnosis not present

## 2020-07-29 DIAGNOSIS — Z20822 Contact with and (suspected) exposure to covid-19: Secondary | ICD-10-CM | POA: Diagnosis present

## 2020-07-29 DIAGNOSIS — G8918 Other acute postprocedural pain: Secondary | ICD-10-CM | POA: Diagnosis not present

## 2020-07-29 DIAGNOSIS — Z79890 Hormone replacement therapy (postmenopausal): Secondary | ICD-10-CM

## 2020-07-29 DIAGNOSIS — Z85828 Personal history of other malignant neoplasm of skin: Secondary | ICD-10-CM

## 2020-07-29 DIAGNOSIS — Z79899 Other long term (current) drug therapy: Secondary | ICD-10-CM

## 2020-07-29 DIAGNOSIS — Z96659 Presence of unspecified artificial knee joint: Secondary | ICD-10-CM

## 2020-07-29 DIAGNOSIS — Z9079 Acquired absence of other genital organ(s): Secondary | ICD-10-CM | POA: Diagnosis not present

## 2020-07-29 DIAGNOSIS — Z791 Long term (current) use of non-steroidal anti-inflammatories (NSAID): Secondary | ICD-10-CM

## 2020-07-29 DIAGNOSIS — T84018A Broken internal joint prosthesis, other site, initial encounter: Secondary | ICD-10-CM

## 2020-07-29 DIAGNOSIS — E785 Hyperlipidemia, unspecified: Secondary | ICD-10-CM | POA: Diagnosis not present

## 2020-07-29 HISTORY — PX: TOTAL KNEE REVISION: SHX996

## 2020-07-29 LAB — TYPE AND SCREEN
ABO/RH(D): O POS
Antibody Screen: NEGATIVE

## 2020-07-29 SURGERY — TOTAL KNEE REVISION
Anesthesia: General | Site: Knee | Laterality: Left

## 2020-07-29 MED ORDER — METHOCARBAMOL 500 MG PO TABS
500.0000 mg | ORAL_TABLET | Freq: Four times a day (QID) | ORAL | Status: DC | PRN
  Administered 2020-07-29 – 2020-07-31 (×4): 500 mg via ORAL
  Filled 2020-07-29 (×4): qty 1

## 2020-07-29 MED ORDER — ROCURONIUM BROMIDE 10 MG/ML (PF) SYRINGE
PREFILLED_SYRINGE | INTRAVENOUS | Status: AC
  Filled 2020-07-29: qty 10

## 2020-07-29 MED ORDER — HYDROMORPHONE HCL 1 MG/ML IJ SOLN
INTRAMUSCULAR | Status: AC
  Filled 2020-07-29: qty 1

## 2020-07-29 MED ORDER — FLEET ENEMA 7-19 GM/118ML RE ENEM: 1.0000 | ENEMA | Freq: Once | RECTAL | Status: DC | PRN

## 2020-07-29 MED ORDER — DEXAMETHASONE SODIUM PHOSPHATE 10 MG/ML IJ SOLN
10.0000 mg | Freq: Once | INTRAMUSCULAR | Status: AC
  Administered 2020-07-30: 10 mg via INTRAVENOUS
  Filled 2020-07-29: qty 1

## 2020-07-29 MED ORDER — LIDOCAINE 2% (20 MG/ML) 5 ML SYRINGE
INTRAMUSCULAR | Status: AC
  Filled 2020-07-29: qty 5

## 2020-07-29 MED ORDER — METOCLOPRAMIDE HCL 5 MG PO TABS: 5.0000 mg | ORAL_TABLET | Freq: Three times a day (TID) | ORAL | Status: DC | PRN

## 2020-07-29 MED ORDER — ONDANSETRON HCL 4 MG/2ML IJ SOLN
INTRAMUSCULAR | Status: AC
  Filled 2020-07-29: qty 2

## 2020-07-29 MED ORDER — METHOCARBAMOL 1000 MG/10ML IJ SOLN
500.0000 mg | Freq: Four times a day (QID) | INTRAVENOUS | Status: DC | PRN
  Filled 2020-07-29: qty 5

## 2020-07-29 MED ORDER — DIPHENHYDRAMINE HCL 12.5 MG/5ML PO ELIX
12.5000 mg | ORAL_SOLUTION | ORAL | Status: DC | PRN
Start: 2020-07-29 — End: 2020-07-31

## 2020-07-29 MED ORDER — AMLODIPINE BESYLATE 10 MG PO TABS
10.0000 mg | ORAL_TABLET | Freq: Every day | ORAL | Status: DC
  Administered 2020-07-30: 10 mg via ORAL
  Filled 2020-07-29: qty 1

## 2020-07-29 MED ORDER — TRANEXAMIC ACID-NACL 1000-0.7 MG/100ML-% IV SOLN
1000.0000 mg | INTRAVENOUS | Status: AC
  Administered 2020-07-29: 1000 mg via INTRAVENOUS
  Filled 2020-07-29: qty 100

## 2020-07-29 MED ORDER — MORPHINE SULFATE (PF) 2 MG/ML IV SOLN: 0.5000 mg | INTRAVENOUS | Status: DC | PRN

## 2020-07-29 MED ORDER — METOCLOPRAMIDE HCL 5 MG/ML IJ SOLN: 5.0000 mg | Freq: Three times a day (TID) | INTRAMUSCULAR | Status: DC | PRN

## 2020-07-29 MED ORDER — ROPIVACAINE HCL 7.5 MG/ML IJ SOLN
INTRAMUSCULAR | Status: DC | PRN
  Administered 2020-07-29: 20 mL via PERINEURAL

## 2020-07-29 MED ORDER — BUPIVACAINE LIPOSOME 1.3 % IJ SUSP
INTRAMUSCULAR | Status: DC | PRN
  Administered 2020-07-29: 20 mL

## 2020-07-29 MED ORDER — PROPOFOL 10 MG/ML IV BOLUS
INTRAVENOUS | Status: DC | PRN
  Administered 2020-07-29: 150 mg via INTRAVENOUS

## 2020-07-29 MED ORDER — PHENOL 1.4 % MT LIQD: 1.0000 | OROMUCOSAL | Status: DC | PRN

## 2020-07-29 MED ORDER — ORAL CARE MOUTH RINSE: 15.0000 mL | Freq: Once | OROMUCOSAL | Status: AC

## 2020-07-29 MED ORDER — CEFAZOLIN SODIUM-DEXTROSE 2-4 GM/100ML-% IV SOLN
2.0000 g | Freq: Four times a day (QID) | INTRAVENOUS | Status: AC
  Administered 2020-07-29 – 2020-07-30 (×2): 2 g via INTRAVENOUS
  Filled 2020-07-29 (×2): qty 100

## 2020-07-29 MED ORDER — CLONIDINE HCL (ANALGESIA) 100 MCG/ML EP SOLN
EPIDURAL | Status: DC | PRN
  Administered 2020-07-29: 80 ug

## 2020-07-29 MED ORDER — POVIDONE-IODINE 10 % EX SWAB
2.0000 "application " | Freq: Once | CUTANEOUS | Status: AC
  Administered 2020-07-29: 2 via TOPICAL

## 2020-07-29 MED ORDER — CHLORHEXIDINE GLUCONATE 0.12 % MT SOLN
15.0000 mL | Freq: Once | OROMUCOSAL | Status: AC
  Administered 2020-07-29: 15 mL via OROMUCOSAL

## 2020-07-29 MED ORDER — SODIUM CHLORIDE (PF) 0.9 % IJ SOLN
INTRAMUSCULAR | Status: DC | PRN
  Administered 2020-07-29: 60 mL

## 2020-07-29 MED ORDER — ACETAMINOPHEN 10 MG/ML IV SOLN
1000.0000 mg | Freq: Four times a day (QID) | INTRAVENOUS | Status: DC
  Administered 2020-07-29: 1000 mg via INTRAVENOUS
  Filled 2020-07-29: qty 100

## 2020-07-29 MED ORDER — ATORVASTATIN CALCIUM 20 MG PO TABS
30.0000 mg | ORAL_TABLET | Freq: Every morning | ORAL | Status: DC
  Administered 2020-07-30 – 2020-07-31 (×2): 30 mg via ORAL
  Filled 2020-07-29 (×2): qty 1

## 2020-07-29 MED ORDER — ONDANSETRON HCL 4 MG/2ML IJ SOLN: 4.0000 mg | Freq: Four times a day (QID) | INTRAMUSCULAR | Status: DC | PRN

## 2020-07-29 MED ORDER — SODIUM CHLORIDE 0.9 % IR SOLN
Status: DC | PRN
  Administered 2020-07-29 (×2): 1000 mL

## 2020-07-29 MED ORDER — DEXAMETHASONE SODIUM PHOSPHATE 10 MG/ML IJ SOLN
INTRAMUSCULAR | Status: AC
  Filled 2020-07-29: qty 1

## 2020-07-29 MED ORDER — DOCUSATE SODIUM 100 MG PO CAPS
100.0000 mg | ORAL_CAPSULE | Freq: Two times a day (BID) | ORAL | Status: DC
  Administered 2020-07-29 – 2020-07-31 (×4): 100 mg via ORAL
  Filled 2020-07-29 (×4): qty 1

## 2020-07-29 MED ORDER — FENTANYL CITRATE (PF) 100 MCG/2ML IJ SOLN
50.0000 ug | INTRAMUSCULAR | Status: DC
  Administered 2020-07-29: 50 ug via INTRAVENOUS
  Filled 2020-07-29: qty 2

## 2020-07-29 MED ORDER — FENTANYL CITRATE (PF) 250 MCG/5ML IJ SOLN
INTRAMUSCULAR | Status: AC
  Filled 2020-07-29: qty 5

## 2020-07-29 MED ORDER — ONDANSETRON HCL 4 MG PO TABS: 4.0000 mg | ORAL_TABLET | Freq: Four times a day (QID) | ORAL | Status: DC | PRN

## 2020-07-29 MED ORDER — SODIUM CHLORIDE 0.9 % IV SOLN: INTRAVENOUS | Status: DC

## 2020-07-29 MED ORDER — BISACODYL 10 MG RE SUPP: 10.0000 mg | Freq: Every day | RECTAL | Status: DC | PRN

## 2020-07-29 MED ORDER — FENTANYL CITRATE (PF) 100 MCG/2ML IJ SOLN
INTRAMUSCULAR | Status: DC | PRN
  Administered 2020-07-29 (×4): 50 ug via INTRAVENOUS

## 2020-07-29 MED ORDER — DEXAMETHASONE SODIUM PHOSPHATE 4 MG/ML IJ SOLN
INTRAMUSCULAR | Status: DC | PRN
  Administered 2020-07-29: 5 mg via PERINEURAL

## 2020-07-29 MED ORDER — RIVAROXABAN 10 MG PO TABS
10.0000 mg | ORAL_TABLET | Freq: Every day | ORAL | Status: DC
  Administered 2020-07-30 – 2020-07-31 (×2): 10 mg via ORAL
  Filled 2020-07-29 (×2): qty 1

## 2020-07-29 MED ORDER — TRAMADOL HCL 50 MG PO TABS
50.0000 mg | ORAL_TABLET | Freq: Four times a day (QID) | ORAL | Status: DC | PRN
  Administered 2020-07-31 (×2): 100 mg via ORAL
  Filled 2020-07-29: qty 1
  Filled 2020-07-29 (×2): qty 2

## 2020-07-29 MED ORDER — MENTHOL 3 MG MT LOZG: 1.0000 | LOZENGE | OROMUCOSAL | Status: DC | PRN

## 2020-07-29 MED ORDER — CEFAZOLIN SODIUM-DEXTROSE 2-4 GM/100ML-% IV SOLN
2.0000 g | INTRAVENOUS | Status: AC
  Administered 2020-07-29: 2 g via INTRAVENOUS
  Filled 2020-07-29: qty 100

## 2020-07-29 MED ORDER — ACETAMINOPHEN 500 MG PO TABS
1000.0000 mg | ORAL_TABLET | Freq: Four times a day (QID) | ORAL | Status: AC
  Administered 2020-07-29 – 2020-07-30 (×4): 1000 mg via ORAL
  Filled 2020-07-29 (×4): qty 2

## 2020-07-29 MED ORDER — ONDANSETRON HCL 4 MG/2ML IJ SOLN
INTRAMUSCULAR | Status: DC | PRN
  Administered 2020-07-29: 4 mg via INTRAVENOUS

## 2020-07-29 MED ORDER — DROPERIDOL 2.5 MG/ML IJ SOLN: 0.6250 mg | Freq: Once | INTRAMUSCULAR | Status: DC | PRN

## 2020-07-29 MED ORDER — LACTATED RINGERS IV SOLN: INTRAVENOUS | Status: DC

## 2020-07-29 MED ORDER — HYDROMORPHONE HCL 1 MG/ML IJ SOLN
0.2500 mg | INTRAMUSCULAR | Status: DC | PRN
  Administered 2020-07-29 (×4): 0.5 mg via INTRAVENOUS

## 2020-07-29 MED ORDER — ROCURONIUM BROMIDE 10 MG/ML (PF) SYRINGE
PREFILLED_SYRINGE | INTRAVENOUS | Status: DC | PRN
  Administered 2020-07-29: 60 mg via INTRAVENOUS

## 2020-07-29 MED ORDER — METOPROLOL TARTRATE 25 MG PO TABS
25.0000 mg | ORAL_TABLET | Freq: Two times a day (BID) | ORAL | Status: DC
  Administered 2020-07-29 – 2020-07-31 (×4): 25 mg via ORAL
  Filled 2020-07-29 (×4): qty 1

## 2020-07-29 MED ORDER — POLYETHYLENE GLYCOL 3350 17 G PO PACK: 17.0000 g | PACK | Freq: Every day | ORAL | Status: DC | PRN

## 2020-07-29 MED ORDER — OXYCODONE HCL 5 MG PO TABS
5.0000 mg | ORAL_TABLET | ORAL | Status: DC | PRN
  Administered 2020-07-29: 5 mg via ORAL
  Administered 2020-07-30: 10 mg via ORAL
  Administered 2020-07-30 (×2): 5 mg via ORAL
  Administered 2020-07-31: 10 mg via ORAL
  Filled 2020-07-29 (×2): qty 1
  Filled 2020-07-29: qty 2
  Filled 2020-07-29 (×3): qty 1

## 2020-07-29 MED ORDER — 0.9 % SODIUM CHLORIDE (POUR BTL) OPTIME
TOPICAL | Status: DC | PRN
  Administered 2020-07-29: 1000 mL

## 2020-07-29 MED ORDER — DEXAMETHASONE SODIUM PHOSPHATE 10 MG/ML IJ SOLN
8.0000 mg | Freq: Once | INTRAMUSCULAR | Status: AC
  Administered 2020-07-29: 8 mg via INTRAVENOUS

## 2020-07-29 MED ORDER — STERILE WATER FOR IRRIGATION IR SOLN: Status: DC | PRN

## 2020-07-29 SURGICAL SUPPLY — 57 items
BAG DECANTER FOR FLEXI CONT (MISCELLANEOUS) ×2 IMPLANT
BAG SPEC THK2 15X12 ZIP CLS (MISCELLANEOUS)
BAG ZIPLOCK 12X15 (MISCELLANEOUS) IMPLANT
BLADE SAG 18X100X1.27 (BLADE) ×2 IMPLANT
BLADE SAW SGTL 11.0X1.19X90.0M (BLADE) ×1 IMPLANT
BLADE SURG SZ10 CARB STEEL (BLADE) ×4 IMPLANT
BNDG ELASTIC 6X5.8 VLCR STR LF (GAUZE/BANDAGES/DRESSINGS) ×2 IMPLANT
CLOTH BEACON ORANGE TIMEOUT ST (SAFETY) ×1 IMPLANT
COVER SURGICAL LIGHT HANDLE (MISCELLANEOUS) ×2 IMPLANT
COVER WAND RF STERILE (DRAPES) ×1 IMPLANT
CUFF TOURN SGL QUICK 34 (TOURNIQUET CUFF) ×2
CUFF TRNQT CYL 34X4.125X (TOURNIQUET CUFF) ×1 IMPLANT
DECANTER SPIKE VIAL GLASS SM (MISCELLANEOUS) ×1 IMPLANT
DRAPE U-SHAPE 47X51 STRL (DRAPES) ×2 IMPLANT
DRSG ADAPTIC 3X8 NADH LF (GAUZE/BANDAGES/DRESSINGS) ×1 IMPLANT
DRSG AQUACEL AG ADV 3.5X10 (GAUZE/BANDAGES/DRESSINGS) ×1 IMPLANT
DRSG PAD ABDOMINAL 8X10 ST (GAUZE/BANDAGES/DRESSINGS) ×1 IMPLANT
DURAPREP 26ML APPLICATOR (WOUND CARE) ×2 IMPLANT
ELECT REM PT RETURN 15FT ADLT (MISCELLANEOUS) ×2 IMPLANT
EVACUATOR 1/8 PVC DRAIN (DRAIN) ×1 IMPLANT
GAUZE SPONGE 4X4 12PLY STRL (GAUZE/BANDAGES/DRESSINGS) ×1 IMPLANT
GLOVE SRG 8 PF TXTR STRL LF DI (GLOVE) ×1 IMPLANT
GLOVE SURG ENC MOIS LTX SZ6 (GLOVE) IMPLANT
GLOVE SURG ENC MOIS LTX SZ6.5 (GLOVE) ×4 IMPLANT
GLOVE SURG ENC MOIS LTX SZ8 (GLOVE) ×4 IMPLANT
GLOVE SURG UNDER POLY LF SZ7 (GLOVE) ×2 IMPLANT
GLOVE SURG UNDER POLY LF SZ8 (GLOVE) ×2
GLOVE SURG UNDER POLY LF SZ8.5 (GLOVE) IMPLANT
GOWN STRL REUS W/TWL LRG LVL3 (GOWN DISPOSABLE) ×4 IMPLANT
HANDPIECE INTERPULSE COAX TIP (DISPOSABLE) ×2
HOLDER FOLEY CATH W/STRAP (MISCELLANEOUS) IMPLANT
IMMOBILIZER KNEE 20 (SOFTGOODS) ×2
IMMOBILIZER KNEE 20 THIGH 36 (SOFTGOODS) ×1 IMPLANT
KIT TURNOVER KIT A (KITS) ×2 IMPLANT
MANIFOLD NEPTUNE II (INSTRUMENTS) ×2 IMPLANT
NS IRRIG 1000ML POUR BTL (IV SOLUTION) ×2 IMPLANT
PACK TOTAL KNEE CUSTOM (KITS) ×2 IMPLANT
PADDING CAST COTTON 6X4 STRL (CAST SUPPLIES) ×3 IMPLANT
PENCIL SMOKE EVACUATOR (MISCELLANEOUS) IMPLANT
PLATE ROT INSERT 17.5MM SIZE5 (Plate) ×1 IMPLANT
PROTECTOR NERVE ULNAR (MISCELLANEOUS) ×2 IMPLANT
SET HNDPC FAN SPRY TIP SCT (DISPOSABLE) ×1 IMPLANT
STRIP CLOSURE SKIN 1/2X4 (GAUZE/BANDAGES/DRESSINGS) ×2 IMPLANT
SUT MNCRL AB 4-0 PS2 18 (SUTURE) ×2 IMPLANT
SUT STRATAFIX 0 PDS 27 VIOLET (SUTURE) ×2
SUT VIC AB 2-0 CT1 27 (SUTURE) ×6
SUT VIC AB 2-0 CT1 TAPERPNT 27 (SUTURE) ×3 IMPLANT
SUTURE STRATFX 0 PDS 27 VIOLET (SUTURE) ×1 IMPLANT
SWAB COLLECTION DEVICE MRSA (MISCELLANEOUS) IMPLANT
SWAB CULTURE ESWAB REG 1ML (MISCELLANEOUS) IMPLANT
SYR 50ML LL SCALE MARK (SYRINGE) ×4 IMPLANT
TOWER CARTRIDGE SMART MIX (DISPOSABLE) ×1 IMPLANT
TRAY FOLEY MTR SLVR 16FR STAT (SET/KITS/TRAYS/PACK) ×1 IMPLANT
TUBE KAMVAC SUCTION (TUBING) IMPLANT
TUBE SUCTION HIGH CAP CLEAR NV (SUCTIONS) ×2 IMPLANT
WATER STERILE IRR 1000ML POUR (IV SOLUTION) ×1 IMPLANT
WRAP KNEE MAXI GEL POST OP (GAUZE/BANDAGES/DRESSINGS) ×1 IMPLANT

## 2020-07-29 NOTE — Progress Notes (Signed)
Assisted Dr. Lissa Hoard with left, ultrasound guided, adductor canal block. Side rails up, monitors on throughout procedure. See vital signs in flow sheet. Tolerated Procedure well.

## 2020-07-29 NOTE — Anesthesia Postprocedure Evaluation (Signed)
Anesthesia Post Note  Patient: [name]  Procedure(s) Performed: LEFT KNEE REVISION, POLYETHYLENE EXCHANGE (Left Knee)     Patient location during evaluation: PACU Anesthesia Type: General Level of consciousness: sedated and patient cooperative Pain management: pain level controlled Vital Signs Assessment: post-procedure vital signs reviewed and stable Respiratory status: spontaneous breathing Cardiovascular status: stable Anesthetic complications: no   No complications documented.  Last Vitals:  Vitals:   07/29/20 1503 07/29/20 1526  BP: (!) 138/100 129/76  Pulse: 77 75  Resp: 14   Temp:    SpO2: 98% 96%    Last Pain:  Vitals:   07/29/20 1526  TempSrc:   PainSc: [place]

## 2020-07-29 NOTE — Anesthesia Procedure Notes (Signed)
Procedure Name: Intubation Date/Time: 07/29/2020 12:20 PM Performed by: Deliah Boston, CRNA Pre-anesthesia Checklist: Patient identified, Emergency Drugs available, Suction available and Patient being monitored Patient Re-evaluated:Patient Re-evaluated prior to induction Oxygen Delivery Method: Circle system utilized Preoxygenation: Pre-oxygenation with 100% oxygen Induction Type: IV induction Ventilation: Mask ventilation without difficulty Laryngoscope Size: Mac and 4 Grade View: Grade II Tube type: Oral Tube size: 7.5 mm Number of attempts: 1 Airway Equipment and Method: Stylet and Oral airway Placement Confirmation: ETT inserted through vocal cords under direct vision,  positive ETCO2 and breath sounds checked- equal and bilateral Secured at: 22 cm Tube secured with: Tape Dental Injury: Teeth and Oropharynx as per pre-operative assessment

## 2020-07-29 NOTE — Transfer of Care (Signed)
Immediate Anesthesia Transfer of Care Note  Patient: [name]  Procedure(s) Performed: Procedure(s): LEFT KNEE REVISION, POLYETHYLENE EXCHANGE (Left)  Patient Location: PACU  Anesthesia Type:General and Regional  Level of Consciousness: Patient easily awoken, sedated, comfortable, cooperative, following commands, responds to stimulation.   Airway & Oxygen Therapy: Patient spontaneously breathing, ventilating well, oxygen via simple oxygen mask.  Post-op Assessment: Report given to PACU RN, vital signs reviewed and stable, moving all extremities.   Post vital signs: Reviewed and stable.  Complications: No apparent anesthesia complications  Last Vitals:  Vitals Value Taken Time  BP 160/73 07/29/20 1341  Temp 36.4 C 07/29/20 1342  Pulse 76 07/29/20 1345  Resp 17 07/29/20 1345  SpO2 99 % 07/29/20 1345  Vitals shown include unvalidated device data.  Last Pain:  Vitals:   07/29/20 1341  TempSrc:   PainSc: Asleep      Patients Stated Pain Goal: 3 (09/32/35 5732)  Complications: No complications documented.

## 2020-07-29 NOTE — Interval H&P Note (Signed)
History and Physical Interval Note:  07/29/2020 9:50 AM  [name]  has presented today for surgery, with the diagnosis of unstable left total knee arthroplasty.  The various methods of treatment have been discussed with the patient and family. After consideration of risks, benefits and other options for treatment, the patient has consented to  Procedure(s) with comments: Left knee polyethylene revision (Left) - 52min as a surgical intervention.  The patient's history has been reviewed, patient examined, no change in status, stable for surgery.  I have reviewed the patient's chart and labs.  Questions were answered to the patient's satisfaction.     Pilar Plate Hayde Kilgour

## 2020-07-29 NOTE — Discharge Instructions (Addendum)
 Frank Aluisio, MD Total Joint Specialist [facility] [address] [place],  [address] [phone]  TOTAL KNEE REPLACEMENT POSTOPERATIVE DIRECTIONS    Knee Rehabilitation, Guidelines Following Surgery  Results after knee surgery are often greatly improved when you follow the exercise, range of motion and muscle strengthening exercises prescribed by your doctor. Safety measures are also important to protect the knee from further injury. If any of these exercises cause you to have increased pain or swelling in your knee joint, decrease the amount until you are comfortable again and slowly increase them. If you have problems or questions, call your caregiver or physical therapist for advice.   BLOOD CLOT PREVENTION . Take a 10 mg Xarelto once a day for three weeks following surgery. Then take an 81 mg Aspirin once a day for three weeks. Then discontinue Aspirin. . You may resume your vitamins/supplements once you have discontinued the Xarelto. . Do not take any NSAIDs (Advil, Aleve, Ibuprofen, Meloxicam, etc.) until you have discontinued the Xarelto.   HOME CARE INSTRUCTIONS  . Remove items at home which could result in a fall. This includes throw rugs or furniture in walking pathways.  . ICE to the affected knee as much as tolerated. Icing helps control swelling. If the swelling is well controlled you will be more comfortable and rehab easier. Continue to use ice on the knee for pain and swelling from surgery. You may notice swelling that will progress down to the foot and ankle. This is normal after surgery. Elevate the leg when you are not up walking on it.    . Continue to use the breathing machine which will help keep your temperature down. It is common for your temperature to cycle up and down following surgery, especially at night when you are not up moving around and exerting yourself. The breathing machine keeps your lungs expanded and your  temperature down. . Do not place pillow under the operative knee, focus on keeping the knee straight while resting  DIET You may resume your previous home diet once you are discharged from the hospital.  DRESSING / WOUND CARE / SHOWERING . Keep your bulky bandage on for 2 days. On the third post-operative day you may remove the Ace bandage and gauze. There is a waterproof adhesive bandage on your skin which will stay in place until your first follow-up appointment. Once you remove this you will not need to place another bandage . You may begin showering 3 days following surgery, but do not submerge the incision under water.  ACTIVITY For the first 5 days, the key is rest and control of pain and swelling . Do your home exercises twice a day starting on post-operative day 3. On the days you go to physical therapy, just do the home exercises once that day. . You should rest, ice and elevate the leg for 50 minutes out of every hour. Get up and walk/stretch for 10 minutes per hour. After 5 days you can increase your activity slowly as tolerated. . Walk with your walker as instructed. Use the walker until you are comfortable transitioning to a cane. Walk with the cane in the opposite hand of the operative leg. You may discontinue the cane once you are comfortable and walking steadily. . Avoid periods of inactivity such as sitting longer than an hour when not asleep. This helps prevent blood clots.  . You may discontinue the knee immobilizer once you are able to perform a straight leg   raise while lying down. . You may resume a sexual relationship in one month or when given the OK by your doctor.  . You may return to work once you are cleared by your doctor.  . Do not drive a car for 6 weeks or until released by your surgeon.  . Do not drive while taking narcotics.  TED HOSE STOCKINGS Wear the elastic stockings on both legs for three weeks following surgery during the day. You may remove them at night  for sleeping.  WEIGHT BEARING Weight bearing as tolerated with assist device (walker, cane, etc) as directed, use it as long as suggested by your surgeon or therapist, typically at least 4-6 weeks.  POSTOPERATIVE CONSTIPATION PROTOCOL Constipation - defined medically as fewer than three stools per week and severe constipation as less than one stool per week.  One of the most common issues patients have following surgery is constipation.  Even if you have a regular bowel pattern at home, your normal regimen is likely to be disrupted due to multiple reasons following surgery.  Combination of anesthesia, postoperative narcotics, change in appetite and fluid intake all can affect your bowels.  In order to avoid complications following surgery, here are some recommendations in order to help you during your recovery period.  . Colace (docusate) - Pick up an over-the-counter form of Colace or another stool softener and take twice a day as long as you are requiring postoperative pain medications.  Take with a full glass of water daily.  If you experience loose stools or diarrhea, hold the colace until you stool forms back up. If your symptoms do not get better within 1 week or if they get worse, check with your doctor. . Dulcolax (bisacodyl) - Pick up over-the-counter and take as directed by the product packaging as needed to assist with the movement of your bowels.  Take with a full glass of water.  Use this product as needed if not relieved by Colace only.  . MiraLax (polyethylene glycol) - Pick up over-the-counter to have on hand. MiraLax is a solution that will increase the amount of water in your bowels to assist with bowel movements.  Take as directed and can mix with a glass of water, juice, soda, coffee, or tea. Take if you go more than two days without a movement. Do not use MiraLax more than once per day. Call your doctor if you are still constipated or irregular after using this medication for 7 days  in a row.  If you continue to have problems with postoperative constipation, please contact the office for further assistance and recommendations.  If you experience "the worst abdominal pain ever" or develop nausea or vomiting, please contact the office immediatly for further recommendations for treatment.  ITCHING If you experience itching with your medications, try taking only a single pain pill, or even half a pain pill at a time.  You can also use Benadryl over the counter for itching or also to help with sleep.   MEDICATIONS See your medication summary on the "After Visit Summary" that the nursing staff will review with you prior to discharge.  You may have some home medications which will be placed on hold until you complete the course of blood thinner medication.  It is important for you to complete the blood thinner medication as prescribed by your surgeon.  Continue your approved medications as instructed at time of discharge.  PRECAUTIONS . If you experience chest pain or shortness of breath -   call 911 immediately for transfer to the hospital emergency department.  . If you develop a fever greater that 101 F, purulent drainage from wound, increased redness or drainage from wound, foul odor from the wound/dressing, or calf pain - CONTACT YOUR SURGEON.                                                   FOLLOW-UP APPOINTMENTS Make sure you keep all of your appointments after your operation with your surgeon and caregivers. You should call the office at the above phone number and make an appointment for approximately two weeks after the date of your surgery or on the date instructed by your surgeon outlined in the "After Visit Summary".  RANGE OF MOTION AND STRENGTHENING EXERCISES  Rehabilitation of the knee is important following a knee injury or an operation. After just a few days of immobilization, the muscles of the thigh which control the knee become weakened and shrink (atrophy). Knee  exercises are designed to build up the tone and strength of the thigh muscles and to improve knee motion. Often times heat used for twenty to thirty minutes before working out will loosen up your tissues and help with improving the range of motion but do not use heat for the first two weeks following surgery. These exercises can be done on a training (exercise) mat, on the floor, on a table or on a bed. Use what ever works the best and is most comfortable for you Knee exercises include:  . Leg Lifts - While your knee is still immobilized in a splint or cast, you can do straight leg raises. Lift the leg to 60 degrees, hold for 3 sec, and slowly lower the leg. Repeat 10-20 times 2-3 times daily. Perform this exercise against resistance later as your knee gets better.  Javier Docker and Hamstring Sets - Tighten up the muscle on the front of the thigh (Quad) and hold for 5-10 sec. Repeat this 10-20 times hourly. Hamstring sets are done by pushing the foot backward against an object and holding for 5-10 sec. Repeat as with quad sets.   Leg Slides: Lying on your back, slowly slide your foot toward your buttocks, bending your knee up off the floor (only go as far as is comfortable). Then slowly slide your foot back down until your leg is flat on the floor again.  Angel Wings: Lying on your back spread your legs to the side as far apart as you can without causing discomfort.  A rehabilitation program following serious knee injuries can speed recovery and prevent re-injury in the future due to weakened muscles. Contact your doctor or a physical therapist for more information on knee rehabilitation.   POST-OPERATIVE OPIOID TAPER INSTRUCTIONS: . It is important to wean off of your opioid medication as soon as possible. If you do not need pain medication after your surgery it is ok to stop day one. Marland Kitchen Opioids include: o Codeine, Hydrocodone(Norco, Vicodin), Oxycodone(Percocet, oxycontin) and hydromorphone amongst others.   . Long term and even short term use of opiods can cause: o Increased pain response o Dependence o Constipation o Depression o Respiratory depression o And more.  . Withdrawal symptoms can include o Flu like symptoms o Nausea, vomiting o And more . Techniques to manage these symptoms o Hydrate well o Eat regular healthy meals  o Stay active o Use relaxation techniques(deep breathing, meditating, yoga) . Do Not substitute Alcohol to help with tapering . If you have been on opioids for less than two weeks and do not have pain than it is ok to stop all together.  . Plan to wean off of opioids o This plan should start within one week post op of your joint replacement. o Maintain the same interval or time between taking each dose and first decrease the dose.  o Cut the total daily intake of opioids by one tablet each day o Next start to increase the time between doses. o The last dose that should be eliminated is the evening dose.     IF YOU ARE TRANSFERRED TO A SKILLED REHAB FACILITY If the patient is transferred to a skilled rehab facility following release from the hospital, a list of the current medications will be sent to the facility for the patient to continue.  When discharged from the skilled rehab facility, please have the facility set up the patient's [place] prior to being released. Also, the skilled facility will be responsible for providing the patient with their medications at time of release from the facility to include their pain medication, the muscle relaxants, and their blood thinner medication. If the patient is still at the rehab facility at time of the two week follow up appointment, the skilled rehab facility will also need to assist the patient in arranging follow up appointment in our office and any transportation needs.  MAKE SURE YOU:  . Understand these instructions.  . Get help right away if you are not doing well or get worse.   DENTAL  ANTIBIOTICS:  In most cases prophylactic antibiotics for Dental procdeures after total joint surgery are not necessary.  Exceptions are as follows:  1. History of prior total joint infection  2. Severely immunocompromised (Organ Transplant, cancer chemotherapy, Rheumatoid biologic meds such as [place])  3. Poorly controlled diabetes (A1C &gt; 8.0, blood glucose over 200)  If you have one of these conditions, contact your surgeon for an antibiotic prescription, prior to your dental procedure.    Pick up stool softner and laxative for home use following surgery while on pain medications. Do not submerge incision under water. Please use good hand washing techniques while changing dressing each day. May shower starting three days after surgery. Please use a clean towel to pat the incision dry following showers. Continue to use ice for pain and swelling after surgery. Do not use any lotions or creams on the incision until instructed by your surgeon.  Information on my medicine - XARELTO (Rivaroxaban)  This medication education was reviewed with me or my healthcare representative as part of my discharge preparation.  The pharmacist that spoke with me during my hospital stay was:  Graylin Shiver PharmD  Why was Xarelto prescribed for you? Xarelto was prescribed for you to reduce the risk of blood clots forming after orthopedic surgery. The medical term for these abnormal blood clots is venous thromboembolism (VTE).  What do you need to know about xarelto ? Take your Xarelto ONCE DAILY at the same time every day. You may take it either with or without food.  If you have difficulty swallowing the tablet whole, you may crush it and mix in applesauce just prior to taking your dose.  Take Xarelto exactly as prescribed by your doctor and DO NOT stop taking Xarelto without talking to the doctor who prescribed the medication.  Stopping  without other VTE prevention medication to take the  place of Xarelto may increase your risk of developing a clot.  After discharge, you should have regular check-up appointments with your healthcare provider that is prescribing your Xarelto.    What do you do if you miss a dose? If you miss a dose, take it as soon as you remember on the same day then continue your regularly scheduled once daily regimen the next day. Do not take two doses of Xarelto on the same day.   Important Safety Information A possible side effect of Xarelto is bleeding. You should call your healthcare provider right away if you experience any of the following: ? Bleeding from an injury or your nose that does not stop. ? Unusual colored urine (red or dark brown) or unusual colored stools (red or black). ? Unusual bruising for unknown reasons. ? A serious fall or if you hit your head (even if there is no bleeding).  Some medicines may interact with Xarelto and might increase your risk of bleeding while on Xarelto. To help avoid this, consult your healthcare provider or pharmacist prior to using any new prescription or non-prescription medications, including herbals, vitamins, non-steroidal anti-inflammatory drugs (NSAIDs) and supplements.  This website has more information on Xarelto: [email or web address]

## 2020-07-29 NOTE — Anesthesia Preprocedure Evaluation (Signed)
Anesthesia Evaluation  Patient identified by MRN, date of birth, ID band Patient awake    Reviewed: Allergy & Precautions, H&P , NPO status , Patient's Chart, lab work & pertinent test results  Airway Mallampati: I  TM Distance: >3 FB Neck ROM: Full    Dental  (+) Teeth Intact, Dental Advisory Given, Caps   Pulmonary sleep apnea and Continuous Positive Airway Pressure Ventilation ,    Pulmonary exam normal breath sounds clear to auscultation       Cardiovascular hypertension, Pt. on medications and Pt. on home beta blockers Normal cardiovascular exam+ dysrhythmias  Rhythm:Regular Rate:Normal     Neuro/Psych negative neurological ROS  negative psych ROS   GI/Hepatic GERD  Controlled,(+)     substance abuse  alcohol use,   Endo/Other  negative endocrine ROS  Renal/GU negative Renal ROS  negative genitourinary   Musculoskeletal  (+) Arthritis , Osteoarthritis,    Abdominal   Peds negative pediatric ROS (+)  Hematology negative hematology ROS (+)   Anesthesia Other Findings   Reproductive/Obstetrics negative OB ROS                            Anesthesia Physical  Anesthesia Plan  ASA: III  Anesthesia Plan: General   Post-op Pain Management: GA combined w/ Regional for post-op pain   Induction: Intravenous  PONV Risk Score and Plan: 3 and Ondansetron, Dexamethasone, Treatment may vary due to age or medical condition and Diphenhydramine  Airway Management Planned: LMA  Additional Equipment: None  Intra-op Plan:   Post-operative Plan: Extubation in OR  Informed Consent: I have reviewed the patients History and Physical, chart, labs and discussed the procedure including the risks, benefits and alternatives for the proposed anesthesia with the patient or authorized representative who has indicated his/her understanding and acceptance.     Dental advisory given  Plan Discussed  with: CRNA  Anesthesia Plan Comments:       Anesthesia Quick Evaluation

## 2020-07-29 NOTE — Anesthesia Procedure Notes (Signed)
Anesthesia Regional Block: Adductor canal block   Pre-Anesthetic Checklist: ,, timeout performed, Correct Patient, Correct Site, Correct Laterality, Correct Procedure, Correct Position, site marked, Risks and benefits discussed,  Surgical consent,  Pre-op evaluation,  At surgeon's request and post-op pain management  Laterality: Left  Prep: chloraprep       Needles:  Injection technique: Single-shot  Needle Type: Stimiplex     Needle Length: 9cm  Needle Gauge: 21     Additional Needles:   Procedures:,,,, ultrasound used (permanent image in chart),,,,  Narrative:  Start time: 07/29/2020 10:24 AM End time: 07/29/2020 10:39 AM Injection made incrementally with aspirations every 5 mL.  Performed by: Personally  Anesthesiologist: Nolon Nations, MD  Additional Notes: BP cuff, EKG monitors applied. Sedation begun. Artery and nerve location verified with U/S and anesthetic injected incrementally, slowly, and after negative aspirations under direct u/s guidance. Good fascial /perineural spread. Tolerated well.

## 2020-07-29 NOTE — Brief Op Note (Signed)
07/29/2020  1:07 PM  PATIENT:  [name]  86 y.o. male  PRE-OPERATIVE DIAGNOSIS:  unstable left total knee arthroplasty  POST-OPERATIVE DIAGNOSIS:  unstable left total knee arthroplasty  PROCEDURE:  Procedure(s): LEFT KNEE REVISION, POLYETHYLENE EXCHANGE (Left)  SURGEON:  Surgeon(s) and Role:    Gaynelle Arabian, MD - Primary  PHYSICIAN ASSISTANT:   ASSISTANTS: Fenton Foy, PA-C   ANESTHESIA:   general and Adductor canal block  EBL:  25 mL   BLOOD ADMINISTERED:none  DRAINS: none   LOCAL MEDICATIONS USED:  OTHER Exparel  COUNTS:  YES  TOURNIQUET:   Total Tourniquet Time Documented: Thigh (Left) - 23 minutes Total: Thigh (Left) - 23 minutes   DICTATION: .Other Dictation: Dictation Number [id number]  PLAN OF CARE: Admit for overnight observation  PATIENT DISPOSITION:  PACU - hemodynamically stable.

## 2020-07-29 NOTE — Progress Notes (Signed)
Orthopedic Tech Progress Note Patient Details:  [name] 1934/11/14 [id number]  CPM Left Knee CPM Left Knee: On Left Knee Flexion (Degrees): 40 Left Knee Extension (Degrees): 10  Post Interventions Patient Tolerated: Well Instructions Provided: Care of device  [name] 07/29/2020, 2:19 PM

## 2020-07-30 ENCOUNTER — Other Ambulatory Visit (HOSPITAL_COMMUNITY): Payer: Self-pay

## 2020-07-30 ENCOUNTER — Encounter (HOSPITAL_COMMUNITY): Payer: Self-pay | Admitting: Orthopedic Surgery

## 2020-07-30 LAB — CBC
HCT: 36.4 % — ABNORMAL LOW (ref 39.0–52.0)
Hemoglobin: 12.2 g/dL — ABNORMAL LOW (ref 13.0–17.0)
MCH: 30.7 pg (ref 26.0–34.0)
MCHC: 33.5 g/dL (ref 30.0–36.0)
MCV: 91.7 fL (ref 80.0–100.0)
Platelets: 198 10*3/uL (ref 150–400)
RBC: 3.97 MIL/uL — ABNORMAL LOW (ref 4.22–5.81)
RDW: 13.2 % (ref 11.5–15.5)
WBC: 13.9 10*3/uL — ABNORMAL HIGH (ref 4.0–10.5)
nRBC: 0 % (ref 0.0–0.2)

## 2020-07-30 LAB — BASIC METABOLIC PANEL
Anion gap: 8 (ref 5–15)
BUN: 12 mg/dL (ref 8–23)
CO2: 25 mmol/L (ref 22–32)
Calcium: 8.8 mg/dL — ABNORMAL LOW (ref 8.9–10.3)
Chloride: 105 mmol/L (ref 98–111)
Creatinine, Ser: 0.63 mg/dL (ref 0.61–1.24)
GFR, Estimated: >60 mL/min (ref >60)
Glucose, Bld: 117 mg/dL — ABNORMAL HIGH (ref 70–99)
Potassium: 4.1 mmol/L (ref 3.5–5.1)
Sodium: 138 mmol/L (ref 135–145)

## 2020-07-30 MED ORDER — METHOCARBAMOL 500 MG PO TABS
500.0000 mg | ORAL_TABLET | Freq: Four times a day (QID) | ORAL | 0 refills | Status: DC | PRN
  Filled 2020-07-30: qty 40, 10d supply, fill #0

## 2020-07-30 MED ORDER — RIVAROXABAN 10 MG PO TABS
10.0000 mg | ORAL_TABLET | Freq: Every day | ORAL | 0 refills | Status: DC
  Filled 2020-07-30: qty 20, 20d supply, fill #0

## 2020-07-30 MED ORDER — OXYCODONE HCL 5 MG PO TABS
5.0000 mg | ORAL_TABLET | Freq: Four times a day (QID) | ORAL | 0 refills | Status: DC | PRN
  Filled 2020-07-30: qty 42, 5d supply, fill #0

## 2020-07-30 MED ORDER — TRAMADOL HCL 50 MG PO TABS
50.0000 mg | ORAL_TABLET | Freq: Four times a day (QID) | ORAL | 0 refills | Status: DC | PRN
  Filled 2020-07-30: qty 40, 5d supply, fill #0

## 2020-07-30 NOTE — Evaluation (Signed)
Physical Therapy Evaluation Patient Details Name: [name] MRN: [id number] DOB: 06/16/1934 Today's Date: 07/30/2020   History of Present Illness  Pt s/p L TKR revision (poly exchange)  Clinical Impression  Pt s/p L TKR revision and presents with decreased L LE strength/ROM and post op pain limiting functional mobility.  Pt should progress to dc home with family assist and reports first OP PT scheduled for Monday 08/03/20.    Follow Up Recommendations Follow surgeon's recommendation for DC plan and follow-up therapies    Equipment Recommendations  None recommended by PT    Recommendations for Other Services       Precautions / Restrictions Precautions Precautions: Knee;Fall Restrictions Weight Bearing Restrictions: No Other Position/Activity Restrictions: WBAT      Mobility  Bed Mobility Overal bed mobility: Needs Assistance Bed Mobility: Supine to Sit     Supine to sit: Min guard     General bed mobility comments: cues for sequence and use of R LE to self assist.    Transfers Overall transfer level: Needs assistance   Transfers: Sit to/from Stand Sit to Stand: Min assist         General transfer comment: cues for LE management and use of UEs to self assist.  Physical assist to bring wt up and fwd and to balance in initial standing  Ambulation/Gait Ambulation/Gait assistance: Min assist;Min guard Gait Distance (Feet): 120 Feet Assistive device: Rolling walker (2 wheeled) Gait Pattern/deviations: Step-to pattern;Decreased step length - right;Decreased step length - left;Shuffle;Trunk flexed     General Gait Details: cues for sequence, posture and position from [employer]            Wheelchair Mobility    Modified Rankin (Stroke Patients Only)       Balance Overall balance assessment: Needs assistance Sitting-balance support: No upper extremity supported;Feet supported Sitting balance-Leahy Scale: Good     Standing balance support:  Bilateral upper extremity supported Standing balance-Leahy Scale: Poor                               Pertinent Vitals/Pain Pain Assessment: 0-10 Pain Score: 4  Pain Location: L knee Pain Descriptors / Indicators: Aching;Sore Pain Intervention(s): Limited activity within patient's tolerance;Monitored during session;Premedicated before session;Ice applied    Home Living Family/patient expects to be discharged to:: Private residence Living Arrangements: Spouse/significant other Available Help at Discharge: Family Type of Home: House Home Access: Stairs to enter Entrance Stairs-Rails: None Entrance Stairs-Number of Steps: 2 Home Layout: One level Home Equipment: Environmental consultant - 2 wheels      Prior Function Level of Independence: Independent               Hand Dominance        Extremity/Trunk Assessment   Upper Extremity Assessment Upper Extremity Assessment: Overall WFL for tasks assessed    Lower Extremity Assessment Lower Extremity Assessment: LLE deficits/detail LLE Deficits / Details: IND SLR, AAROM at knee -5 - 45    Cervical / Trunk Assessment Cervical / Trunk Assessment: Normal  Communication   Communication: No difficulties  Cognition Arousal/Alertness: Awake/alert Behavior During Therapy: WFL for tasks assessed/performed;Impulsive Overall Cognitive Status: Within Functional Limits for tasks assessed                                        General Comments  Exercises Total Joint Exercises Ankle Circles/Pumps: AROM;Both;20 reps;Supine Quad Sets: AROM;Both;10 reps;Supine Heel Slides: AAROM;Left;15 reps;Supine Straight Leg Raises: AAROM;AROM;Left;15 reps;Supine   Assessment/Plan    PT Assessment Patient needs continued PT services  PT Problem List Decreased strength;Decreased range of motion;Decreased activity tolerance;Decreased balance;Decreased mobility;Decreased knowledge of use of DME;Pain       PT Treatment  Interventions DME instruction;Gait training;Stair training;Functional mobility training;Therapeutic activities;Therapeutic exercise;Patient/family education    PT Goals (Current goals can be found in the Care Plan section)  Acute Rehab PT Goals Patient Stated Goal: Regain IND PT Goal Formulation: With patient Time For Goal Achievement: 08/06/20 Potential to Achieve Goals: Good    Frequency 7X/week   Barriers to discharge        Co-evaluation               AM-PAC PT "6 Clicks" Mobility  Outcome Measure Help needed turning from your back to your side while in a flat bed without using bedrails?: A Little Help needed moving from lying on your back to sitting on the side of a flat bed without using bedrails?: A Little Help needed moving to and from a bed to a chair (including a wheelchair)?: A Little Help needed standing up from a chair using your arms (e.g., wheelchair or bedside chair)?: A Little Help needed to walk in hospital room?: A Little Help needed climbing 3-5 steps with a railing? : A Little 6 Click Score: 18    End of Session Equipment Utilized During Treatment: Gait belt Activity Tolerance: Patient tolerated treatment well Patient left: in chair;with call bell/phone within reach;with chair alarm set Nurse Communication: Mobility status PT Visit Diagnosis: Difficulty in walking, not elsewhere classified (R26.2)    Time: 9379-0240 PT Time Calculation (min) (ACUTE ONLY): 36 min   Charges:   PT Evaluation $PT Eval Low Complexity: 1 Low PT Treatments $Gait Training: 8-22 mins        [place] Pager [phone] Office [phone]   Theodoros Stjames 07/30/2020, 3:31 PM

## 2020-07-30 NOTE — Progress Notes (Signed)
Physical Therapy Treatment Patient Details Name: [name] MRN: [id number] DOB: March 28, 1935 Today's Date: 07/30/2020    History of Present Illness Pt s/p L TKR revision (poly exchange)    PT Comments    Pt very motivated to progress and hopeful for dc home this date.  Pt up to ambulate increased distance in hall and with noted improvement in stability.  Pt negotiated stairs with min difficulty and returned to chair in room.  Limited written HEP reviewed but pt did not perform 2* notable blood saturation of knee dressing - RN alerted and to room to perform dressing change.   Follow Up Recommendations  Follow surgeon's recommendation for DC plan and follow-up therapies     Equipment Recommendations  None recommended by PT    Recommendations for Other Services       Precautions / Restrictions Precautions Precautions: Knee;Fall Restrictions Weight Bearing Restrictions: No Other Position/Activity Restrictions: WBAT    Mobility  Bed Mobility Overal bed mobility: Needs Assistance Bed Mobility: Supine to Sit     Supine to sit: Min guard     General bed mobility comments: Pt up in chair and requests back to same    Transfers Overall transfer level: Needs assistance Equipment used: Rolling walker (2 wheeled) Transfers: Sit to/from Stand Sit to Stand: Min guard;Supervision         General transfer comment: cues for LE management and use of UEs to self assist.  Ambulation/Gait Ambulation/Gait assistance: Min guard;Supervision Gait Distance (Feet): 150 Feet Assistive device: Rolling walker (2 wheeled) Gait Pattern/deviations: Decreased step length - right;Decreased step length - left;Shuffle;Trunk flexed;Step-to pattern;Step-through pattern     General Gait Details: cues for sequence, posture and position from RW   Stairs Stairs: Yes Stairs assistance: Min assist Stair Management: No rails;Step to pattern;Forwards;With walker Number of Stairs: 4 General stair  comments: 2 steps twice with RW at top and use of door frame on L to self assist.  descent with RW only.  CUes for sequence and foot/RW placement   Wheelchair Mobility    Modified Rankin (Stroke Patients Only)       Balance Overall balance assessment: Needs assistance Sitting-balance support: No upper extremity supported;Feet supported Sitting balance-Leahy Scale: Good     Standing balance support: No upper extremity supported Standing balance-Leahy Scale: Fair                              Cognition Arousal/Alertness: Awake/alert Behavior During Therapy: WFL for tasks assessed/performed;Impulsive Overall Cognitive Status: Within Functional Limits for tasks assessed                                        Exercises Total Joint Exercises Ankle Circles/Pumps: AROM;Both;20 reps;Supine Quad Sets: AROM;Both;10 reps;Supine Heel Slides: AAROM;Left;15 reps;Supine Straight Leg Raises: AAROM;AROM;Left;15 reps;Supine    General Comments        Pertinent Vitals/Pain Pain Assessment: 0-10 Pain Score: 3  Pain Location: L knee Pain Descriptors / Indicators: Aching;Sore Pain Intervention(s): Limited activity within patient's tolerance;Monitored during session;Premedicated before session    [place] expects to be discharged to:: Private residence Living Arrangements: Spouse/significant other Available Help at Discharge: Family Type of Home: House Home Access: Stairs to enter Entrance Stairs-Rails: None Home Layout: One level Home Equipment: Environmental consultant - 2 wheels      Prior Function Level of Independence:  Independent          PT Goals (current goals can now be found in the care plan section) Acute Rehab PT Goals Patient Stated Goal: Regain IND PT Goal Formulation: With patient Time For Goal Achievement: 08/06/20 Potential to Achieve Goals: Good Progress towards PT goals: Progressing toward goals    Frequency     7X/week      PT Plan Current plan remains appropriate    Co-evaluation              AM-PAC PT "6 Clicks" Mobility   Outcome Measure  Help needed turning from your back to your side while in a flat bed without using bedrails?: A Little Help needed moving from lying on your back to sitting on the side of a flat bed without using bedrails?: A Little Help needed moving to and from a bed to a chair (including a wheelchair)?: A Little Help needed standing up from a chair using your arms (e.g., wheelchair or bedside chair)?: A Little Help needed to walk in hospital room?: A Little Help needed climbing 3-5 steps with a railing? : A Little 6 Click Score: 18    End of Session Equipment Utilized During Treatment: Gait belt Activity Tolerance: Patient tolerated treatment well Patient left: in chair;with call bell/phone within reach;with chair alarm set Nurse Communication: Mobility status PT Visit Diagnosis: Difficulty in walking, not elsewhere classified (R26.2)     Time: 1350-1415 PT Time Calculation (min) (ACUTE ONLY): 25 min  Charges:  $Gait Training: 8-22 mins $Therapeutic Activity: 8-22 mins                     [place] Pager [phone] Office [phone]    Alaisa Moffitt 07/30/2020, 3:39 PM

## 2020-07-30 NOTE — Progress Notes (Signed)
Patient S/P L TKR revision. After therapy session pt Aquacel dressing was soiled with large amount of new blood. Aquacel removed and clean gauze applied. MD/PA paged.

## 2020-07-30 NOTE — Progress Notes (Signed)
Subjective: 1 Day Post-Op Procedure(s) (LRB): LEFT KNEE REVISION, POLYETHYLENE EXCHANGE (Left) Patient reports pain as mild.   Patient seen in rounds by Dr. Wynelle Link. Patient is well, and has had no acute complaints or problems. No acute overnight events. Denies SOB, chest pain, or calf pain.  We will begin therapy today.   Objective: Vital signs in last 24 hours: Temp:  [97.4 F (36.3 C)-97.9 F (36.6 C)] 97.9 F (36.6 C) (04/28 0532) Pulse Rate:  [28-77] 65 (04/28 0532) Resp:  [10-20] 16 (04/28 0532) BP: (102-170)/(59-100) 143/59 (04/28 0532) SpO2:  [94 %-100 %] 97 % (04/28 0532) Weight:  [86.2 kg] 86.2 kg (04/27 0946)  Intake/Output from previous day:  Intake/Output Summary (Last 24 hours) at 07/30/2020 0753 Last data filed at 07/30/2020 8099 Gross per 24 hour  Intake 1825.06 ml  Output 2075 ml  Net -249.94 ml     Intake/Output this shift: No intake/output data recorded.  Labs: Recent Labs    07/30/20 0313  HGB 12.2*   Recent Labs    07/30/20 0313  WBC 13.9*  RBC 3.97*  HCT 36.4*  PLT 198   Recent Labs    07/30/20 0313  NA 138  K 4.1  CL 105  CO2 25  BUN 12  CREATININE 0.63  GLUCOSE 117*  CALCIUM 8.8*   No results for input(s): LABPT, INR in the last 72 hours.  Exam: General - Patient is Alert and Oriented Extremity - Neurologically intact Neurovascular intact Intact pulses distally Dorsiflexion/Plantar flexion intact Dressing - dressing C/D/I Motor Function - intact, moving foot and toes well on exam.   Past Medical History:  Diagnosis Date  . Allergy   . Arthritis   . Cancer [facility])    prostate CA 1997, skin CA  . Cataract   . Colon polyp   . Dysrhythmia    PACs  . GERD (gastroesophageal reflux disease)   . History of colon polyps    adenoma;[facility]  . History of prostate cancer   . History of skin cancer   . HTN (hypertension)   . Other and unspecified hyperlipidemia   . PAC (premature atrial contraction)   . Sleep apnea     uses CPAP    Assessment/Plan: 1 Day Post-Op Procedure(s) (LRB): LEFT KNEE REVISION, POLYETHYLENE EXCHANGE (Left) Principal Problem:   Failed total knee arthroplasty (HCC) Active Problems:   Failed total left knee replacement (HCC)  Estimated body mass index is 27.26 kg/m as calculated from the following:   Height as of this encounter: 5\' 10"  (1.778 m).   Weight as of this encounter: 86.2 kg. Up with therapy   Patient's anticipated LOS is less than 2 midnights, meeting these requirements: - Lives within 1 hour of care - Has a competent adult at home to recover with post-op recover - NO history of  - Chronic pain requiring opioids  - Diabetes  - Coronary Artery Disease  - Heart failure  - Heart attack  - Stroke  - DVT/VTE  - Cardiac arrhythmia  - Respiratory Failure/COPD  - Renal failure  - Anemia  - Advanced Liver disease     DVT Prophylaxis - Xarelto Weight bearing as tolerated. Continue therapy.  Plan is to go Home after hospital stay.  Plan for two sessions of PT today, and if meeting goals, will plan for discharge this afternoon.   Patient to follow up in two weeks with Dr. Wynelle Link in clinic.   The PDMP database was reviewed today prior to  any opioid medications being prescribed to this patient.   Fenton Foy, MBA, PA-C Orthopedic Surgery 07/30/2020, 7:53 AM

## 2020-07-30 NOTE — TOC Transition Note (Signed)
Transition of Care [facility]) - CM/SW Discharge Note  Patient Details  Name: [name] MRN: [id number] Date of Birth: 10-09-1934  [facility]) CM/SW Contact:  Sherie Don, LCSW Phone Number: 07/30/2020, 10:07 AM  Clinical Narrative: Patient is expected to discharge home today. CSW met with patient to confirm discharge plan. Patient will go to OPPT through [facility]. Patient has a rolling walker and a raised toilet at home, so there are no DME needs at this time. TOC signing off.  Final next level of care: OP Rehab Barriers to Discharge: No Barriers Identified  Patient Goals and CMS Choice Patient states their goals for this hospitalization and ongoing recovery are:: Discharge home with OPPT through [facility] Medicare.gov Compare Post Acute Care list provided to:: Patient Choice offered to / list presented to : Patient  Discharge Plan and Services         DME Arranged: N/A DME Agency: NA  Readmission Risk Interventions No flowsheet data found.

## 2020-07-30 NOTE — Op Note (Signed)
[name], [name] MEDICAL RECORD NO: [id number] ACCOUNT NO: [id number] DATE OF BIRTH: 23-Jun-1934 FACILITY: [facility]: [facility] PHYSICIAN: Dione Plover. Jojuan Champney, MD  Operative Report   DATE OF PROCEDURE: 07/29/2020  PREOPERATIVE DIAGNOSIS:  Unstable left total knee arthroplasty.  POSTOPERATIVE DIAGNOSIS:  Unstable left total knee arthroplasty.  PROCEDURE:  Left knee polyethylene revision.  SURGEON:  Dione Plover. Jerome Otter, MD  ASSISTANT:  Fenton Foy, PA-C  ANESTHESIA:  General and adductor canal block.  ESTIMATED BLOOD LOSS:  Minimal.  DRAIN:  None.  TOURNIQUET TIME:  22 minutes at 300 mmHg.  COMPLICATIONS:  None.  CONDITION:  Stable to recovery.  BRIEF CLINICAL NOTE:  The patient is an 86 year old male who had a total knee arthroplasty done many years ago.  He recently has developed recurrent effusions.  He has had an infection workup, which was negative.  Bone scan was also negative for any  loosening.  He has developed some instability, presumed secondary to polyethylene wear.  He presents now for polyethylene versus total knee revision.  DESCRIPTION OF PROCEDURE:  After successful administration of an adductor canal block and general anesthetic.  A tourniquet was placed high on the left thigh and left lower extremity prepped and draped in the usual sterile fashion.  The extremity was  wrapped in an Esmarch, tourniquet inflated to 300 mmHg.  Exam under anesthesia shows significant varus valgus laxity in full extension as well as AP laxity and 90 degrees of flexion.  An incision is made on the anterior knee with a 10 blade through the  subcutaneous tissue to the level of the extensor mechanism.  A fresh blade was used to make a medial parapatellar arthrotomy.  Soft tissue in the proximal medial tibia is subperiosteally elevated the joint line with a knife and to the semimembranosus  bursa with a Cobb elevator.  Soft tissue laterally is elevated with attention being paid to avoid  the patellar tendon and tibial tubercle.  He had significant amount of scar tissue, which was excised.  I was then able to evert the patella and flex the  knee 90 degrees.  I was able to remove his old polyethylene, which was a size 5, 12.5 mm thick.  I trialed a 15 mm trial and it was still a tiny bit of varus valgus place.  We went to 17.5, which allowed for full extension with excellent varus valgus and  AP balance throughout full range of motion.  The components were inspected.  Both are very stable and are well fixed and well positioned.  The trial insert was removed and the permanent 17.5 mm size 5 rotating platform posterior-stabilized insert was  placed in the tibial tray.  The knee was reduced and again excellent stability was present throughout full range of motion.  20 mL of Exparel mixed with 60 mL of saline were injected into the extensor mechanism, periosteum of the femur subcutaneous  tissues.  Wound was copiously irrigated with saline solution and the arthrotomy closed with a running 0 Stratafix suture.  Flexion against gravity was 130 degrees.  Subcutaneous was then closed with interrupted 2-0 Vicryl and a subcuticular running 4-0  Monocryl.  The incision was cleaned and dried and Steri-Strips and a sterile dressing applied.  He was placed into a knee immobilizer, awakened, and transported to recovery in stable condition.  Note that a surgical assistant was of medical necessity for this procedure to do in a safe and expeditious manner.  Surgical assistant was necessary for retraction of  vital ligaments and neurovascular structures and for proper positioning of the limb for  removal of the old implant in safe and accurate placement of a new implant.   SUJ D: 07/29/2020 1:12:20 pm T: 07/30/2020 5:27:00 am  JOB: [id number]/ [id number]

## 2020-07-31 ENCOUNTER — Other Ambulatory Visit (HOSPITAL_COMMUNITY): Payer: Self-pay

## 2020-07-31 LAB — CBC
HCT: 32.8 % — ABNORMAL LOW (ref 39.0–52.0)
Hemoglobin: 11.1 g/dL — ABNORMAL LOW (ref 13.0–17.0)
MCH: 30.8 pg (ref 26.0–34.0)
MCHC: 33.8 g/dL (ref 30.0–36.0)
MCV: 91.1 fL (ref 80.0–100.0)
Platelets: 199 10*3/uL (ref 150–400)
RBC: 3.6 MIL/uL — ABNORMAL LOW (ref 4.22–5.81)
RDW: 13.3 % (ref 11.5–15.5)
WBC: 12.1 10*3/uL — ABNORMAL HIGH (ref 4.0–10.5)
nRBC: 0 % (ref 0.0–0.2)

## 2020-07-31 LAB — BASIC METABOLIC PANEL
Anion gap: 8 (ref 5–15)
BUN: 12 mg/dL (ref 8–23)
CO2: 26 mmol/L (ref 22–32)
Calcium: 8.5 mg/dL — ABNORMAL LOW (ref 8.9–10.3)
Chloride: 104 mmol/L (ref 98–111)
Creatinine, Ser: 0.61 mg/dL (ref 0.61–1.24)
GFR, Estimated: >60 mL/min (ref >60)
Glucose, Bld: 96 mg/dL (ref 70–99)
Potassium: 3.5 mmol/L (ref 3.5–5.1)
Sodium: 138 mmol/L (ref 135–145)

## 2020-07-31 NOTE — Progress Notes (Signed)
   Subjective: 2 Days Post-Op Procedure(s) (LRB): LEFT KNEE REVISION, POLYETHYLENE EXCHANGE (Left) Patient reports pain as mild.   Patient seen in rounds by Dr. Wynelle Link. Patient is well, with no current acute issues. He developed a hematoma beneath the incision yesterday that drained through the bandage during and after PT, but since dressing was reapplied, he is no longer actively draining and the bandage looked good this morning. No acute overnight events. Denies SOB, chest pain, or calf pain. Will continue PT today.  Plan is to go Home after hospital stay.  Objective: Vital signs in last 24 hours: Temp:  [97.5 F (36.4 C)-98.5 F (36.9 C)] 98 F (36.7 C) (04/29 0542) Pulse Rate:  [60-73] 60 (04/29 0542) Resp:  [16-17] 17 (04/29 0542) BP: (119-159)/(58-80) 144/60 (04/29 0542) SpO2:  [92 %-99 %] 92 % (04/29 0542)  Intake/Output from previous day:  Intake/Output Summary (Last 24 hours) at 07/31/2020 0716 Last data filed at 07/31/2020 0210 Gross per 24 hour  Intake 360 ml  Output 200 ml  Net 160 ml    Intake/Output this shift: No intake/output data recorded.  Labs: Recent Labs    07/30/20 0313 07/31/20 0321  HGB 12.2* 11.1*   Recent Labs    07/30/20 0313 07/31/20 0321  WBC 13.9* 12.1*  RBC 3.97* 3.60*  HCT 36.4* 32.8*  PLT 198 199   Recent Labs    07/30/20 0313 07/31/20 0321  NA 138 138  K 4.1 3.5  CL 105 104  CO2 25 26  BUN 12 12  CREATININE 0.63 0.61  GLUCOSE 117* 96  CALCIUM 8.8* 8.5*   No results for input(s): LABPT, INR in the last 72 hours.  Exam: General - Patient is Alert and Oriented Extremity - Neurologically intact Neurovascular intact Intact pulses distally Dorsiflexion/Plantar flexion intact Dressing/Incision - clean, dry, no drainage Motor Function - intact, moving foot and toes well on exam.   Past Medical History:  Diagnosis Date  . Allergy   . Arthritis   . Cancer [facility])    prostate CA 1997, skin CA  . Cataract   . Colon  polyp   . Dysrhythmia    PACs  . GERD (gastroesophageal reflux disease)   . History of colon polyps    adenoma;[facility]  . History of prostate cancer   . History of skin cancer   . HTN (hypertension)   . Other and unspecified hyperlipidemia   . PAC (premature atrial contraction)   . Sleep apnea    uses CPAP    Assessment/Plan: 2 Days Post-Op Procedure(s) (LRB): LEFT KNEE REVISION, POLYETHYLENE EXCHANGE (Left) Principal Problem:   Failed total knee arthroplasty (HCC) Active Problems:   Failed total left knee replacement (HCC)  Estimated body mass index is 27.26 kg/m as calculated from the following:   Height as of this encounter: 5\' 10"  (1.778 m).   Weight as of this encounter: 86.2 kg. Up with therapy  DVT Prophylaxis - Xarelto Weight-bearing as tolerated  Plan for one session with PT today, and if meeting goals, will plan for discharge this afternoon.   Patient to follow up in clinic with Dr. Wynelle Link in two weeks.   The PDMP database was reviewed today prior to any opioid medications being prescribed to this patient.  Fenton Foy, MBA, PA-C Orthopedic Surgery 07/31/2020, 7:16 AM

## 2020-07-31 NOTE — Progress Notes (Signed)
Physical Therapy Treatment Patient Details Name: [name] MRN: [id number] DOB: 09/06/1934 Today's Date: 07/31/2020    History of Present Illness Pt s/p L TKR revision (poly exchange)    PT Comments    Pt in good spirits and progressing well with mobility despite increased pain.  Spouse present for PT session including HEP, stairs and ambulation in hall.  Pt eager for dc home this date.   Follow Up Recommendations  Follow surgeon's recommendation for DC plan and follow-up therapies     Equipment Recommendations  None recommended by PT    Recommendations for Other Services       Precautions / Restrictions Precautions Precautions: Knee;Fall Restrictions Weight Bearing Restrictions: No Other Position/Activity Restrictions: WBAT    Mobility  Bed Mobility               General bed mobility comments: Pt up in chair and requests back to same    Transfers Overall transfer level: Needs assistance Equipment used: Rolling walker (2 wheeled) Transfers: Sit to/from Stand Sit to Stand: Min guard;Supervision         General transfer comment: min cues for LE management and use of UEs to self assist.  Ambulation/Gait Ambulation/Gait assistance: Supervision Gait Distance (Feet): 150 Feet Assistive device: Rolling walker (2 wheeled) Gait Pattern/deviations: Decreased step length - right;Decreased step length - left;Shuffle;Trunk flexed;Step-to pattern;Step-through pattern     General Gait Details: min cues for sequence, posture and position from RW   Stairs Stairs: Yes Stairs assistance: Min assist Stair Management: No rails;Step to pattern;Forwards;With walker Number of Stairs: 4 General stair comments: 2 steps twice with RW at top and use of door frame on L to self assist.  descent with RW only.  CUes for sequence and foot/RW placement.  Spouse assisting on second attempt   Wheelchair Mobility    Modified Rankin (Stroke Patients Only)       Balance  Overall balance assessment: Needs assistance Sitting-balance support: No upper extremity supported;Feet supported Sitting balance-Leahy Scale: Good     Standing balance support: No upper extremity supported Standing balance-Leahy Scale: Fair                              Cognition Arousal/Alertness: Awake/alert Behavior During Therapy: WFL for tasks assessed/performed;Impulsive Overall Cognitive Status: Within Functional Limits for tasks assessed                                        Exercises Total Joint Exercises Ankle Circles/Pumps: AROM;Both;20 reps;Supine Quad Sets: AROM;Both;10 reps;Supine Heel Slides: AAROM;Left;15 reps;Supine Straight Leg Raises: AAROM;AROM;Left;15 reps;Supine Goniometric ROM: AAROM L knee -5 - 45    General Comments        Pertinent Vitals/Pain Pain Assessment: 0-10 Pain Score: 5  Pain Location: L knee Pain Descriptors / Indicators: Aching;Sore;Grimacing Pain Intervention(s): Limited activity within patient's tolerance;Monitored during session;Premedicated before session;Ice applied    Home Living                      Prior Function            PT Goals (current goals can now be found in the care plan section) Acute Rehab PT Goals Patient Stated Goal: Regain IND PT Goal Formulation: With patient Time For Goal Achievement: 08/06/20 Potential to Achieve Goals: Good Progress towards PT goals: Progressing  toward goals    Frequency    7X/week      PT Plan Current plan remains appropriate    Co-evaluation              AM-PAC PT "6 Clicks" Mobility   Outcome Measure  Help needed turning from your back to your side while in a flat bed without using bedrails?: A Little Help needed moving from lying on your back to sitting on the side of a flat bed without using bedrails?: A Little Help needed moving to and from a bed to a chair (including a wheelchair)?: A Little Help needed standing up from  a chair using your arms (e.g., wheelchair or bedside chair)?: A Little Help needed to walk in hospital room?: A Little Help needed climbing 3-5 steps with a railing? : A Little 6 Click Score: 18    End of Session Equipment Utilized During Treatment: Gait belt Activity Tolerance: Patient tolerated treatment well Patient left: in chair;with call bell/phone within reach;with chair alarm set;with family/visitor present Nurse Communication: Mobility status PT Visit Diagnosis: Difficulty in walking, not elsewhere classified (R26.2)     Time: 6568-1275 PT Time Calculation (min) (ACUTE ONLY): 38 min  Charges:  $Gait Training: 8-22 mins $Therapeutic Exercise: 8-22 mins $Therapeutic Activity: 8-22 mins                     [facility] Pager [phone] Office [phone]    Aitanna Haubner 07/31/2020, 1:41 PM

## 2020-07-31 NOTE — Progress Notes (Signed)
Patient discharged to home w/ family. Given all belongings, instructions. Verbalized understanding of instructions. Escorted to pov via w/c. 

## 2020-08-03 ENCOUNTER — Other Ambulatory Visit (HOSPITAL_COMMUNITY): Payer: Self-pay

## 2020-08-03 DIAGNOSIS — M25562 Pain in left knee: Secondary | ICD-10-CM | POA: Diagnosis not present

## 2020-08-03 DIAGNOSIS — M25662 Stiffness of left knee, not elsewhere classified: Secondary | ICD-10-CM | POA: Diagnosis not present

## 2020-08-05 NOTE — Discharge Summary (Signed)
Physician Discharge Summary   Patient ID: [name] MRN: [id number] DOB/AGE: September 13, 1934 86 y.o.  Admit date: 07/29/2020 Discharge date: 07/31/2020  Primary Diagnosis:  Unstable left total knee arthroplasty  Admission Diagnoses:  Past Medical History:  Diagnosis Date  . Allergy   . Arthritis   . [facility])    prostate CA 1997, skin CA  . Cataract   . Colon polyp   . Dysrhythmia    PACs  . GERD (gastroesophageal reflux disease)   . History of colon polyps    adenoma; GI  . History of prostate cancer   . History of skin cancer   . HTN (hypertension)   . Other and unspecified hyperlipidemia   . PAC (premature atrial contraction)   . Sleep apnea    uses CPAP   Discharge Diagnoses:   Principal Problem:   Failed total knee arthroplasty ([facility]) Active Problems:   Failed total left knee replacement ([facility])  Estimated body mass index is 27.26 kg/m as calculated from the following:   Height as of this encounter: 5\' 10"  (1.778 m).   Weight as of this encounter: 86.2 kg.  Procedure:  Procedure(s) (LRB): LEFT KNEE REVISION, POLYETHYLENE EXCHANGE (Left)   Consults: None  HPI: The patient is an 86 year old male who had a total knee arthroplasty done many years ago.  He recently has developed recurrent effusions.  He has had an infection workup, which was negative.  Bone scan was also negative for any  loosening.  He has developed some instability, presumed secondary to polyethylene wear.  He presents now for polyethylene versus total knee revision.  Laboratory Data: Admission on 07/29/2020, Discharged on 07/31/2020  Component Date Value Ref Range Status  . WBC 07/30/2020 13.9* 4.0 - 10.5 K/uL Final  . RBC 07/30/2020 3.97* 4.22 - 5.81 MIL/uL Final  . Hemoglobin 07/30/2020 12.2* 13.0 - 17.0 g/dL Final  . HCT 07/30/2020 36.4* 39.0 - 52.0 % Final  . MCV 07/30/2020 91.7  80.0 - 100.0 fL Final  . MCH 07/30/2020 30.7  26.0 - 34.0 pg Final  . MCHC 07/30/2020 33.5  30.0 -  36.0 g/dL Final  . RDW 07/30/2020 13.2  11.5 - 15.5 % Final  . Platelets 07/30/2020 198  150 - 400 K/uL Final  . nRBC 07/30/2020 0.0  0.0 - 0.2 % Final   Performed at [facility], [place] [address] [place], [place] [address]  . Sodium 07/30/2020 138  135 - 145 mmol/L Final  . Potassium 07/30/2020 4.1  3.5 - 5.1 mmol/L Final  . Chloride 07/30/2020 105  98 - 111 mmol/L Final  . CO2 07/30/2020 25  22 - 32 mmol/L Final  . Glucose, Bld 07/30/2020 117* 70 - 99 mg/dL Final   Glucose reference range applies only to samples taken after fasting for at least 8 hours.  . BUN 07/30/2020 12  8 - 23 mg/dL Final  . Creatinine, Ser 07/30/2020 0.63  0.61 - 1.24 mg/dL Final  . Calcium 07/30/2020 8.8* 8.9 - 10.3 mg/dL Final  . GFR, Estimated 07/30/2020 >60  >60 mL/min Final   Comment: (NOTE) Calculated using the CKD-EPI Creatinine Equation (2021)   . Anion gap 07/30/2020 8  5 - 15 Final   Performed at [facility], [place] [address], [place], [place] [address]  . WBC 07/31/2020 12.1* 4.0 - 10.5 K/uL Final  . RBC 07/31/2020 3.60* 4.22 - 5.81 MIL/uL Final  . Hemoglobin 07/31/2020 11.1* 13.0 - 17.0 g/dL Final  .  HCT 07/31/2020 32.8* 39.0 - 52.0 % Final  . MCV 07/31/2020 91.1  80.0 - 100.0 fL Final  . MCH 07/31/2020 30.8  26.0 - 34.0 pg Final  . MCHC 07/31/2020 33.8  30.0 - 36.0 g/dL Final  . RDW 07/31/2020 13.3  11.5 - 15.5 % Final  . Platelets 07/31/2020 199  150 - 400 K/uL Final  . nRBC 07/31/2020 0.0  0.0 - 0.2 % Final   Performed at [facility], [place] [address], [place], [place] [address]  . Sodium 07/31/2020 138  135 - 145 mmol/L Final  . Potassium 07/31/2020 3.5  3.5 - 5.1 mmol/L Final  . Chloride 07/31/2020 104  98 - 111 mmol/L Final  . CO2 07/31/2020 26  22 - 32 mmol/L Final  . Glucose, Bld 07/31/2020 96  70 - 99 mg/dL Final   Glucose reference range applies only to samples taken after fasting for at least 8 hours.  . BUN 07/31/2020 12  8 -  23 mg/dL Final  . Creatinine, Ser 07/31/2020 0.61  0.61 - 1.24 mg/dL Final  . Calcium 07/31/2020 8.5* 8.9 - 10.3 mg/dL Final  . GFR, Estimated 07/31/2020 >60  >60 mL/min Final   Comment: (NOTE) Calculated using the CKD-EPI Creatinine Equation (2021)   . Anion gap 07/31/2020 8  5 - 15 Final   Performed at [facility], [place] [address] [place], [place] [address]  Hospital Outpatient Visit on 07/27/2020  Component Date Value Ref Range Status  . SARS Coronavirus 2 07/27/2020 NEGATIVE  NEGATIVE Final   Comment: (NOTE) SARS-CoV-2 target nucleic acids are NOT DETECTED.  The SARS-CoV-2 RNA is generally detectable in upper and lower respiratory specimens during the acute phase of infection. Negative results do not preclude SARS-CoV-2 infection, do not rule out co-infections with other pathogens, and should not be used as the sole basis for treatment or other patient management decisions. Negative results must be combined with clinical observations, patient history, and epidemiological information. The expected result is Negative.  Fact Sheet for Patients: SugarRoll.be  Fact Sheet for Healthcare Providers: https://www.woods-mathews.com/  This test is not yet approved or cleared by the [place] FDA and  has been authorized for detection and/or diagnosis of SARS-CoV-2 by FDA under an Emergency Use Authorization (EUA). This EUA will remain  in effect (meaning this test can be used) for the duration of the COVID-19 declaration under Se                          ction 564(b)(1) of the Act, 21 U.S.C. section 360bbb-3(b)(1), unless the authorization is terminated or revoked sooner.  Performed at [facility], [place] [address] [place], [place] [address]   Hospital Outpatient Visit on 07/22/2020  Component Date Value Ref Range Status  . WBC 07/22/2020 6.4  4.0 - 10.5 K/uL Final  . RBC 07/22/2020 4.78  4.22 - 5.81 MIL/uL  Final  . Hemoglobin 07/22/2020 14.5  13.0 - 17.0 g/dL Final  . HCT 07/22/2020 44.1  39.0 - 52.0 % Final  . MCV 07/22/2020 92.3  80.0 - 100.0 fL Final  . MCH 07/22/2020 30.3  26.0 - 34.0 pg Final  . MCHC 07/22/2020 32.9  30.0 - 36.0 g/dL Final  . RDW 07/22/2020 13.2  11.5 - 15.5 % Final  . Platelets 07/22/2020 233  150 - 400 K/uL Final  . nRBC 07/22/2020 0.0  0.0 - 0.2 % Final   Performed at [facility]  [place] [address] [place], [place] [address]  . Sodium 07/22/2020 141  135 - 145 mmol/L Final  . Potassium 07/22/2020 4.4  3.5 - 5.1 mmol/L Final  . Chloride 07/22/2020 107  98 - 111 mmol/L Final  . CO2 07/22/2020 27  22 - 32 mmol/L Final  . Glucose, Bld 07/22/2020 104* 70 - 99 mg/dL Final   Glucose reference range applies only to samples taken after fasting for at least 8 hours.  . BUN 07/22/2020 15  8 - 23 mg/dL Final  . Creatinine, Ser 07/22/2020 0.58* 0.61 - 1.24 mg/dL Final  . Calcium 07/22/2020 9.4  8.9 - 10.3 mg/dL Final  . Total Protein 07/22/2020 7.0  6.5 - 8.1 g/dL Final  . Albumin 07/22/2020 4.3  3.5 - 5.0 g/dL Final  . AST 07/22/2020 35  15 - 41 U/L Final  . ALT 07/22/2020 38  0 - 44 U/L Final  . Alkaline Phosphatase 07/22/2020 80  38 - 126 U/L Final  . Total Bilirubin 07/22/2020 0.8  0.3 - 1.2 mg/dL Final  . GFR, Estimated 07/22/2020 >60  >60 mL/min Final   Comment: (NOTE) Calculated using the CKD-EPI Creatinine Equation (2021)   . Anion gap 07/22/2020 7  5 - 15 Final   Performed at [facility], [place] [address] [place], [place] [address]  . Prothrombin Time 07/22/2020 13.0  11.4 - 15.2 seconds Final  . INR 07/22/2020 1.0  0.8 - 1.2 Final   Comment: (NOTE) INR goal varies based on device and disease states. Performed at [facility], [place] [address], [place], [place] [address]   . aPTT 07/22/2020 28  24 - 36 seconds Final   Performed at [facility], [place] [address] [place], [place] [address]   . ABO/RH(D) 07/22/2020 O POS   Final  . Antibody Screen 07/22/2020 NEG   Final  . Sample Expiration 07/22/2020 08/01/2020,2359   Final  . Extend sample reason 07/22/2020    Final                   Value:NO TRANSFUSIONS OR PREGNANCY IN THE PAST 3 MONTHS Performed at [facility], [place] [address], [place], [place] [address]   . MRSA, PCR 07/22/2020 NEGATIVE  NEGATIVE Final  . Staphylococcus aureus 07/22/2020 NEGATIVE  NEGATIVE Final   Comment: (NOTE) The Xpert SA Assay (FDA approved for NASAL specimens in patients 104 years of age and older), is one component of a comprehensive surveillance program. It is not intended to diagnose infection nor to guide or monitor treatment. Performed at [facility], [place] [address] [place], [place] [address]      X-Rays:No results found.  EKG: Orders placed or performed during the hospital encounter of 05/04/20  . EKG 12 lead per protocol  . EKG 12 lead per protocol     Hospital Course: [name] is a 86 y.o. who was admitted to [facility]. They were brought to the operating room on 07/29/2020 and underwent Procedure(s): LEFT KNEE REVISION, POLYETHYLENE EXCHANGE.  Patient tolerated the procedure well and was later transferred to the recovery room and then to the orthopaedic floor for postoperative care. They were given PO and IV analgesics for pain control following their surgery. They were given 24 hours of postoperative antibiotics of  Anti-infectives (From admission, onward)   Start     Dose/Rate Route Frequency Ordered Stop   07/29/20 1800  ceFAZolin (ANCEF) IVPB  2g/100 mL premix        2 g 200 mL/hr over 30 Minutes Intravenous Every 6 hours 07/29/20 1516 07/30/20 0116   07/29/20 0930  ceFAZolin (ANCEF) IVPB 2g/100 mL premix        2 g 200 mL/hr over 30 Minutes Intravenous On call to O.R. 07/29/20 0102 07/29/20 1222     and started on DVT prophylaxis in the form of Xarelto and TED hose.   PT  and OT were ordered for total joint protocol. Discharge planning consulted to help with postop disposition and equipment needs. Patient had an uneventful night on the evening of surgery. They started to get up OOB with therapy on 07/30/20. Patient had excessive bleeding through bandage during PT. New dressing was reapplied without difficulty, with no sign of active bleeding present. Advised patient to continue compression wrap with ACE bandage for the next two days.  Continued to work with therapy into POD #2. Pt was seen during rounds on day two and was ready to go home pending progress with therapy. He was discharged to home later that day in stable condition.  Diet: Regular diet Activity: WBAT Follow-up: in two weeks Disposition: Home Discharged Condition: good   Discharge Instructions    Call MD / Call 911   Complete by: As directed    If you experience chest pain or shortness of breath, CALL 911 and be transported to the hospital emergency room.  If you develope a fever above 101 F, pus (white drainage) or increased drainage or redness at the wound, or calf pain, call your surgeon's office.   Change dressing   Complete by: As directed    You may remove the bulky bandage (ACE wrap and gauze) two days after surgery. You will have an adhesive waterproof bandage underneath. Leave this in place until your first follow-up appointment.   Constipation Prevention   Complete by: As directed    Drink plenty of fluids.  Prune juice may be helpful.  You may use a stool softener, such as Colace (over the counter) 100 mg twice a day.  Use MiraLax (over the counter) for constipation as needed.   Diet - low sodium heart healthy   Complete by: As directed    Do not put a pillow under the knee. Place it under the heel.   Complete by: As directed    Driving restrictions   Complete by: As directed    No driving for two weeks   Post-operative opioid taper instructions:   Complete by: As directed     POST-OPERATIVE OPIOID TAPER INSTRUCTIONS: It is important to wean off of your opioid medication as soon as possible. If you do not need pain medication after your surgery it is ok to stop day one. Opioids include: Codeine, Hydrocodone(Norco, Vicodin), Oxycodone(Percocet, oxycontin) and hydromorphone amongst others.  Long term and even short term use of opiods can cause: Increased pain response Dependence Constipation Depression Respiratory depression And more.  Withdrawal symptoms can include Flu like symptoms Nausea, vomiting And more Techniques to manage these symptoms Hydrate well Eat regular healthy meals Stay active Use relaxation techniques(deep breathing, meditating, yoga) Do Not substitute Alcohol to help with tapering If you have been on opioids for less than two weeks and do not have pain than it is ok to stop all together.  Plan to wean off of opioids This plan should start within one week post op of your joint replacement. Maintain the same interval or time between taking each  dose and first decrease the dose.  Cut the total daily intake of opioids by one tablet each day Next start to increase the time between doses. The last dose that should be eliminated is the evening dose.      TED hose   Complete by: As directed    Use stockings (TED hose) for three weeks on both leg(s).  You may remove them at night for sleeping.   Weight bearing as tolerated   Complete by: As directed      Allergies as of 07/31/2020      Reactions   Cefdinir Nausea Only   Per PCP, causes upset stomach      Medication List    STOP taking these medications   Glucosamine 750 MG Tabs   meloxicam 15 MG tablet Commonly known as: MOBIC   multivitamin tablet     TAKE these medications   amLODipine 10 MG tablet Commonly known as: NORVASC Take 1 tablet (10 mg total) by mouth daily. What changed: when to take this   atorvastatin 20 MG tablet Commonly known as: LIPITOR TAKE ONE AND  ONE-HALF TABLETS DAILY What changed:   how much to take  how to take this  when to take this   benazepril 40 MG tablet Commonly known as: LOTENSIN Take 1 tablet (40 mg total) by mouth daily. What changed: when to take this   cetirizine 10 MG tablet Commonly known as: ZYRTEC Take 10 mg by mouth daily.   fluticasone 50 MCG/ACT nasal spray Commonly known as: FLONASE Place 1 spray into both nostrils daily as needed for allergies or rhinitis.   Melatonin 10 MG Tabs Take 10 mg by mouth at bedtime as needed (sleep).   methocarbamol 500 MG tablet Commonly known as: ROBAXIN Take 1 tablet (500 mg total) by mouth every 6 (six) hours as needed for muscle spasms.   metoprolol tartrate 50 MG tablet Commonly known as: LOPRESSOR 1/2 by mouth two times daily What changed:   how much to take  how to take this  when to take this  additional instructions   oxyCODONE 5 MG immediate release tablet Commonly known as: Oxy IR/ROXICODONE Take 1-2 tablets (5-10 mg total) by mouth every 6 (six) hours as needed for severe pain.   traMADol 50 MG tablet Commonly known as: ULTRAM Take 1-2 tablets (50-100 mg total) by mouth every 6 (six) hours as needed for moderate pain.   Xarelto 10 MG Tabs tablet Generic drug: rivaroxaban Take 1 tablet (10 mg total) by mouth daily with breakfast.            Discharge Care Instructions  (From admission, onward)         Start     Ordered   07/30/20 0000  Weight bearing as tolerated        07/30/20 0759   07/30/20 0000  Change dressing       Comments: You may remove the bulky bandage (ACE wrap and gauze) two days after surgery. You will have an adhesive waterproof bandage underneath. Leave this in place until your first follow-up appointment.   07/30/20 0759          Follow-up Information    Gaynelle Arabian, MD. Schedule an appointment as soon as possible for a visit on 08/11/2020.   Specialty: Orthopedic Surgery Contact information: [address] [place] [address] [phone]               Signed: Fenton Foy, MBA,  PA-C Orthopedic Surgery 08/05/2020, 12:17 PM

## 2020-08-10 DIAGNOSIS — M25562 Pain in left knee: Secondary | ICD-10-CM | POA: Diagnosis not present

## 2020-08-10 DIAGNOSIS — M25662 Stiffness of left knee, not elsewhere classified: Secondary | ICD-10-CM | POA: Diagnosis not present

## 2020-08-14 DIAGNOSIS — M25562 Pain in left knee: Secondary | ICD-10-CM | POA: Diagnosis not present

## 2020-08-14 DIAGNOSIS — M25662 Stiffness of left knee, not elsewhere classified: Secondary | ICD-10-CM | POA: Diagnosis not present

## 2020-08-17 DIAGNOSIS — M25662 Stiffness of left knee, not elsewhere classified: Secondary | ICD-10-CM | POA: Diagnosis not present

## 2020-08-17 DIAGNOSIS — M25562 Pain in left knee: Secondary | ICD-10-CM | POA: Diagnosis not present

## 2020-08-20 DIAGNOSIS — M25662 Stiffness of left knee, not elsewhere classified: Secondary | ICD-10-CM | POA: Diagnosis not present

## 2020-08-20 DIAGNOSIS — M25562 Pain in left knee: Secondary | ICD-10-CM | POA: Diagnosis not present

## 2020-08-26 ENCOUNTER — Other Ambulatory Visit (HOSPITAL_COMMUNITY): Payer: Self-pay

## 2020-08-26 DIAGNOSIS — M25662 Stiffness of left knee, not elsewhere classified: Secondary | ICD-10-CM | POA: Diagnosis not present

## 2020-08-26 DIAGNOSIS — M25562 Pain in left knee: Secondary | ICD-10-CM | POA: Diagnosis not present

## 2020-09-01 DIAGNOSIS — M25562 Pain in left knee: Secondary | ICD-10-CM | POA: Diagnosis not present

## 2020-09-01 DIAGNOSIS — Z4789 Encounter for other orthopedic aftercare: Secondary | ICD-10-CM | POA: Diagnosis not present

## 2020-09-01 DIAGNOSIS — M25662 Stiffness of left knee, not elsewhere classified: Secondary | ICD-10-CM | POA: Diagnosis not present

## 2020-09-03 ENCOUNTER — Other Ambulatory Visit (HOSPITAL_COMMUNITY): Payer: Self-pay

## 2020-09-09 DIAGNOSIS — Z125 Encounter for screening for malignant neoplasm of prostate: Secondary | ICD-10-CM | POA: Diagnosis not present

## 2020-09-09 DIAGNOSIS — E785 Hyperlipidemia, unspecified: Secondary | ICD-10-CM | POA: Diagnosis not present

## 2020-09-16 DIAGNOSIS — Z23 Encounter for immunization: Secondary | ICD-10-CM | POA: Diagnosis not present

## 2020-09-16 DIAGNOSIS — Z0000 Encounter for general adult medical examination without abnormal findings: Secondary | ICD-10-CM | POA: Diagnosis not present

## 2020-09-16 DIAGNOSIS — R82998 Other abnormal findings in urine: Secondary | ICD-10-CM | POA: Diagnosis not present

## 2020-09-16 DIAGNOSIS — M129 Arthropathy, unspecified: Secondary | ICD-10-CM | POA: Diagnosis not present

## 2020-09-16 DIAGNOSIS — E785 Hyperlipidemia, unspecified: Secondary | ICD-10-CM | POA: Diagnosis not present

## 2020-09-16 DIAGNOSIS — K219 Gastro-esophageal reflux disease without esophagitis: Secondary | ICD-10-CM | POA: Diagnosis not present

## 2020-09-16 DIAGNOSIS — I10 Essential (primary) hypertension: Secondary | ICD-10-CM | POA: Diagnosis not present

## 2020-09-16 DIAGNOSIS — K5730 Diverticulosis of large intestine without perforation or abscess without bleeding: Secondary | ICD-10-CM | POA: Diagnosis not present

## 2020-09-16 DIAGNOSIS — K635 Polyp of colon: Secondary | ICD-10-CM | POA: Diagnosis not present

## 2020-09-16 DIAGNOSIS — Z8546 Personal history of malignant neoplasm of prostate: Secondary | ICD-10-CM | POA: Diagnosis not present

## 2020-09-16 DIAGNOSIS — I499 Cardiac arrhythmia, unspecified: Secondary | ICD-10-CM | POA: Diagnosis not present

## 2020-09-16 DIAGNOSIS — G4733 Obstructive sleep apnea (adult) (pediatric): Secondary | ICD-10-CM | POA: Diagnosis not present

## 2020-09-28 DIAGNOSIS — G4733 Obstructive sleep apnea (adult) (pediatric): Secondary | ICD-10-CM | POA: Diagnosis not present

## 2020-11-03 DIAGNOSIS — G4733 Obstructive sleep apnea (adult) (pediatric): Secondary | ICD-10-CM | POA: Diagnosis not present

## 2020-11-03 DIAGNOSIS — J309 Allergic rhinitis, unspecified: Secondary | ICD-10-CM | POA: Diagnosis not present

## 2020-11-03 DIAGNOSIS — J0100 Acute maxillary sinusitis, unspecified: Secondary | ICD-10-CM | POA: Diagnosis not present

## 2020-11-25 ENCOUNTER — Other Ambulatory Visit (HOSPITAL_COMMUNITY): Payer: Self-pay

## 2020-11-26 ENCOUNTER — Other Ambulatory Visit (HOSPITAL_COMMUNITY): Payer: Self-pay

## 2020-12-03 DIAGNOSIS — R0981 Nasal congestion: Secondary | ICD-10-CM | POA: Diagnosis not present

## 2020-12-03 DIAGNOSIS — J029 Acute pharyngitis, unspecified: Secondary | ICD-10-CM | POA: Diagnosis not present

## 2020-12-03 DIAGNOSIS — R5383 Other fatigue: Secondary | ICD-10-CM | POA: Diagnosis not present

## 2020-12-03 DIAGNOSIS — J018 Other acute sinusitis: Secondary | ICD-10-CM | POA: Diagnosis not present

## 2020-12-14 DIAGNOSIS — S51011A Laceration without foreign body of right elbow, initial encounter: Secondary | ICD-10-CM | POA: Diagnosis not present

## 2020-12-14 DIAGNOSIS — W19XXXA Unspecified fall, initial encounter: Secondary | ICD-10-CM | POA: Diagnosis not present

## 2020-12-17 DIAGNOSIS — S80812A Abrasion, left lower leg, initial encounter: Secondary | ICD-10-CM | POA: Diagnosis not present

## 2020-12-17 DIAGNOSIS — W06XXXA Fall from bed, initial encounter: Secondary | ICD-10-CM | POA: Diagnosis not present

## 2020-12-17 DIAGNOSIS — S0101XA Laceration without foreign body of scalp, initial encounter: Secondary | ICD-10-CM | POA: Diagnosis not present

## 2020-12-17 DIAGNOSIS — S50311A Abrasion of right elbow, initial encounter: Secondary | ICD-10-CM | POA: Diagnosis not present

## 2020-12-22 ENCOUNTER — Other Ambulatory Visit (HOSPITAL_COMMUNITY): Payer: Self-pay

## 2020-12-22 DIAGNOSIS — G4733 Obstructive sleep apnea (adult) (pediatric): Secondary | ICD-10-CM | POA: Diagnosis not present

## 2020-12-30 ENCOUNTER — Other Ambulatory Visit (HOSPITAL_COMMUNITY): Payer: Self-pay

## 2020-12-30 DIAGNOSIS — D1801 Hemangioma of skin and subcutaneous tissue: Secondary | ICD-10-CM | POA: Diagnosis not present

## 2020-12-30 DIAGNOSIS — L812 Freckles: Secondary | ICD-10-CM | POA: Diagnosis not present

## 2020-12-30 DIAGNOSIS — L570 Actinic keratosis: Secondary | ICD-10-CM | POA: Diagnosis not present

## 2020-12-30 DIAGNOSIS — L821 Other seborrheic keratosis: Secondary | ICD-10-CM | POA: Diagnosis not present

## 2020-12-30 DIAGNOSIS — L304 Erythema intertrigo: Secondary | ICD-10-CM | POA: Diagnosis not present

## 2021-01-09 DIAGNOSIS — Z23 Encounter for immunization: Secondary | ICD-10-CM | POA: Diagnosis not present

## 2021-03-02 DIAGNOSIS — M5451 Vertebrogenic low back pain: Secondary | ICD-10-CM | POA: Diagnosis not present

## 2021-03-03 DIAGNOSIS — Z961 Presence of intraocular lens: Secondary | ICD-10-CM | POA: Diagnosis not present

## 2021-03-03 DIAGNOSIS — H5203 Hypermetropia, bilateral: Secondary | ICD-10-CM | POA: Diagnosis not present

## 2021-03-18 DIAGNOSIS — C61 Malignant neoplasm of prostate: Secondary | ICD-10-CM | POA: Diagnosis not present

## 2021-03-18 DIAGNOSIS — N393 Stress incontinence (female) (male): Secondary | ICD-10-CM | POA: Diagnosis not present

## 2021-03-18 DIAGNOSIS — N529 Male erectile dysfunction, unspecified: Secondary | ICD-10-CM | POA: Diagnosis not present

## 2021-03-22 DIAGNOSIS — M5451 Vertebrogenic low back pain: Secondary | ICD-10-CM | POA: Diagnosis not present

## 2021-03-22 DIAGNOSIS — G4733 Obstructive sleep apnea (adult) (pediatric): Secondary | ICD-10-CM | POA: Diagnosis not present

## 2021-04-12 DIAGNOSIS — I10 Essential (primary) hypertension: Secondary | ICD-10-CM | POA: Diagnosis not present

## 2021-04-12 DIAGNOSIS — J018 Other acute sinusitis: Secondary | ICD-10-CM | POA: Diagnosis not present

## 2021-04-12 DIAGNOSIS — Z1152 Encounter for screening for COVID-19: Secondary | ICD-10-CM | POA: Diagnosis not present

## 2021-04-12 DIAGNOSIS — R5383 Other fatigue: Secondary | ICD-10-CM | POA: Diagnosis not present

## 2021-04-12 DIAGNOSIS — J029 Acute pharyngitis, unspecified: Secondary | ICD-10-CM | POA: Diagnosis not present

## 2021-04-12 DIAGNOSIS — J309 Allergic rhinitis, unspecified: Secondary | ICD-10-CM | POA: Diagnosis not present

## 2021-04-13 ENCOUNTER — Telehealth: Payer: Self-pay | Admitting: Adult Health

## 2021-04-13 NOTE — Telephone Encounter (Signed)
LVM on home and cell #s and mychart msg sent informing pt of reschedule needed for 2/20 appt, Hedwig Morton out.

## 2021-04-15 DIAGNOSIS — N529 Male erectile dysfunction, unspecified: Secondary | ICD-10-CM | POA: Diagnosis not present

## 2021-04-15 DIAGNOSIS — N393 Stress incontinence (female) (male): Secondary | ICD-10-CM | POA: Diagnosis not present

## 2021-04-15 DIAGNOSIS — C61 Malignant neoplasm of prostate: Secondary | ICD-10-CM | POA: Diagnosis not present

## 2021-04-22 DIAGNOSIS — J029 Acute pharyngitis, unspecified: Secondary | ICD-10-CM | POA: Diagnosis not present

## 2021-04-22 DIAGNOSIS — G4733 Obstructive sleep apnea (adult) (pediatric): Secondary | ICD-10-CM | POA: Diagnosis not present

## 2021-04-22 DIAGNOSIS — J018 Other acute sinusitis: Secondary | ICD-10-CM | POA: Diagnosis not present

## 2021-04-22 DIAGNOSIS — J309 Allergic rhinitis, unspecified: Secondary | ICD-10-CM | POA: Diagnosis not present

## 2021-05-14 DIAGNOSIS — N393 Stress incontinence (female) (male): Secondary | ICD-10-CM | POA: Diagnosis not present

## 2021-05-14 DIAGNOSIS — N5231 Erectile dysfunction following radical prostatectomy: Secondary | ICD-10-CM | POA: Diagnosis not present

## 2021-05-14 DIAGNOSIS — Z8546 Personal history of malignant neoplasm of prostate: Secondary | ICD-10-CM | POA: Diagnosis not present

## 2021-05-14 DIAGNOSIS — R82998 Other abnormal findings in urine: Secondary | ICD-10-CM | POA: Diagnosis not present

## 2021-05-14 DIAGNOSIS — R3129 Other microscopic hematuria: Secondary | ICD-10-CM | POA: Diagnosis not present

## 2021-05-18 ENCOUNTER — Telehealth: Payer: Self-pay | Admitting: Adult Health

## 2021-05-18 NOTE — Telephone Encounter (Signed)
Spoke with pt and answered his questions. He will bring his machine and power cord tomorrow. He states he has had his machine over 5 yrs and would like to discuss getting a new one.

## 2021-05-18 NOTE — Telephone Encounter (Signed)
Pt would like a call back to discuss why no data for CPAP machine. Will bring machine to appt.

## 2021-05-19 ENCOUNTER — Encounter: Payer: Self-pay | Admitting: Adult Health

## 2021-05-19 ENCOUNTER — Other Ambulatory Visit: Payer: Self-pay

## 2021-05-19 ENCOUNTER — Ambulatory Visit: Payer: Medicare HMO | Admitting: Adult Health

## 2021-05-19 VITALS — BP 117/72 | HR 46 | Ht 70.0 in | Wt 188.8 lb

## 2021-05-19 DIAGNOSIS — Z9989 Dependence on other enabling machines and devices: Secondary | ICD-10-CM

## 2021-05-19 DIAGNOSIS — G4733 Obstructive sleep apnea (adult) (pediatric): Secondary | ICD-10-CM | POA: Diagnosis not present

## 2021-05-19 NOTE — Progress Notes (Signed)
PATIENT: [name] DOB: 1935-01-02  REASON FOR VISIT: follow up HISTORY FROM: patient PRIMARY NEUROLOGIST: Dr. Rexene Alberts  HISTORY OF PRESENT ILLNESS: Today 05/19/21:  [name] is a an 87 year old male with a history of obstructive sleep apnea on CPAP.  He returns today for follow-up.  He reports that the CPAP is working well for him.  He states that he is due for new machine and would like his replaced.  He returns today for evaluation.      REVIEW OF SYSTEMS: Out of a complete 14 system review of symptoms, the patient complains only of the following symptoms, and all other reviewed systems are negative.   ESS 2  ALLERGIES: Allergies  Allergen Reactions   Cefdinir Nausea Only    Per PCP, causes upset stomach    HOME MEDICATIONS: Outpatient Medications Prior to Visit  Medication Sig Dispense Refill   amLODipine (NORVASC) 10 MG tablet Take 1 tablet (10 mg total) by mouth daily. (Patient taking differently: Take 10 mg by mouth at bedtime.) 90 tablet 3   atorvastatin (LIPITOR) 20 MG tablet TAKE ONE AND ONE-HALF TABLETS DAILY (Patient taking differently: Take 30 mg by mouth every morning. TAKE ONE AND ONE-HALF TABLETS DAILY) 135 tablet 3   benazepril (LOTENSIN) 40 MG tablet Take 1 tablet (40 mg total) by mouth daily. (Patient taking differently: Take 40 mg by mouth at bedtime.) 90 tablet 3   cetirizine (ZYRTEC) 10 MG tablet Take 10 mg by mouth daily.     fluticasone (FLONASE) 50 MCG/ACT nasal spray Place 1 spray into both nostrils daily as needed for allergies or rhinitis.     Melatonin 10 MG TABS Take 10 mg by mouth at bedtime as needed (sleep).     metoprolol (LOPRESSOR) 50 MG tablet 1/2 by mouth two times daily (Patient taking differently: Take 25 mg by mouth 2 (two) times daily.) 90 tablet 3   methocarbamol (ROBAXIN) 500 MG tablet Take 1 tablet (500 mg total) by mouth every 6 (six) hours as needed for muscle spasms. 40 tablet 0   oxyCODONE (OXY IR/ROXICODONE) 5 MG immediate  release tablet Take 1-2 tablets (5-10 mg total) by mouth every 6 (six) hours as needed for severe pain. 42 tablet 0   rivaroxaban (XARELTO) 10 MG TABS tablet Take 1 tablet (10 mg total) by mouth daily with breakfast. 20 tablet 0   traMADol (ULTRAM) 50 MG tablet Take 1-2 tablets (50-100 mg total) by mouth every 6 (six) hours as needed for moderate pain. 40 tablet 0   No facility-administered medications prior to visit.    PAST MEDICAL HISTORY: Past Medical History:  Diagnosis Date   Allergy    Arthritis    Cancer ([place])    prostate CA 1997, skin CA   Cataract    Colon polyp    Dysrhythmia    PACs   GERD (gastroesophageal reflux disease)    History of colon polyps    adenoma;[facility]   History of prostate cancer    History of skin cancer    HTN (hypertension)    Other and unspecified hyperlipidemia    PAC (premature atrial contraction)    Sleep apnea    uses CPAP    PAST SURGICAL HISTORY: Past Surgical History:  Procedure Laterality Date   COLONOSCOPY     COLONOSCOPY W/ POLYPECTOMY  last 2013    X 2; [facility]   EYE SURGERY Bilateral 2021   HEMORROIDECTOMY     INSERTION OF MESH N/A  05/04/2020   Procedure: INSERTION OF MESH;  Surgeon: Coralie Keens, MD;  Location: [place];  Service: General;  Laterality: N/A;   PROSTATE SURGERY     PROSTATECTOMY  1997   Dr Davis   [place]      X 3(L twice); Dr Theda Sers   TOTAL KNEE ARTHROPLASTY     L; Dr Maureen Ralphs   TOTAL KNEE REVISION Left 07/29/2020   Procedure: LEFT KNEE REVISION, POLYETHYLENE EXCHANGE;  Surgeon: Gaynelle Arabian, MD;  Location: [facility];  Service: Orthopedics;  Laterality: Left;   UMBILICAL HERNIA REPAIR N/A 05/04/2020   Procedure: UMBILICAL HERNIA REPAIR WITH MESH;  Surgeon: Coralie Keens, MD;  Location: [place];  Service: General;  Laterality: N/A;  LMA    FAMILY HISTORY: Family History  Problem Relation Age of Onset   Stroke Mother 15   Heart disease Father 74        ?MI   Heart disease Brother        CBAG in 60s   Dementia Brother        vascular dementia   Colon cancer Neg Hx    Stomach cancer Neg Hx    Diabetes Neg Hx    Cancer Neg Hx    Esophageal cancer Neg Hx    Rectal cancer Neg Hx    Sleep apnea Neg Hx     SOCIAL HISTORY: Social History   Socioeconomic History   Marital status: Married    Spouse name: Not on file   Number of children: 3   Years of education: BA   Highest education level: Not on file  Occupational History   Occupation: Retired  Tobacco Use   Smoking status: Never   Smokeless tobacco: Never  Vaping Use   Vaping Use: Never used  Substance and Sexual Activity   Alcohol use: Yes    Alcohol/week: 14.0 standard drinks    Types: 14 Shots of liquor per week    Comment:  14 drinks /week   Drug use: No   Sexual activity: Not on file  Other Topics Concern   Not on file  Social History Narrative   Drinks 2 cups of coffee a day    Social Determinants of Health   Financial Resource Strain: Not on file  Food Insecurity: Not on file  Transportation Needs: Not on file  Physical Activity: Not on file  Stress: Not on file  Social Connections: Not on file  Intimate Partner Violence: Not on file      PHYSICAL EXAM  Vitals:   05/19/21 1425  BP: 117/72  Pulse: (!) 46  Weight: 188 lb 12.8 oz (85.6 kg)  Height: $Remove'5\' 10"'nzuajas$  (1.778 m)   Body mass index is 27.09 kg/m.  Generalized: Well developed, in no acute distress  Chest: Lungs clear to auscultation bilaterally  Neurological examination  Mentation: Alert oriented to time, place, history taking. Follows all commands speech and language fluent Cranial nerve II-XII: Extraocular movements were full, visual field were full on confrontational test Head turning and shoulder shrug  were normal and symmetric. Motor: The motor testing reveals 5 over 5 strength of all 4 extremities. Good symmetric motor tone is noted throughout.  Sensory: Sensory testing is intact  to soft touch on all 4 extremities. No evidence of extinction is noted.  Gait and station: Gait is normal.    DIAGNOSTIC DATA (LABS, IMAGING, TESTING) - I reviewed patient records, labs, notes, testing and imaging myself where available.  Lab  Results  Component Value Date   WBC 12.1 (H) 07/31/2020   HGB 11.1 (L) 07/31/2020   HCT 32.8 (L) 07/31/2020   MCV 91.1 07/31/2020   PLT 199 07/31/2020      Component Value Date/Time   NA 138 07/31/2020 0321   K 3.5 07/31/2020 0321   CL 104 07/31/2020 0321   CO2 26 07/31/2020 0321   GLUCOSE 96 07/31/2020 0321   GLUCOSE 101 (H) 02/07/2006 0821   BUN 12 07/31/2020 0321   CREATININE 0.61 07/31/2020 0321   CALCIUM 8.5 (L) 07/31/2020 0321   PROT 7.0 07/22/2020 0947   ALBUMIN 4.3 07/22/2020 0947   AST 35 07/22/2020 0947   ALT 38 07/22/2020 0947   ALKPHOS 80 07/22/2020 0947   BILITOT 0.8 07/22/2020 0947   GFRNONAA >60 07/31/2020 0321   GFRAA  05/19/2010 0432    >60        The eGFR has been calculated using the MDRD equation. This calculation has not been validated in all clinical situations. eGFR's persistently <60 mL/min signify possible Chronic Kidney Disease.   Lab Results  Component Value Date   CHOL 204 (H) 05/22/2013   HDL 74.10 05/22/2013   LDLCALC 114 (H) 05/11/2012   LDLDIRECT 113.8 05/22/2013   TRIG 77.0 05/22/2013   CHOLHDL 3 05/22/2013   Lab Results  Component Value Date   HGBA1C 5.9 10/09/2006   No results found for: VITAMINB12 Lab Results  Component Value Date   TSH 1.76 05/22/2013      ASSESSMENT AND PLAN 88 y.o. year old male  has a past medical history of Allergy, Arthritis, Cancer ([place]), Cataract, Colon polyp, Dysrhythmia, GERD (gastroesophageal reflux disease), History of colon polyps, History of prostate cancer, History of skin cancer, HTN (hypertension), Other and unspecified hyperlipidemia, PAC (premature atrial contraction), and Sleep apnea. here with:  OSA on CPAP  - CPAP compliance  excellent - Good treatment of AHI  - Encourage patient to use CPAP nightly and > 4 hours each night -Order sent for new machine-patient wants to change DME companies to [facility] - F/U in 1 year or sooner if needed   Ward Givens, MSN, NP-C 05/19/2021, 2:32 PM [facility] [address] [place], [place] [address] [phone]

## 2021-05-19 NOTE — Patient Instructions (Signed)
Your Plan:  Continue using CPAP nightly and greater than 4 hours each night Order sent for new machine If your symptoms worsen or you develop new symptoms please let us know.    Thank you for coming to see [place] at [facility]. I hope we have been able to provide you high quality care today.  You may receive a patient satisfaction survey over the next few weeks. We would appreciate your feedback and comments so that we may continue to improve ourselves and the health of our patients.

## 2021-05-20 DIAGNOSIS — N481 Balanitis: Secondary | ICD-10-CM | POA: Diagnosis not present

## 2021-05-23 DIAGNOSIS — G4733 Obstructive sleep apnea (adult) (pediatric): Secondary | ICD-10-CM | POA: Diagnosis not present

## 2021-05-24 ENCOUNTER — Ambulatory Visit: Payer: Medicare HMO | Admitting: Adult Health

## 2021-05-26 DIAGNOSIS — Z8546 Personal history of malignant neoplasm of prostate: Secondary | ICD-10-CM | POA: Diagnosis not present

## 2021-05-26 DIAGNOSIS — N39492 Postural (urinary) incontinence: Secondary | ICD-10-CM | POA: Diagnosis not present

## 2021-05-26 DIAGNOSIS — G4730 Sleep apnea, unspecified: Secondary | ICD-10-CM | POA: Diagnosis not present

## 2021-05-26 DIAGNOSIS — N393 Stress incontinence (female) (male): Secondary | ICD-10-CM | POA: Diagnosis not present

## 2021-05-26 DIAGNOSIS — Z9079 Acquired absence of other genital organ(s): Secondary | ICD-10-CM | POA: Diagnosis not present

## 2021-05-26 DIAGNOSIS — Z9889 Other specified postprocedural states: Secondary | ICD-10-CM | POA: Diagnosis not present

## 2021-05-26 DIAGNOSIS — I10 Essential (primary) hypertension: Secondary | ICD-10-CM | POA: Diagnosis not present

## 2021-05-27 DIAGNOSIS — G4730 Sleep apnea, unspecified: Secondary | ICD-10-CM | POA: Diagnosis not present

## 2021-05-27 DIAGNOSIS — I10 Essential (primary) hypertension: Secondary | ICD-10-CM | POA: Diagnosis not present

## 2021-05-27 DIAGNOSIS — N393 Stress incontinence (female) (male): Secondary | ICD-10-CM | POA: Diagnosis not present

## 2021-05-27 DIAGNOSIS — Z8546 Personal history of malignant neoplasm of prostate: Secondary | ICD-10-CM | POA: Diagnosis not present

## 2021-05-27 DIAGNOSIS — Z9889 Other specified postprocedural states: Secondary | ICD-10-CM | POA: Diagnosis not present

## 2021-06-03 DIAGNOSIS — G4733 Obstructive sleep apnea (adult) (pediatric): Secondary | ICD-10-CM | POA: Diagnosis not present

## 2021-06-24 DIAGNOSIS — G4733 Obstructive sleep apnea (adult) (pediatric): Secondary | ICD-10-CM | POA: Diagnosis not present

## 2021-06-25 DIAGNOSIS — N393 Stress incontinence (female) (male): Secondary | ICD-10-CM | POA: Diagnosis not present

## 2021-07-04 DIAGNOSIS — G4733 Obstructive sleep apnea (adult) (pediatric): Secondary | ICD-10-CM | POA: Diagnosis not present

## 2021-07-08 DIAGNOSIS — L821 Other seborrheic keratosis: Secondary | ICD-10-CM | POA: Diagnosis not present

## 2021-07-08 DIAGNOSIS — L570 Actinic keratosis: Secondary | ICD-10-CM | POA: Diagnosis not present

## 2021-07-08 DIAGNOSIS — L853 Xerosis cutis: Secondary | ICD-10-CM | POA: Diagnosis not present

## 2021-07-08 DIAGNOSIS — L812 Freckles: Secondary | ICD-10-CM | POA: Diagnosis not present

## 2021-07-08 DIAGNOSIS — L304 Erythema intertrigo: Secondary | ICD-10-CM | POA: Diagnosis not present

## 2021-07-29 ENCOUNTER — Ambulatory Visit: Payer: Medicare HMO | Admitting: Adult Health

## 2021-07-29 DIAGNOSIS — J329 Chronic sinusitis, unspecified: Secondary | ICD-10-CM | POA: Diagnosis not present

## 2021-07-29 DIAGNOSIS — H9193 Unspecified hearing loss, bilateral: Secondary | ICD-10-CM | POA: Diagnosis not present

## 2021-07-30 ENCOUNTER — Ambulatory Visit: Payer: Medicare HMO | Admitting: Adult Health

## 2021-07-30 ENCOUNTER — Encounter: Payer: Self-pay | Admitting: Adult Health

## 2021-07-30 VITALS — BP 113/70 | HR 70 | Ht 70.0 in | Wt 191.8 lb

## 2021-07-30 DIAGNOSIS — G4733 Obstructive sleep apnea (adult) (pediatric): Secondary | ICD-10-CM | POA: Diagnosis not present

## 2021-07-30 DIAGNOSIS — Z9989 Dependence on other enabling machines and devices: Secondary | ICD-10-CM

## 2021-07-30 NOTE — Progress Notes (Signed)
? ? ?PATIENT: [name] ?DOB: [birth year] ? ?REASON FOR VISIT: follow up ?HISTORY FROM: patient ?PRIMARY NEUROLOGIST: Dr. Rexene Alberts ? ?Chief Complaint  ?Patient presents with  ? OSA on CPAP  ?  Rm 5  CPAP FU  "no problems with CPAP, using nasal pillows- didn't like full mask"   ? ? ? ?HISTORY OF PRESENT ILLNESS: ? ?[name] is an 87 year old male with a history of obstructive sleep apnea on CPAP.  He returns today for CPAP download after receiving a new machine.  His download is below.  Reports that he is using the nasal pillows.  Did not like the fullface.   ? ?Today 07/30/21: ? ? ? ?05/19/21: [name] is a an 87 year old male with a history of obstructive sleep apnea on CPAP.  He returns today for follow-up.  He reports that the CPAP is working well for him.  He states that he is due for new machine and would like his replaced.  He returns today for evaluation. ? ? ? ? ? ?REVIEW OF SYSTEMS: Out of a complete 14 system review of symptoms, the patient complains only of the following symptoms, and all other reviewed systems are negative. ? ? ?ESS 2 ?FSS 14  ? ?ALLERGIES: ?Allergies  ?Allergen Reactions  ? Cefdinir Nausea Only  ?  Per PCP, causes upset stomach  ? Neosporin Plus Max St Rash  ? ? ?HOME MEDICATIONS: ?Outpatient Medications Prior to Visit  ?Medication Sig Dispense Refill  ? amLODipine (NORVASC) 10 MG tablet Take 1 tablet (10 mg total) by mouth daily. (Patient taking differently: Take 10 mg by mouth at bedtime.) 90 tablet 3  ? atorvastatin (LIPITOR) 20 MG tablet TAKE ONE AND ONE-HALF TABLETS DAILY (Patient taking differently: Take 30 mg by mouth every morning. TAKE ONE AND ONE-HALF TABLETS DAILY) 135 tablet 3  ? benazepril (LOTENSIN) 40 MG tablet Take 1 tablet (40 mg total) by mouth daily. (Patient taking differently: Take 40 mg by mouth at bedtime.) 90 tablet 3  ? cetirizine (ZYRTEC) 10 MG tablet Take 10 mg by mouth daily.    ? fluticasone (FLONASE) 50 MCG/ACT nasal spray Place 1 spray into both nostrils  daily as needed for allergies or rhinitis.    ? Melatonin 10 MG TABS Take 10 mg by mouth at bedtime as needed (sleep).    ? metoprolol (LOPRESSOR) 50 MG tablet 1/2 by mouth two times daily (Patient taking differently: Take 25 mg by mouth 2 (two) times daily.) 90 tablet 3  ? methocarbamol (ROBAXIN) 500 MG tablet Take 1 tablet (500 mg total) by mouth every 6 (six) hours as needed for muscle spasms. 40 tablet 0  ? oxyCODONE (OXY IR/ROXICODONE) 5 MG immediate release tablet Take 1-2 tablets (5-10 mg total) by mouth every 6 (six) hours as needed for severe pain. 42 tablet 0  ? rivaroxaban (XARELTO) 10 MG TABS tablet Take 1 tablet (10 mg total) by mouth daily with breakfast. 20 tablet 0  ? traMADol (ULTRAM) 50 MG tablet Take 1-2 tablets (50-100 mg total) by mouth every 6 (six) hours as needed for moderate pain. 40 tablet 0  ? ?No facility-administered medications prior to visit.  ? ? ?PAST MEDICAL HISTORY: ?Past Medical History:  ?Diagnosis Date  ? Allergy   ? Arthritis   ? Cancer [facility])   ? prostate CA 1997, skin CA  ? Cataract   ? Colon polyp   ? Dysrhythmia   ? PACs  ? GERD (gastroesophageal reflux disease)   ? History  of colon polyps   ? adenoma;[facility]  ? History of prostate cancer   ? History of skin cancer   ? HTN (hypertension)   ? Other and unspecified hyperlipidemia   ? PAC (premature atrial contraction)   ? Sleep apnea   ? uses CPAP  ? ? ?PAST SURGICAL HISTORY: ?Past Surgical History:  ?Procedure Laterality Date  ? COLONOSCOPY    ? COLONOSCOPY W/ POLYPECTOMY  last 2013  ?  X 2; [facility]  ? EYE SURGERY Bilateral 2021  ? HEMORROIDECTOMY    ? INSERTION OF MESH N/A 05/04/2020  ? Procedure: INSERTION OF MESH;  Surgeon: Coralie Keens, MD;  Location: [place];  Service: General;  Laterality: N/A;  ? PROSTATE SURGERY    ? PROSTATECTOMY  1997  ? Dr Rosana Hoes  ? ROTATOR CUFF REPAIR    ?  X 3(L twice); Dr Theda Sers  ? TOTAL KNEE ARTHROPLASTY    ? L; Dr Maureen Ralphs  ? TOTAL KNEE REVISION Left 07/29/2020  ?  Procedure: LEFT KNEE REVISION, POLYETHYLENE EXCHANGE;  Surgeon: Gaynelle Arabian, MD;  [facility];  Service: Orthopedics;  Laterality: Left;  ? UMBILICAL HERNIA REPAIR N/A 05/04/2020  ? Procedure: UMBILICAL HERNIA REPAIR WITH MESH;  Surgeon: Coralie Keens, MD;  Location: [place];  Service: General;  Laterality: N/A;  LMA  ? ? ?FAMILY HISTORY: ?Family History  ?Problem Relation Age of Onset  ? Stroke Mother 71  ? Heart disease Father 10  ?     ?MI  ? Heart disease Brother   ?     CBAG in 55s  ? Dementia Brother   ?     vascular dementia  ? Colon cancer Neg Hx   ? Stomach cancer Neg Hx   ? Diabetes Neg Hx   ? Cancer Neg Hx   ? Esophageal cancer Neg Hx   ? Rectal cancer Neg Hx   ? Sleep apnea Neg Hx   ? ? ?SOCIAL HISTORY: ?Social History  ? ?Socioeconomic History  ? Marital status: Married  ?  Spouse name: Not on file  ? Number of children: 3  ? Years of education: BA  ? Highest education level: Not on file  ?Occupational History  ? Occupation: Retired  ?Tobacco Use  ? Smoking status: Never  ? Smokeless tobacco: Never  ?Vaping Use  ? Vaping Use: Never used  ?Substance and Sexual Activity  ? Alcohol use: Yes  ?  Alcohol/week: 14.0 standard drinks  ?  Types: 14 Shots of liquor per week  ?  Comment:  14 drinks /week  ? Drug use: No  ? Sexual activity: Not on file  ?Other Topics Concern  ? Not on file  ?Social History Narrative  ? Drinks 2 cups of coffee a day   ? ?Social Determinants of Health  ? ?Financial Resource Strain: Not on file  ?Food Insecurity: Not on file  ?Transportation Needs: Not on file  ?Physical Activity: Not on file  ?Stress: Not on file  ?Social Connections: Not on file  ?Intimate Partner Violence: Not on file  ? ? ? ? ?PHYSICAL EXAM ? ?Vitals:  ? 07/30/21 0923  ?BP: 113/70  ?Pulse: 70  ?Weight: 191 lb 12.8 oz (87 kg)  ?Height: $RemoveB'5\' 10"'IQSORhqS$  (1.778 m)  ? ?Body mass index is 27.52 kg/m?. ? ?Generalized: Well developed, in no acute distress  ?Chest: Lungs clear to auscultation  bilaterally ? ?Neurological examination  ?Mentation: Alert oriented to time, place, history taking. Follows  all commands speech and language fluent ?Cranial nerve II-XII: Extraocular movements were full, visual field were full on confrontational test Head turning and shoulder shrug  were normal and symmetric. ?Motor: The motor testing reveals 5 over 5 strength of all 4 extremities. Good symmetric motor tone is noted throughout.  ?Sensory: Sensory testing is intact to soft touch on all 4 extremities. No evidence of extinction is noted.  ?Gait and station: Gait is normal.  ? ? ?DIAGNOSTIC DATA (LABS, IMAGING, TESTING) ?- I reviewed patient records, labs, notes, testing and imaging myself where available. ? ?Lab Results  ?Component Value Date  ? WBC 12.1 (H) 07/31/2020  ? HGB 11.1 (L) 07/31/2020  ? HCT 32.8 (L) 07/31/2020  ? MCV 91.1 07/31/2020  ? PLT 199 07/31/2020  ? ?   ?Component Value Date/Time  ? NA 138 07/31/2020 0321  ? K 3.5 07/31/2020 0321  ? CL 104 07/31/2020 0321  ? CO2 26 07/31/2020 0321  ? GLUCOSE 96 07/31/2020 0321  ? GLUCOSE 101 (H) 02/07/2006 4098  ? BUN 12 07/31/2020 0321  ? CREATININE 0.61 07/31/2020 0321  ? CALCIUM 8.5 (L) 07/31/2020 0321  ? PROT 7.0 07/22/2020 0947  ? ALBUMIN 4.3 07/22/2020 0947  ? AST 35 07/22/2020 0947  ? ALT 38 07/22/2020 0947  ? ALKPHOS 80 07/22/2020 0947  ? BILITOT 0.8 07/22/2020 0947  ? GFRNONAA >60 07/31/2020 0321  ? GFRAA  05/19/2010 0432  ?  >60        ?The eGFR has been calculated ?using the MDRD equation. ?This calculation has not been ?validated in all clinical ?situations. ?eGFR's persistently ?<60 mL/min signify ?possible Chronic Kidney Disease.  ? ?Lab Results  ?Component Value Date  ? CHOL 204 (H) 05/22/2013  ? HDL 74.10 05/22/2013  ? LDLCALC 114 (H) 05/11/2012  ? LDLDIRECT 113.8 05/22/2013  ? TRIG 77.0 05/22/2013  ? CHOLHDL 3 05/22/2013  ? ?Lab Results  ?Component Value Date  ? HGBA1C 5.9 10/09/2006  ? ?No results found for: VITAMINB12 ?Lab Results  ?Component  Value Date  ? TSH 1.76 05/22/2013  ? ? ? ? ?ASSESSMENT AND PLAN ?87 y.o. year old male  has a past medical history of Allergy, Arthritis, Cancer ([place]), Cataract, Colon polyp, Dysrhythmia, GERD (gastroesophageal reflux dise

## 2021-07-30 NOTE — Patient Instructions (Signed)
Continue using CPAP nightly and greater than 4 hours each night °If your symptoms worsen or you develop new symptoms please let us know.  ° °

## 2021-08-03 DIAGNOSIS — G4733 Obstructive sleep apnea (adult) (pediatric): Secondary | ICD-10-CM | POA: Diagnosis not present

## 2021-08-10 DIAGNOSIS — N393 Stress incontinence (female) (male): Secondary | ICD-10-CM | POA: Diagnosis not present

## 2021-08-10 DIAGNOSIS — R3129 Other microscopic hematuria: Secondary | ICD-10-CM | POA: Diagnosis not present

## 2021-09-03 DIAGNOSIS — G4733 Obstructive sleep apnea (adult) (pediatric): Secondary | ICD-10-CM | POA: Diagnosis not present

## 2021-09-07 DIAGNOSIS — H903 Sensorineural hearing loss, bilateral: Secondary | ICD-10-CM | POA: Diagnosis not present

## 2021-09-08 ENCOUNTER — Ambulatory Visit (INDEPENDENT_AMBULATORY_CARE_PROVIDER_SITE_OTHER): Payer: Medicare HMO

## 2021-09-08 ENCOUNTER — Encounter: Payer: Self-pay | Admitting: Podiatry

## 2021-09-08 ENCOUNTER — Ambulatory Visit: Payer: Medicare HMO | Admitting: Podiatry

## 2021-09-08 DIAGNOSIS — M21619 Bunion of unspecified foot: Secondary | ICD-10-CM

## 2021-09-08 DIAGNOSIS — M2142 Flat foot [pes planus] (acquired), left foot: Secondary | ICD-10-CM

## 2021-09-08 DIAGNOSIS — B351 Tinea unguium: Secondary | ICD-10-CM | POA: Diagnosis not present

## 2021-09-08 DIAGNOSIS — M2141 Flat foot [pes planus] (acquired), right foot: Secondary | ICD-10-CM

## 2021-09-08 NOTE — Progress Notes (Signed)
Subjective:   Patient ID: [name], male   DOB: 87 y.o.   MRN: [id number]   HPI Patient presents with flatfoot deformity bunion right and nail disease with yellowness that he is tried topical medicines on without effect.  States they do not bother him he is 87 years old as good mental status does not smoke likes to be active   Review of Systems  All other systems reviewed and are negative.      Objective:  Physical Exam Vitals and nursing note reviewed.  Constitutional:      Appearance: He is well-developed.  Pulmonary:     Effort: Pulmonary effort is normal.  Musculoskeletal:        General: Normal range of motion.  Skin:    General: Skin is warm.  Neurological:     Mental Status: He is alert.    Neurovascular status intact muscle strength found to be adequate range of motion found to be within normal limits.  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  Moderate flatfoot deformity also noted     Assessment:  Chronic foot issues with structure as the problem with nail disease which is probably more due to pressure against his nailbeds from the history of activity 8     Plan:  PE all conditions reviewed do not recommend treatment and nails currently discussed flatfoot recommended supportive shoes mesh materials that accommodate the bunion with no surgery at this current time x-rays indicate flatfoot deformity do indicate structural bunion deformity right moderate arthritis no other pathology

## 2021-10-03 DIAGNOSIS — G4733 Obstructive sleep apnea (adult) (pediatric): Secondary | ICD-10-CM | POA: Diagnosis not present

## 2021-10-21 DIAGNOSIS — N393 Stress incontinence (female) (male): Secondary | ICD-10-CM | POA: Diagnosis not present

## 2021-10-21 DIAGNOSIS — Z9079 Acquired absence of other genital organ(s): Secondary | ICD-10-CM | POA: Diagnosis not present

## 2021-10-21 DIAGNOSIS — N5231 Erectile dysfunction following radical prostatectomy: Secondary | ICD-10-CM | POA: Diagnosis not present

## 2021-10-21 DIAGNOSIS — Z8546 Personal history of malignant neoplasm of prostate: Secondary | ICD-10-CM | POA: Diagnosis not present

## 2021-10-27 DIAGNOSIS — Z125 Encounter for screening for malignant neoplasm of prostate: Secondary | ICD-10-CM | POA: Diagnosis not present

## 2021-10-27 DIAGNOSIS — R7989 Other specified abnormal findings of blood chemistry: Secondary | ICD-10-CM | POA: Diagnosis not present

## 2021-10-27 DIAGNOSIS — E785 Hyperlipidemia, unspecified: Secondary | ICD-10-CM | POA: Diagnosis not present

## 2021-11-03 DIAGNOSIS — E785 Hyperlipidemia, unspecified: Secondary | ICD-10-CM | POA: Diagnosis not present

## 2021-11-03 DIAGNOSIS — R82998 Other abnormal findings in urine: Secondary | ICD-10-CM | POA: Diagnosis not present

## 2021-11-03 DIAGNOSIS — G4733 Obstructive sleep apnea (adult) (pediatric): Secondary | ICD-10-CM | POA: Diagnosis not present

## 2021-11-03 DIAGNOSIS — M129 Arthropathy, unspecified: Secondary | ICD-10-CM | POA: Diagnosis not present

## 2021-11-03 DIAGNOSIS — Z23 Encounter for immunization: Secondary | ICD-10-CM | POA: Diagnosis not present

## 2021-11-03 DIAGNOSIS — K429 Umbilical hernia without obstruction or gangrene: Secondary | ICD-10-CM | POA: Diagnosis not present

## 2021-11-03 DIAGNOSIS — M1990 Unspecified osteoarthritis, unspecified site: Secondary | ICD-10-CM | POA: Diagnosis not present

## 2021-11-03 DIAGNOSIS — I10 Essential (primary) hypertension: Secondary | ICD-10-CM | POA: Diagnosis not present

## 2021-11-03 DIAGNOSIS — Z8546 Personal history of malignant neoplasm of prostate: Secondary | ICD-10-CM | POA: Diagnosis not present

## 2021-11-03 DIAGNOSIS — K219 Gastro-esophageal reflux disease without esophagitis: Secondary | ICD-10-CM | POA: Diagnosis not present

## 2021-11-03 DIAGNOSIS — Z0000 Encounter for general adult medical examination without abnormal findings: Secondary | ICD-10-CM | POA: Diagnosis not present

## 2021-11-03 DIAGNOSIS — G4700 Insomnia, unspecified: Secondary | ICD-10-CM | POA: Diagnosis not present

## 2021-11-11 DIAGNOSIS — Z96652 Presence of left artificial knee joint: Secondary | ICD-10-CM | POA: Diagnosis not present

## 2021-11-15 DIAGNOSIS — M5451 Vertebrogenic low back pain: Secondary | ICD-10-CM | POA: Diagnosis not present

## 2021-11-26 DIAGNOSIS — W260XXA Contact with knife, initial encounter: Secondary | ICD-10-CM | POA: Diagnosis not present

## 2021-11-26 DIAGNOSIS — S61211A Laceration without foreign body of left index finger without damage to nail, initial encounter: Secondary | ICD-10-CM | POA: Diagnosis not present

## 2021-12-02 ENCOUNTER — Telehealth: Payer: Self-pay | Admitting: *Deleted

## 2021-12-02 NOTE — Telephone Encounter (Signed)
DL looks good. Good treatment of apnea. Leak with the mask but its not affecting any numbers

## 2021-12-02 NOTE — Telephone Encounter (Signed)
Received by fax compliance report below.:

## 2021-12-03 DIAGNOSIS — Z4802 Encounter for removal of sutures: Secondary | ICD-10-CM | POA: Diagnosis not present

## 2021-12-03 DIAGNOSIS — S61211A Laceration without foreign body of left index finger without damage to nail, initial encounter: Secondary | ICD-10-CM | POA: Diagnosis not present

## 2021-12-04 DIAGNOSIS — G4733 Obstructive sleep apnea (adult) (pediatric): Secondary | ICD-10-CM | POA: Diagnosis not present

## 2021-12-23 DIAGNOSIS — G4733 Obstructive sleep apnea (adult) (pediatric): Secondary | ICD-10-CM | POA: Diagnosis not present

## 2022-01-03 DIAGNOSIS — G4733 Obstructive sleep apnea (adult) (pediatric): Secondary | ICD-10-CM | POA: Diagnosis not present

## 2022-01-05 DIAGNOSIS — R0981 Nasal congestion: Secondary | ICD-10-CM | POA: Diagnosis not present

## 2022-01-05 DIAGNOSIS — R5383 Other fatigue: Secondary | ICD-10-CM | POA: Diagnosis not present

## 2022-01-05 DIAGNOSIS — J029 Acute pharyngitis, unspecified: Secondary | ICD-10-CM | POA: Diagnosis not present

## 2022-01-05 DIAGNOSIS — Z1152 Encounter for screening for COVID-19: Secondary | ICD-10-CM | POA: Diagnosis not present

## 2022-01-05 DIAGNOSIS — I10 Essential (primary) hypertension: Secondary | ICD-10-CM | POA: Diagnosis not present

## 2022-01-10 DIAGNOSIS — L603 Nail dystrophy: Secondary | ICD-10-CM | POA: Diagnosis not present

## 2022-01-10 DIAGNOSIS — L821 Other seborrheic keratosis: Secondary | ICD-10-CM | POA: Diagnosis not present

## 2022-01-10 DIAGNOSIS — L723 Sebaceous cyst: Secondary | ICD-10-CM | POA: Diagnosis not present

## 2022-01-10 DIAGNOSIS — L853 Xerosis cutis: Secondary | ICD-10-CM | POA: Diagnosis not present

## 2022-01-10 DIAGNOSIS — L820 Inflamed seborrheic keratosis: Secondary | ICD-10-CM | POA: Diagnosis not present

## 2022-01-10 DIAGNOSIS — M5451 Vertebrogenic low back pain: Secondary | ICD-10-CM | POA: Diagnosis not present

## 2022-01-10 DIAGNOSIS — L304 Erythema intertrigo: Secondary | ICD-10-CM | POA: Diagnosis not present

## 2022-01-10 DIAGNOSIS — L812 Freckles: Secondary | ICD-10-CM | POA: Diagnosis not present

## 2022-01-10 DIAGNOSIS — L570 Actinic keratosis: Secondary | ICD-10-CM | POA: Diagnosis not present

## 2022-01-13 DIAGNOSIS — C61 Malignant neoplasm of prostate: Secondary | ICD-10-CM | POA: Diagnosis not present

## 2022-01-13 DIAGNOSIS — N529 Male erectile dysfunction, unspecified: Secondary | ICD-10-CM | POA: Diagnosis not present

## 2022-01-15 DIAGNOSIS — Z23 Encounter for immunization: Secondary | ICD-10-CM | POA: Diagnosis not present

## 2022-01-25 DIAGNOSIS — M5451 Vertebrogenic low back pain: Secondary | ICD-10-CM | POA: Diagnosis not present

## 2022-02-01 DIAGNOSIS — M5451 Vertebrogenic low back pain: Secondary | ICD-10-CM | POA: Diagnosis not present

## 2022-02-03 DIAGNOSIS — G4733 Obstructive sleep apnea (adult) (pediatric): Secondary | ICD-10-CM | POA: Diagnosis not present

## 2022-02-07 DIAGNOSIS — M545 Low back pain, unspecified: Secondary | ICD-10-CM | POA: Diagnosis not present

## 2022-02-22 DIAGNOSIS — M5451 Vertebrogenic low back pain: Secondary | ICD-10-CM | POA: Diagnosis not present

## 2022-02-23 DIAGNOSIS — Z1152 Encounter for screening for COVID-19: Secondary | ICD-10-CM | POA: Diagnosis not present

## 2022-02-23 DIAGNOSIS — I10 Essential (primary) hypertension: Secondary | ICD-10-CM | POA: Diagnosis not present

## 2022-02-23 DIAGNOSIS — J029 Acute pharyngitis, unspecified: Secondary | ICD-10-CM | POA: Diagnosis not present

## 2022-02-23 DIAGNOSIS — R0981 Nasal congestion: Secondary | ICD-10-CM | POA: Diagnosis not present

## 2022-02-23 DIAGNOSIS — R051 Acute cough: Secondary | ICD-10-CM | POA: Diagnosis not present

## 2022-02-23 DIAGNOSIS — J069 Acute upper respiratory infection, unspecified: Secondary | ICD-10-CM | POA: Diagnosis not present

## 2022-03-01 DIAGNOSIS — Z6826 Body mass index (BMI) 26.0-26.9, adult: Secondary | ICD-10-CM | POA: Diagnosis not present

## 2022-03-01 DIAGNOSIS — M4316 Spondylolisthesis, lumbar region: Secondary | ICD-10-CM | POA: Diagnosis not present

## 2022-03-04 ENCOUNTER — Other Ambulatory Visit: Payer: Self-pay | Admitting: Neurological Surgery

## 2022-03-05 DIAGNOSIS — G4733 Obstructive sleep apnea (adult) (pediatric): Secondary | ICD-10-CM | POA: Diagnosis not present

## 2022-03-16 DIAGNOSIS — Z961 Presence of intraocular lens: Secondary | ICD-10-CM | POA: Diagnosis not present

## 2022-03-16 DIAGNOSIS — H52203 Unspecified astigmatism, bilateral: Secondary | ICD-10-CM | POA: Diagnosis not present

## 2022-03-17 DIAGNOSIS — H52203 Unspecified astigmatism, bilateral: Secondary | ICD-10-CM | POA: Diagnosis not present

## 2022-03-18 DIAGNOSIS — M4316 Spondylolisthesis, lumbar region: Secondary | ICD-10-CM | POA: Diagnosis not present

## 2022-04-01 NOTE — Pre-Procedure Instructions (Signed)
Surgical Instructions    Your procedure is scheduled on Friday, January 19th.  Report to [facility] Main Entrance "A" at 08:50 A.M., then check in with the Admitting office.  Call this number if you have problems the morning of surgery:  [phone]  If you have any questions prior to your surgery date call [phone]: Open Monday-Friday 8am-4pm If you experience any cold or flu symptoms such as cough, fever, chills, shortness of breath, etc. between now and your scheduled surgery, please notify us at the above number.     Remember:  Do not eat or drink after midnight the night before your surgery    Take these medicines the morning of surgery with A SIP OF WATER  atorvastatin (LIPITOR)  cetirizine (ZYRTEC)  metoprolol (LOPRESSOR)   If needed: acetaminophen (TYLENOL)  fluticasone (FLONASE)  montelukast (SINGULAIR)    As of today, STOP taking any Aspirin (unless otherwise instructed by your surgeon) Aleve, Naproxen, Ibuprofen, Motrin, Advil, Goody's, BC's, all herbal medications, fish oil, and all vitamins. This includes meloxicam (MOBIC).                     Do NOT Smoke (Tobacco/Vaping) for 24 hours prior to your procedure.  If you use a CPAP at night, you may bring your mask/headgear for your overnight stay.   Contacts, glasses, piercing's, hearing aid's, dentures or partials may not be worn into surgery, please bring cases for these belongings.    For patients admitted to the hospital, discharge time will be determined by your treatment team.   Patients discharged the day of surgery will not be allowed to drive home, and someone needs to stay with them for 24 hours.  SURGICAL WAITING ROOM VISITATION Patients having surgery or a procedure may have no more than 2 support people in the waiting area - these visitors may rotate.   Children under the age of 17 must have an adult with them who is not the patient. If the patient needs to stay at the hospital during part of  their recovery, the visitor guidelines for inpatient rooms apply. Pre-op nurse will coordinate an appropriate time for 1 support person to accompany patient in pre-op.  This support person may not rotate.   Please refer to the [facility] website for the visitor guidelines for Inpatients (after your surgery is over and you are in a regular room).    Special instructions:   [place]- Preparing For Surgery  Before surgery, you can play an important role. Because skin is not sterile, your skin needs to be as free of germs as possible. You can reduce the number of germs on your skin by washing with CHG (chlorahexidine gluconate) Soap before surgery.  CHG is an antiseptic cleaner which kills germs and bonds with the skin to continue killing germs even after washing.    Oral Hygiene is also important to reduce your risk of infection.  Remember - BRUSH YOUR TEETH THE MORNING OF SURGERY WITH YOUR REGULAR TOOTHPASTE  Please do not use if you have an allergy to CHG or antibacterial soaps. If your skin becomes reddened/irritated stop using the CHG.  Do not shave (including legs and underarms) for at least 48 hours prior to first CHG shower. It is OK to shave your face.  Please follow these instructions carefully.   Shower the NIGHT BEFORE SURGERY and the MORNING OF SURGERY  If you chose to wash your hair, wash your hair first as usual with your normal  shampoo.  After you shampoo, rinse your hair and body thoroughly to remove the shampoo.  Use CHG Soap as you would any other liquid soap. You can apply CHG directly to the skin and wash gently with a scrungie or a clean washcloth.   Apply the CHG Soap to your body ONLY FROM THE NECK DOWN.  Do not use on open wounds or open sores. Avoid contact with your eyes, ears, mouth and genitals (private parts). Wash Face and genitals (private parts)  with your normal soap.   Wash thoroughly, paying special attention to the area where your surgery will be  performed.  Thoroughly rinse your body with warm water from the neck down.  DO NOT shower/wash with your normal soap after using and rinsing off the CHG Soap.  Pat yourself dry with a CLEAN TOWEL.  Wear CLEAN PAJAMAS to bed the night before surgery  Place CLEAN SHEETS on your bed the night before your surgery  DO NOT SLEEP WITH PETS.   Day of Surgery: Take a shower with CHG soap. Do not wear jewelry  Do not wear lotions, powders, colognes, or deodorant. Men may shave face and neck. Do not bring valuables to the hospital. [facility] is not responsible for any belongings or valuables.  Wear Clean/Comfortable clothing the morning of surgery Remember to brush your teeth WITH YOUR REGULAR TOOTHPASTE.   Please read over the following fact sheets that you were given.    If you received a COVID test during your pre-op visit  it is requested that you wear a mask when out in public, stay away from anyone that may not be feeling well and notify your surgeon if you develop symptoms. If you have been in contact with anyone that has tested positive in the last 10 days please notify you surgeon.

## 2022-04-05 ENCOUNTER — Encounter (HOSPITAL_COMMUNITY): Payer: Self-pay

## 2022-04-05 ENCOUNTER — Other Ambulatory Visit: Payer: Self-pay

## 2022-04-05 ENCOUNTER — Encounter (HOSPITAL_COMMUNITY)
Admission: RE | Admit: 2022-04-05 | Discharge: 2022-04-05 | Disposition: A | Discharge status: Home / Self Care | Payer: Medicare Other | Source: Ambulatory Visit | Attending: Neurological Surgery | Admitting: Neurological Surgery

## 2022-04-05 VITALS — BP 147/80 | HR 68 | Temp 97.3°F | Resp 18 | Ht 70.0 in | Wt 191.4 lb

## 2022-04-05 DIAGNOSIS — G8929 Other chronic pain: Secondary | ICD-10-CM | POA: Diagnosis not present

## 2022-04-05 DIAGNOSIS — M5442 Lumbago with sciatica, left side: Secondary | ICD-10-CM | POA: Diagnosis not present

## 2022-04-05 DIAGNOSIS — Z01818 Encounter for other preprocedural examination: Secondary | ICD-10-CM | POA: Insufficient documentation

## 2022-04-05 DIAGNOSIS — I498 Other specified cardiac arrhythmias: Secondary | ICD-10-CM | POA: Insufficient documentation

## 2022-04-05 HISTORY — DX: Myoneural disorder, unspecified: G70.9

## 2022-04-05 LAB — COMPREHENSIVE METABOLIC PANEL
ALT: 35 U/L (ref 0–44)
AST: 33 U/L (ref 15–41)
Albumin: 4.1 g/dL (ref 3.5–5.0)
Alkaline Phosphatase: 81 U/L (ref 38–126)
Anion gap: 6 (ref 5–15)
BUN: 13 mg/dL (ref 8–23)
CO2: 28 mmol/L (ref 22–32)
Calcium: 9.1 mg/dL (ref 8.9–10.3)
Chloride: 103 mmol/L (ref 98–111)
Creatinine, Ser: 0.69 mg/dL (ref 0.61–1.24)
GFR, Estimated: >60 mL/min (ref >60)
Glucose, Bld: 101 mg/dL — ABNORMAL HIGH (ref 70–99)
Potassium: 3.8 mmol/L (ref 3.5–5.1)
Sodium: 137 mmol/L (ref 135–145)
Total Bilirubin: 0.9 mg/dL (ref 0.3–1.2)
Total Protein: 6.9 g/dL (ref 6.5–8.1)

## 2022-04-05 LAB — PROTIME-INR
INR: 1 (ref 0.8–1.2)
Prothrombin Time: 13.2 seconds (ref 11.4–15.2)

## 2022-04-05 LAB — CBC
HCT: 42.3 % (ref 39.0–52.0)
Hemoglobin: 14.4 g/dL (ref 13.0–17.0)
MCH: 31.4 pg (ref 26.0–34.0)
MCHC: 34 g/dL (ref 30.0–36.0)
MCV: 92.4 fL (ref 80.0–100.0)
Platelets: 219 10*3/uL (ref 150–400)
RBC: 4.58 MIL/uL (ref 4.22–5.81)
RDW: 13.5 % (ref 11.5–15.5)
WBC: 6.4 10*3/uL (ref 4.0–10.5)
nRBC: 0 % (ref 0.0–0.2)

## 2022-04-05 LAB — SURGICAL PCR SCREEN
MRSA, PCR: NEGATIVE
Staphylococcus aureus: NEGATIVE

## 2022-04-05 NOTE — Progress Notes (Signed)
PCP - Dr. Alfonso Patten. [facility]  EKG - 04/05/22 Stress Test - 1998  Sleep Study - 2017 (in Epic) CPAP - yes     Anesthesia review: No  Patient denies shortness of breath, fever, cough and chest pain at PAT appointment   All instructions explained to the patient, with a verbal understanding of the material. Patient agrees to go over the instructions while at home for a better understanding. Patient also instructed to self quarantine after being tested for COVID-19. The opportunity to ask questions was provided.

## 2022-04-05 NOTE — Pre-Procedure Instructions (Addendum)
Surgical Instructions   Your procedure is scheduled on Friday, 04/22/2021 at 10:50 AM.   Report to [facility] Entrance "A" at 8:50 AM, then check in with the Admitting office.   Call this number if you have problems the morning of surgery:  [phone]  If you have any questions prior to your surgery date call [phone]: Open Monday-Friday 8am-4pm If you experience any cold or flu symptoms such as cough, fever, chills, shortness of breath, etc. between now and your scheduled surgery, please notify us at the above number.    Remember:  Do not eat or drink after midnight the night before your surgery   Take these medicines the morning of surgery with A SIP OF WATER: Atorvastatin (Lipitor), Cetirizine (Zyrtec), Metoprolol (Lopressor), Acetaminophen (Tylenol) - if needed, Montelukast (Singulair) - if needed, Fluticasone (Flonase) - if needed  As of today, STOP taking any Aspirin (unless otherwise instructed by your surgeon) Aleve, Naproxen, Ibuprofen, Motrin, Advil, Goody's, BC's, all herbal medications, fish oil, and all vitamins. This includes meloxicam (MOBIC) and Melatonin.  If you use a CPAP at night, you may bring your mask/headgear for your overnight stay.   Contacts, glasses, piercing's, hearing aid's, dentures or partials may not be worn into surgery, please bring cases for these belongings.    For patients admitted to the hospital, discharge time will be determined by your treatment team.   Patients discharged the day of surgery will not be allowed to drive home, and someone needs to stay with them for 24 hours.  SURGICAL WAITING ROOM VISITATION Patients having surgery or a procedure may have no more than 2 support people in the waiting area - these visitors may rotate.   Children under the age of 26 must have an adult with them who is not the patient. If the patient needs to stay at the hospital during part of their recovery, the visitor guidelines for inpatient rooms  apply. Pre-op nurse will coordinate an appropriate time for 1 support person to accompany patient in pre-op.  This support person may not rotate.   Please refer to the [facility] website for the visitor guidelines for Inpatients (after your surgery is over and you are in a regular room).   Special instructions:   [place]- Preparing For Surgery  Before surgery, you can play an important role. Because skin is not sterile, your skin needs to be as free of germs as possible. You can reduce the number of germs on your skin by washing with CHG (chlorahexidine gluconate) Soap before surgery.  CHG is an antiseptic cleaner which kills germs and bonds with the skin to continue killing germs even after washing.    Oral Hygiene is also important to reduce your risk of infection.  Remember - BRUSH YOUR TEETH THE MORNING OF SURGERY WITH YOUR REGULAR TOOTHPASTE  Please do not use if you have an allergy to CHG or antibacterial soaps. If your skin becomes reddened/irritated stop using the CHG.  Do not shave (including legs and underarms) for at least 48 hours prior to first CHG shower. It is OK to shave your face.  Please follow these instructions carefully.   Shower the NIGHT BEFORE SURGERY and the MORNING OF SURGERY  If you chose to wash your hair, wash your hair first as usual with your normal shampoo.  After you shampoo, rinse your hair and body thoroughly to remove the shampoo.  Use CHG Soap as you would any other liquid soap. You can apply CHG directly  to the skin and wash gently with a scrungie or a clean washcloth.   Apply the CHG Soap to your body ONLY FROM THE NECK DOWN.  Do not use on open wounds or open sores. Avoid contact with your eyes, ears, mouth and genitals (private parts). Wash Face and genitals (private parts)  with your normal soap.   Wash thoroughly, paying special attention to the area where your surgery will be performed.  Thoroughly rinse your body with warm water from the  neck down.  DO NOT shower/wash with your normal soap after using and rinsing off the CHG Soap.  Pat yourself dry with a CLEAN TOWEL.  Wear CLEAN PAJAMAS to bed the night before surgery  Place CLEAN SHEETS on your bed the night before your surgery  DO NOT SLEEP WITH PETS.  Day of Surgery: Take a shower with CHG soap. Do not wear jewelry  Do not wear lotions, powders, colognes, or deodorant. Men may shave face and neck. Do not bring valuables to the hospital. [facility] is not responsible for any belongings or valuables.  Wear Clean/Comfortable clothing the morning of surgery Remember to brush your teeth WITH YOUR REGULAR TOOTHPASTE.   Please read over the fact sheets that you were given.

## 2022-04-11 ENCOUNTER — Telehealth: Payer: Self-pay | Admitting: Adult Health

## 2022-04-11 DIAGNOSIS — G4733 Obstructive sleep apnea (adult) (pediatric): Secondary | ICD-10-CM

## 2022-04-11 NOTE — Telephone Encounter (Signed)
Note thanks. This is a duplicate call. Will address in other phone note.

## 2022-04-11 NOTE — Telephone Encounter (Signed)
New, Willodean Rosenthal, RN; Reola Calkins Eleonore Chiquito, Nona Dell, Mike Craze Received, Thank you!       Previous Messages    ----- Message ----- From: Brandon Melnick, RN Sent: 04/11/2022   5:04 PM EST To: Darlina Guys; Miquel Dunn; Nash Shearer; * Subject: supplies (change DME to [facility]) due *  [name], 88 y.o., [birth year] MRN: [id number] Phone: [phone]   Pt will need new supplies.  He had insurance change and had to change DME.    Thank you  [name]

## 2022-04-11 NOTE — Telephone Encounter (Signed)
Pt states with his new insurance the current DME: [facility] is not accepted by his insurance, he is asking for a call to discuss being changed to another DME

## 2022-04-11 NOTE — Telephone Encounter (Signed)
We received a duplicate message from another staff member in the office. Pt now has [place].

## 2022-04-11 NOTE — Telephone Encounter (Addendum)
I sent community message to [facility] for new DME, as pt change of insurance to [facility].  Order for new supplies.

## 2022-04-11 NOTE — Telephone Encounter (Signed)
I spoke to pt and he is no longer able to use [facility] has paid for his machine , but will need supplies.  [facility] is is not policy.  [facility] does not take [facility] M[facility].  [facility] is one that will most likely take his insurance.  He will bring his new cards.  Once received will send new order  for supplies to [employer].

## 2022-04-11 NOTE — Telephone Encounter (Signed)
Patient left a voicemail on my phone stating he changed his health insurance from [place] to [facility] and stated that [facility] is out of network with his [employer] and would like some helping getting someone that is in network with his insurance.

## 2022-04-11 NOTE — Telephone Encounter (Signed)
This is fine. He has to use a DME that his insurance covers

## 2022-04-11 NOTE — Addendum Note (Signed)
Addended by: Brandon Melnick on: 04/11/2022 05:05 PM   Modules accepted: Orders

## 2022-04-22 ENCOUNTER — Other Ambulatory Visit: Payer: Self-pay

## 2022-04-22 ENCOUNTER — Ambulatory Visit (HOSPITAL_COMMUNITY): Payer: Medicare Other

## 2022-04-22 ENCOUNTER — Ambulatory Visit (HOSPITAL_COMMUNITY): Payer: Medicare Other | Admitting: Registered Nurse

## 2022-04-22 ENCOUNTER — Observation Stay (HOSPITAL_COMMUNITY)
Admission: RE | Admit: 2022-04-22 | Discharge: 2022-04-23 | Disposition: A | Discharge status: Home / Self Care | Payer: Medicare Other | Attending: Neurological Surgery | Admitting: Neurological Surgery

## 2022-04-22 ENCOUNTER — Ambulatory Visit (HOSPITAL_BASED_OUTPATIENT_CLINIC_OR_DEPARTMENT_OTHER): Payer: Medicare Other | Admitting: Registered Nurse

## 2022-04-22 ENCOUNTER — Encounter (HOSPITAL_COMMUNITY): Payer: Self-pay | Admitting: Neurological Surgery

## 2022-04-22 ENCOUNTER — Encounter (HOSPITAL_COMMUNITY)
Admission: RE | Disposition: A | Discharge status: Home / Self Care | Payer: Self-pay | Source: Home / Self Care | Attending: Neurological Surgery

## 2022-04-22 DIAGNOSIS — M5416 Radiculopathy, lumbar region: Secondary | ICD-10-CM | POA: Insufficient documentation

## 2022-04-22 DIAGNOSIS — M4316 Spondylolisthesis, lumbar region: Secondary | ICD-10-CM

## 2022-04-22 DIAGNOSIS — G8929 Other chronic pain: Secondary | ICD-10-CM

## 2022-04-22 DIAGNOSIS — M48062 Spinal stenosis, lumbar region with neurogenic claudication: Secondary | ICD-10-CM | POA: Diagnosis not present

## 2022-04-22 DIAGNOSIS — I10 Essential (primary) hypertension: Secondary | ICD-10-CM

## 2022-04-22 DIAGNOSIS — Z85828 Personal history of other malignant neoplasm of skin: Secondary | ICD-10-CM | POA: Diagnosis not present

## 2022-04-22 DIAGNOSIS — Z8546 Personal history of malignant neoplasm of prostate: Secondary | ICD-10-CM | POA: Insufficient documentation

## 2022-04-22 DIAGNOSIS — Z8616 Personal history of COVID-19: Secondary | ICD-10-CM | POA: Insufficient documentation

## 2022-04-22 DIAGNOSIS — Z79899 Other long term (current) drug therapy: Secondary | ICD-10-CM | POA: Diagnosis not present

## 2022-04-22 DIAGNOSIS — Z01818 Encounter for other preprocedural examination: Secondary | ICD-10-CM

## 2022-04-22 DIAGNOSIS — E785 Hyperlipidemia, unspecified: Secondary | ICD-10-CM

## 2022-04-22 DIAGNOSIS — Z96652 Presence of left artificial knee joint: Secondary | ICD-10-CM | POA: Diagnosis not present

## 2022-04-22 DIAGNOSIS — Z981 Arthrodesis status: Secondary | ICD-10-CM

## 2022-04-22 HISTORY — PX: LAMINECTOMY WITH POSTERIOR LATERAL ARTHRODESIS LEVEL 2: SHX6336

## 2022-04-22 LAB — TYPE AND SCREEN
ABO/RH(D): O POS
Antibody Screen: NEGATIVE

## 2022-04-22 SURGERY — LAMINECTOMY WITH POSTERIOR LATERAL ARTHRODESIS LEVEL 2
Anesthesia: General | Site: Back | Laterality: Bilateral

## 2022-04-22 MED ORDER — THROMBIN 5000 UNITS EX SOLR
CUTANEOUS | Status: AC
  Filled 2022-04-22: qty 5000

## 2022-04-22 MED ORDER — THROMBIN 20000 UNITS EX SOLR
CUTANEOUS | Status: DC | PRN
  Administered 2022-04-22: 20 mL via TOPICAL

## 2022-04-22 MED ORDER — 0.9 % SODIUM CHLORIDE (POUR BTL) OPTIME
TOPICAL | Status: DC | PRN
  Administered 2022-04-22: 1000 mL

## 2022-04-22 MED ORDER — SENNA 8.6 MG PO TABS
1.0000 | ORAL_TABLET | Freq: Two times a day (BID) | ORAL | Status: DC
  Administered 2022-04-22: 8.6 mg via ORAL
  Filled 2022-04-22 (×2): qty 1

## 2022-04-22 MED ORDER — DEXMEDETOMIDINE HCL IN NACL 80 MCG/20ML IV SOLN
INTRAVENOUS | Status: DC | PRN
  Administered 2022-04-22: 4 ug via BUCCAL
  Administered 2022-04-22: 8 ug via BUCCAL
  Administered 2022-04-22 (×2): 4 ug via BUCCAL

## 2022-04-22 MED ORDER — CHLORHEXIDINE GLUCONATE 0.12 % MT SOLN
15.0000 mL | Freq: Once | OROMUCOSAL | Status: AC
  Administered 2022-04-22: 15 mL via OROMUCOSAL
  Filled 2022-04-22: qty 15

## 2022-04-22 MED ORDER — ONDANSETRON HCL 4 MG/2ML IJ SOLN: 4.0000 mg | Freq: Four times a day (QID) | INTRAMUSCULAR | Status: DC | PRN

## 2022-04-22 MED ORDER — GABAPENTIN 300 MG PO CAPS: 300.0000 mg | ORAL_CAPSULE | ORAL | Status: AC

## 2022-04-22 MED ORDER — LIDOCAINE 2% (20 MG/ML) 5 ML SYRINGE
INTRAMUSCULAR | Status: AC
  Filled 2022-04-22: qty 5

## 2022-04-22 MED ORDER — THROMBIN 5000 UNITS EX SOLR
OROMUCOSAL | Status: DC | PRN
  Administered 2022-04-22: 5 mL via TOPICAL

## 2022-04-22 MED ORDER — SODIUM CHLORIDE 0.9% FLUSH: 3.0000 mL | INTRAVENOUS | Status: DC | PRN

## 2022-04-22 MED ORDER — CEFAZOLIN SODIUM-DEXTROSE 2-4 GM/100ML-% IV SOLN
2.0000 g | Freq: Three times a day (TID) | INTRAVENOUS | Status: AC
  Administered 2022-04-22 – 2022-04-23 (×2): 2 g via INTRAVENOUS
  Filled 2022-04-22 (×2): qty 100

## 2022-04-22 MED ORDER — MORPHINE SULFATE (PF) 2 MG/ML IV SOLN: 2.0000 mg | INTRAVENOUS | Status: DC | PRN

## 2022-04-22 MED ORDER — ORAL CARE MOUTH RINSE: 15.0000 mL | Freq: Once | OROMUCOSAL | Status: AC

## 2022-04-22 MED ORDER — ACETAMINOPHEN 500 MG PO TABS
ORAL_TABLET | ORAL | Status: AC
  Filled 2022-04-22: qty 2

## 2022-04-22 MED ORDER — OXYCODONE HCL 5 MG PO TABS: 5.0000 mg | ORAL_TABLET | ORAL | Status: DC | PRN

## 2022-04-22 MED ORDER — PHENYLEPHRINE 80 MCG/ML (10ML) SYRINGE FOR IV PUSH (FOR BLOOD PRESSURE SUPPORT)
PREFILLED_SYRINGE | INTRAVENOUS | Status: AC
  Filled 2022-04-22: qty 10

## 2022-04-22 MED ORDER — ACETAMINOPHEN 500 MG PO TABS: 1000.0000 mg | ORAL_TABLET | ORAL | Status: DC

## 2022-04-22 MED ORDER — OXYCODONE HCL 5 MG PO TABS
ORAL_TABLET | ORAL | Status: AC
  Filled 2022-04-22: qty 1

## 2022-04-22 MED ORDER — CELECOXIB 200 MG PO CAPS
200.0000 mg | ORAL_CAPSULE | Freq: Two times a day (BID) | ORAL | Status: DC
  Administered 2022-04-22: 200 mg via ORAL
  Filled 2022-04-22 (×2): qty 1

## 2022-04-22 MED ORDER — LACTATED RINGERS IV SOLN: INTRAVENOUS | Status: DC

## 2022-04-22 MED ORDER — BUPIVACAINE HCL (PF) 0.25 % IJ SOLN
INTRAMUSCULAR | Status: DC | PRN
  Administered 2022-04-22: 7 mL

## 2022-04-22 MED ORDER — EPHEDRINE 5 MG/ML INJ
INTRAVENOUS | Status: AC
  Filled 2022-04-22: qty 5

## 2022-04-22 MED ORDER — METHOCARBAMOL 1000 MG/10ML IJ SOLN: 500.0000 mg | Freq: Four times a day (QID) | INTRAVENOUS | Status: DC | PRN

## 2022-04-22 MED ORDER — POTASSIUM CHLORIDE IN NACL 20-0.9 MEQ/L-% IV SOLN
INTRAVENOUS | Status: DC
  Filled 2022-04-22: qty 1000

## 2022-04-22 MED ORDER — LIDOCAINE 2% (20 MG/ML) 5 ML SYRINGE
INTRAMUSCULAR | Status: DC | PRN
  Administered 2022-04-22: 60 mg via INTRAVENOUS

## 2022-04-22 MED ORDER — ONDANSETRON HCL 4 MG/2ML IJ SOLN: 4.0000 mg | Freq: Once | INTRAMUSCULAR | Status: DC | PRN

## 2022-04-22 MED ORDER — METOPROLOL TARTRATE 25 MG PO TABS
25.0000 mg | ORAL_TABLET | Freq: Two times a day (BID) | ORAL | Status: DC
  Administered 2022-04-22: 25 mg via ORAL
  Filled 2022-04-22 (×2): qty 1

## 2022-04-22 MED ORDER — FENTANYL CITRATE (PF) 250 MCG/5ML IJ SOLN
INTRAMUSCULAR | Status: AC
  Filled 2022-04-22: qty 5

## 2022-04-22 MED ORDER — HYDROMORPHONE HCL 1 MG/ML IJ SOLN
INTRAMUSCULAR | Status: AC
  Filled 2022-04-22: qty 1

## 2022-04-22 MED ORDER — OXYCODONE HCL 5 MG PO TABS
5.0000 mg | ORAL_TABLET | Freq: Once | ORAL | Status: AC | PRN
  Administered 2022-04-22: 5 mg via ORAL

## 2022-04-22 MED ORDER — DEXMEDETOMIDINE HCL IN NACL 80 MCG/20ML IV SOLN
INTRAVENOUS | Status: AC
  Filled 2022-04-22: qty 20

## 2022-04-22 MED ORDER — SUGAMMADEX SODIUM 200 MG/2ML IV SOLN
INTRAVENOUS | Status: DC | PRN
  Administered 2022-04-22: 200 mg via INTRAVENOUS

## 2022-04-22 MED ORDER — SODIUM CHLORIDE 0.9% FLUSH
3.0000 mL | Freq: Two times a day (BID) | INTRAVENOUS | Status: DC
  Administered 2022-04-22: 3 mL via INTRAVENOUS

## 2022-04-22 MED ORDER — ONDANSETRON HCL 4 MG PO TABS: 4.0000 mg | ORAL_TABLET | Freq: Four times a day (QID) | ORAL | Status: DC | PRN

## 2022-04-22 MED ORDER — HYDROMORPHONE HCL 1 MG/ML IJ SOLN
0.2500 mg | INTRAMUSCULAR | Status: DC | PRN
  Administered 2022-04-22: 0.25 mg via INTRAVENOUS

## 2022-04-22 MED ORDER — METHOCARBAMOL 500 MG PO TABS: 500.0000 mg | ORAL_TABLET | Freq: Four times a day (QID) | ORAL | Status: DC | PRN

## 2022-04-22 MED ORDER — MONTELUKAST SODIUM 10 MG PO TABS: 10.0000 mg | ORAL_TABLET | Freq: Every day | ORAL | Status: DC | PRN

## 2022-04-22 MED ORDER — OXYCODONE HCL 5 MG/5ML PO SOLN: 5.0000 mg | Freq: Once | ORAL | Status: AC | PRN

## 2022-04-22 MED ORDER — DEXAMETHASONE SODIUM PHOSPHATE 10 MG/ML IJ SOLN
INTRAMUSCULAR | Status: AC
  Filled 2022-04-22: qty 1

## 2022-04-22 MED ORDER — PHENOL 1.4 % MT LIQD: 1.0000 | OROMUCOSAL | Status: DC | PRN

## 2022-04-22 MED ORDER — DEXAMETHASONE SODIUM PHOSPHATE 4 MG/ML IJ SOLN
4.0000 mg | Freq: Four times a day (QID) | INTRAMUSCULAR | Status: DC
  Administered 2022-04-22 – 2022-04-23 (×2): 4 mg via INTRAVENOUS
  Filled 2022-04-22 (×4): qty 1

## 2022-04-22 MED ORDER — DEXAMETHASONE 4 MG PO TABS
4.0000 mg | ORAL_TABLET | Freq: Four times a day (QID) | ORAL | Status: DC
  Administered 2022-04-22: 4 mg via ORAL
  Filled 2022-04-22: qty 1

## 2022-04-22 MED ORDER — TRAMADOL HCL 50 MG PO TABS
50.0000 mg | ORAL_TABLET | Freq: Four times a day (QID) | ORAL | Status: DC | PRN
  Administered 2022-04-22: 50 mg via ORAL
  Filled 2022-04-22: qty 1

## 2022-04-22 MED ORDER — VANCOMYCIN HCL IN DEXTROSE 1-5 GM/200ML-% IV SOLN: 1000.0000 mg | INTRAVENOUS | Status: AC

## 2022-04-22 MED ORDER — MENTHOL 3 MG MT LOZG: 1.0000 | LOZENGE | OROMUCOSAL | Status: DC | PRN

## 2022-04-22 MED ORDER — EPHEDRINE SULFATE-NACL 50-0.9 MG/10ML-% IV SOSY
PREFILLED_SYRINGE | INTRAVENOUS | Status: DC | PRN
  Administered 2022-04-22: 10 mg via INTRAVENOUS

## 2022-04-22 MED ORDER — ACETAMINOPHEN 10 MG/ML IV SOLN
INTRAVENOUS | Status: AC
  Filled 2022-04-22: qty 100

## 2022-04-22 MED ORDER — FENTANYL CITRATE (PF) 250 MCG/5ML IJ SOLN
INTRAMUSCULAR | Status: DC | PRN
  Administered 2022-04-22 (×2): 50 ug via INTRAVENOUS
  Administered 2022-04-22: 100 ug via INTRAVENOUS
  Administered 2022-04-22: 50 ug via INTRAVENOUS

## 2022-04-22 MED ORDER — VANCOMYCIN HCL IN DEXTROSE 1-5 GM/200ML-% IV SOLN
INTRAVENOUS | Status: AC
  Administered 2022-04-22: 1000 mg via INTRAVENOUS
  Filled 2022-04-22: qty 200

## 2022-04-22 MED ORDER — CHLORHEXIDINE GLUCONATE CLOTH 2 % EX PADS: 6.0000 | MEDICATED_PAD | Freq: Once | CUTANEOUS | Status: DC

## 2022-04-22 MED ORDER — ACETAMINOPHEN 500 MG PO TABS
1000.0000 mg | ORAL_TABLET | Freq: Four times a day (QID) | ORAL | Status: DC
  Administered 2022-04-22 – 2022-04-23 (×3): 1000 mg via ORAL
  Filled 2022-04-22 (×3): qty 2

## 2022-04-22 MED ORDER — ADULT MULTIVITAMIN W/MINERALS CH
1.0000 | ORAL_TABLET | Freq: Every day | ORAL | Status: DC
  Administered 2022-04-22: 1 via ORAL
  Filled 2022-04-22 (×2): qty 1

## 2022-04-22 MED ORDER — BUPIVACAINE HCL (PF) 0.25 % IJ SOLN
INTRAMUSCULAR | Status: AC
  Filled 2022-04-22: qty 30

## 2022-04-22 MED ORDER — ONDANSETRON HCL 4 MG/2ML IJ SOLN
INTRAMUSCULAR | Status: DC | PRN
  Administered 2022-04-22: 4 mg via INTRAVENOUS

## 2022-04-22 MED ORDER — ROCURONIUM BROMIDE 10 MG/ML (PF) SYRINGE
PREFILLED_SYRINGE | INTRAVENOUS | Status: AC
  Filled 2022-04-22: qty 10

## 2022-04-22 MED ORDER — GABAPENTIN 300 MG PO CAPS
ORAL_CAPSULE | ORAL | Status: AC
  Administered 2022-04-22: 300 mg via ORAL
  Filled 2022-04-22: qty 1

## 2022-04-22 MED ORDER — SODIUM CHLORIDE 0.9 % IV SOLN
250.0000 mL | INTRAVENOUS | Status: DC
  Administered 2022-04-22: 250 mL via INTRAVENOUS

## 2022-04-22 MED ORDER — PROPOFOL 10 MG/ML IV BOLUS
INTRAVENOUS | Status: AC
  Filled 2022-04-22: qty 20

## 2022-04-22 MED ORDER — THROMBIN 20000 UNITS EX SOLR
CUTANEOUS | Status: AC
  Filled 2022-04-22: qty 20000

## 2022-04-22 MED ORDER — AMLODIPINE BESYLATE 10 MG PO TABS
10.0000 mg | ORAL_TABLET | Freq: Every day | ORAL | Status: DC
  Administered 2022-04-22: 10 mg via ORAL
  Filled 2022-04-22 (×2): qty 1

## 2022-04-22 MED ORDER — PROPOFOL 500 MG/50ML IV EMUL
INTRAVENOUS | Status: DC | PRN
  Administered 2022-04-22 (×2): 20 mg via INTRAVENOUS
  Administered 2022-04-22: 120 mg via INTRAVENOUS
  Administered 2022-04-22 (×2): 20 mg via INTRAVENOUS
  Administered 2022-04-22: 25 ug/kg/min via INTRAVENOUS

## 2022-04-22 MED ORDER — ROCURONIUM BROMIDE 10 MG/ML (PF) SYRINGE
PREFILLED_SYRINGE | INTRAVENOUS | Status: DC | PRN
  Administered 2022-04-22 (×3): 10 mg via INTRAVENOUS
  Administered 2022-04-22: 70 mg via INTRAVENOUS
  Administered 2022-04-22: 10 mg via INTRAVENOUS

## 2022-04-22 MED ORDER — ACETAMINOPHEN 10 MG/ML IV SOLN
INTRAVENOUS | Status: DC | PRN
  Administered 2022-04-22: 1000 mg via INTRAVENOUS

## 2022-04-22 MED ORDER — BENAZEPRIL HCL 20 MG PO TABS
40.0000 mg | ORAL_TABLET | Freq: Every day | ORAL | Status: DC
  Filled 2022-04-22: qty 2

## 2022-04-22 MED ORDER — DEXAMETHASONE SODIUM PHOSPHATE 10 MG/ML IJ SOLN
INTRAMUSCULAR | Status: DC | PRN
  Administered 2022-04-22: 10 mg via INTRAVENOUS

## 2022-04-22 MED ORDER — PHENYLEPHRINE HCL-NACL 20-0.9 MG/250ML-% IV SOLN
INTRAVENOUS | Status: DC | PRN
  Administered 2022-04-22: 20 ug/min via INTRAVENOUS

## 2022-04-22 MED ORDER — PHENYLEPHRINE 80 MCG/ML (10ML) SYRINGE FOR IV PUSH (FOR BLOOD PRESSURE SUPPORT)
PREFILLED_SYRINGE | INTRAVENOUS | Status: DC | PRN
  Administered 2022-04-22: 80 ug via INTRAVENOUS

## 2022-04-22 MED ORDER — ONDANSETRON HCL 4 MG/2ML IJ SOLN
INTRAMUSCULAR | Status: AC
  Filled 2022-04-22: qty 2

## 2022-04-22 SURGICAL SUPPLY — 62 items
ADH SKN CLS LQ APL DERMABOND (GAUZE/BANDAGES/DRESSINGS) ×1
APL SKNCLS STERI-STRIP NONHPOA (GAUZE/BANDAGES/DRESSINGS) ×1
BAG COUNTER SPONGE SURGICOUNT (BAG) ×1 IMPLANT
BAG SPNG CNTER NS LX DISP (BAG) ×2
BASKET BONE COLLECTION (BASKET) IMPLANT
BENZOIN TINCTURE PRP APPL 2/3 (GAUZE/BANDAGES/DRESSINGS) ×1 IMPLANT
BLADE CLIPPER SURG (BLADE) IMPLANT
BUR CARBIDE MATCH 3.0 (BURR) ×1 IMPLANT
BUR CUTTER 7.0 ROUND (BURR) IMPLANT
CANISTER SUCT 3000ML PPV (MISCELLANEOUS) ×1 IMPLANT
CNTNR URN SCR LID CUP LEK RST (MISCELLANEOUS) ×1 IMPLANT
CONT SPEC 4OZ STRL OR WHT (MISCELLANEOUS) ×1
COVER BACK TABLE 60X90IN (DRAPES) ×1 IMPLANT
DERMABOND ADVANCED .7 DNX6 (GAUZE/BANDAGES/DRESSINGS) IMPLANT
DRAPE C-ARM 42X72 X-RAY (DRAPES) IMPLANT
DRAPE C-ARMOR (DRAPES) IMPLANT
DRAPE LAPAROTOMY 100X72X124 (DRAPES) ×1 IMPLANT
DRAPE SURG 17X23 STRL (DRAPES) ×1 IMPLANT
DRSG OPSITE 4X5.5 SM (GAUZE/BANDAGES/DRESSINGS) IMPLANT
DRSG OPSITE POSTOP 4X6 (GAUZE/BANDAGES/DRESSINGS) IMPLANT
DURAPREP 26ML APPLICATOR (WOUND CARE) ×1 IMPLANT
ELECT REM PT RETURN 9FT ADLT (ELECTROSURGICAL) ×1
ELECTRODE REM PT RTRN 9FT ADLT (ELECTROSURGICAL) ×1 IMPLANT
EVACUATOR 1/8 PVC DRAIN (DRAIN) IMPLANT
GAUZE 4X4 16PLY ~~LOC~~+RFID DBL (SPONGE) IMPLANT
GLOVE BIO SURGEON STRL SZ7 (GLOVE) IMPLANT
GLOVE BIO SURGEON STRL SZ8 (GLOVE) ×2 IMPLANT
GLOVE BIOGEL PI IND STRL 7.0 (GLOVE) IMPLANT
GOWN STRL REUS W/ TWL LRG LVL3 (GOWN DISPOSABLE) IMPLANT
GOWN STRL REUS W/ TWL XL LVL3 (GOWN DISPOSABLE) ×2 IMPLANT
GOWN STRL REUS W/TWL 2XL LVL3 (GOWN DISPOSABLE) IMPLANT
GOWN STRL REUS W/TWL LRG LVL3 (GOWN DISPOSABLE) ×4
GOWN STRL REUS W/TWL XL LVL3 (GOWN DISPOSABLE) ×3
GRAFT BONE PROTEIOS SM 1CC (Orthopedic Implant) IMPLANT
HEMOSTAT POWDER KIT SURGIFOAM (HEMOSTASIS) IMPLANT
KIT BASIN OR (CUSTOM PROCEDURE TRAY) ×1 IMPLANT
KIT TURNOVER KIT B (KITS) ×1 IMPLANT
MARKER SKIN DUAL TIP RULER LAB (MISCELLANEOUS) IMPLANT
MATRIX STRIP NEOCORE 12C (Putty) IMPLANT
NDL HYPO 25X1 1.5 SAFETY (NEEDLE) ×1 IMPLANT
NEEDLE HYPO 25X1 1.5 SAFETY (NEEDLE) ×1 IMPLANT
NS IRRIG 1000ML POUR BTL (IV SOLUTION) ×1 IMPLANT
PACK LAMINECTOMY NEURO (CUSTOM PROCEDURE TRAY) ×1 IMPLANT
PAD ARMBOARD 7.5X6 YLW CONV (MISCELLANEOUS) ×3 IMPLANT
ROD LORD LIPPED TI 5.5X40 (Rod) IMPLANT
SCREW CORT SHANK MOD 6.5X40 (Screw) IMPLANT
SCREW POLYAXIAL TULIP (Screw) IMPLANT
SET SCREW (Screw) ×4 IMPLANT
SET SCREW SPNE (Screw) IMPLANT
SPONGE SURGIFOAM ABS GEL 100 (HEMOSTASIS) ×1 IMPLANT
SPONGE T-LAP 4X18 ~~LOC~~+RFID (SPONGE) IMPLANT
STRIP CLOSURE SKIN 1/2X4 (GAUZE/BANDAGES/DRESSINGS) ×2 IMPLANT
STRIP MATRIX NEOCORE 12CC (Putty) ×1 IMPLANT
SUT VIC AB 0 CT1 18XCR BRD8 (SUTURE) ×1 IMPLANT
SUT VIC AB 0 CT1 8-18 (SUTURE) ×2
SUT VIC AB 2-0 CP2 18 (SUTURE) ×1 IMPLANT
SUT VIC AB 3-0 SH 8-18 (SUTURE) ×2 IMPLANT
SYR 5ML LUER SLIP (SYRINGE) IMPLANT
TOWEL GREEN STERILE (TOWEL DISPOSABLE) ×1 IMPLANT
TOWEL GREEN STERILE FF (TOWEL DISPOSABLE) ×1 IMPLANT
TRAY FOLEY MTR SLVR 16FR STAT (SET/KITS/TRAYS/PACK) IMPLANT
WATER STERILE IRR 1000ML POUR (IV SOLUTION) ×1 IMPLANT

## 2022-04-22 NOTE — Progress Notes (Signed)
Given report to 5N Sharon,RN

## 2022-04-22 NOTE — Anesthesia Procedure Notes (Signed)
Procedure Name: Intubation Date/Time: 04/22/2022 12:02 PM  Performed by: Ester Rink, CRNAPre-anesthesia Checklist: Patient identified, Emergency Drugs available, Suction available and Patient being monitored Patient Re-evaluated:Patient Re-evaluated prior to induction Oxygen Delivery Method: Circle system utilized Preoxygenation: Pre-oxygenation with 100% oxygen Induction Type: IV induction Ventilation: Mask ventilation without difficulty Laryngoscope Size: Mac and 4 Grade View: Grade I Tube type: Oral Tube size: 7.5 mm Number of attempts: 1 Airway Equipment and Method: Stylet and Oral airway Placement Confirmation: ETT inserted through vocal cords under direct vision, positive ETCO2 and breath sounds checked- equal and bilateral Secured at: 22 cm Tube secured with: Tape Dental Injury: Teeth and Oropharynx as per pre-operative assessment

## 2022-04-22 NOTE — H&P (Signed)
Subjective: Patient is a 88 y.o. male admitted for back pain with bilateral leg pain. Onset of symptoms was several months ago, gradually worsening since that time.  The pain is rated severe, and is located at the across the lower back and radiates to legs. The pain is described as aching and occurs all day. The symptoms have been progressive. Symptoms are exacerbated by exercise, standing, and walking for more than a few minutes. MRI or CT showed spondylolisthesis L4-5 with severe spinal stenosis L2-3 L3-4 and L4-5   Past Medical History:  Diagnosis Date   Allergy    Arthritis    Cancer ([place])    prostate CA 1997, skin CA   Cataract    Colon polyp    COVID 2021   mild   Dysrhythmia    PACs   History of colon polyps    adenoma;[facility]   History of prostate cancer    History of skin cancer    HTN (hypertension)    Neuromuscular disorder ([place])    sciatic   Other and unspecified hyperlipidemia    PAC (premature atrial contraction)    Sleep apnea    uses CPAP    Past Surgical History:  Procedure Laterality Date   COLONOSCOPY     COLONOSCOPY W/ POLYPECTOMY  last 2013    X 2;  GI   EYE SURGERY Bilateral 2021   HEMORROIDECTOMY     INCONTINENCE SURGERY     INSERTION OF MESH N/A 05/04/2020   Procedure: INSERTION OF MESH;  Surgeon: Coralie Keens, MD;  Location: [facility];  Service: General;  Laterality: N/A;   PROSTATE SURGERY     PROSTATECTOMY  1997   Dr Rosana Hoes   [place]      X 3(L twice); Dr Theda Sers   TOTAL KNEE ARTHROPLASTY     L; Dr Maureen Ralphs   TOTAL KNEE REVISION Left 07/29/2020   Procedure: LEFT KNEE REVISION, POLYETHYLENE EXCHANGE;  Surgeon: Gaynelle Arabian, MD;  Location: [facility];  Service: Orthopedics;  Laterality: Left;   UMBILICAL HERNIA REPAIR N/A 05/04/2020   Procedure: UMBILICAL HERNIA REPAIR WITH MESH;  Surgeon: Coralie Keens, MD;  Location: [facility];  Service: General;  Laterality: N/A;  LMA    Prior to  Admission medications   Medication Sig Start Date End Date Taking? Authorizing Provider  acetaminophen (TYLENOL) 325 MG tablet Take 650 mg by mouth every 6 (six) hours as needed for moderate pain. 05/27/21  Yes [provider]  amLODipine (NORVASC) 10 MG tablet Take 1 tablet (10 mg total) by mouth daily. Patient taking differently: Take 10 mg by mouth at bedtime. 05/22/13  Yes Hendricks Limes, MD  atorvastatin (LIPITOR) 20 MG tablet TAKE ONE AND ONE-HALF TABLETS DAILY Patient taking differently: Take 30 mg by mouth every morning. TAKE ONE AND ONE-HALF TABLETS DAILY 05/22/13  Yes Hendricks Limes, MD  benazepril (LOTENSIN) 40 MG tablet Take 1 tablet (40 mg total) by mouth daily. 05/22/13  Yes Hendricks Limes, MD  cetirizine (ZYRTEC) 10 MG tablet Take 10 mg by mouth daily.   Yes [provider]  fluticasone (FLONASE) 50 MCG/ACT nasal spray Place 1 spray into both nostrils daily as needed for allergies or rhinitis.   Yes [provider]  Melatonin 10 MG TABS Take 10 mg by mouth at bedtime as needed (sleep).   Yes [provider]  meloxicam (MOBIC) 15 MG tablet Take 15 mg by mouth daily as needed for pain.  05/27/21  Yes [provider]  metoprolol (LOPRESSOR) 50 MG tablet 1/2 by mouth two times daily Patient taking differently: Take 25 mg by mouth 2 (two) times daily. 05/22/13  Yes Hendricks Limes, MD  montelukast (SINGULAIR) 10 MG tablet Take 10 mg by mouth daily as needed (allergies).   Yes [provider]  Multiple Vitamin (MULTIVITAMIN) tablet Take 1 tablet by mouth daily.   Yes [provider]  ketoconazole (NIZORAL) 2 % cream Apply 1 Application topically 2 (two) times daily as needed for irritation.    [provider]   Allergies  Allergen Reactions   Cefdinir Nausea Only    Per PCP, causes upset stomach   Neo-Bacit-Poly-Lidocaine Rash   Neosporin + Pain Relief Max St [Neomy-Bacit-Polymyx-Pramoxine] Rash    Social  History   Tobacco Use   Smoking status: Never   Smokeless tobacco: Never  Substance Use Topics   Alcohol use: Yes    Alcohol/week: 14.0 standard drinks of alcohol    Types: 14 Shots of liquor per week    Comment:  14 drinks /week    Family History  Problem Relation Age of Onset   Stroke Mother 64   Heart disease Father 46       ?MI   Heart disease Brother        CBAG in 22s   Dementia Brother        vascular dementia   Colon cancer Neg Hx    Stomach cancer Neg Hx    Diabetes Neg Hx    Cancer Neg Hx    Esophageal cancer Neg Hx    Rectal cancer Neg Hx    Sleep apnea Neg Hx      Review of Systems  Positive ROS: neg  All other systems have been reviewed and were otherwise negative with the exception of those mentioned in the HPI and as above.  Objective: Vital signs in last 24 hours: Temp:  [97.3 F (36.3 C)] 97.3 F (36.3 C) (01/19 0911) Pulse Rate:  [70] 70 (01/19 0911) Resp:  [18] 18 (01/19 0911) BP: (117)/(62) 117/62 (01/19 0911) SpO2:  [95 %] 95 % (01/19 0911) Weight:  [83.9 kg] 83.9 kg (01/19 0911)  General Appearance: Alert, cooperative, no distress, appears stated age Head: Normocephalic, without obvious abnormality, atraumatic Eyes: PERRL, conjunctiva/corneas clear, EOM's intact    Neck: Supple, symmetrical, trachea midline Back: Symmetric, no curvature, ROM normal, no CVA tenderness Lungs:  respirations unlabored Heart: Regular rate and rhythm Abdomen: Soft, non-tender Extremities: Extremities normal, atraumatic, no cyanosis or edema Pulses: 2+ and symmetric all extremities Skin: Skin color, texture, turgor normal, no rashes or lesions  NEUROLOGIC:   Mental status: Alert and oriented x4,  no aphasia, good attention span, fund of knowledge, and memory Motor Exam - grossly normal Sensory Exam - grossly normal Reflexes: 1+ Coordination - grossly normal Gait - grossly normal Balance - grossly normal Cranial Nerves: I: smell Not tested  II: visual  acuity  OS: nl    OD: nl  II: visual fields Full to confrontation  II: pupils Equal, round, reactive to light  III,VII: ptosis None  III,IV,VI: extraocular muscles  Full ROM  V: mastication Normal  V: facial light touch sensation  Normal  V,VII: corneal reflex  Present  VII: facial muscle function - upper  Normal  VII: facial muscle function - lower Normal  VIII: hearing Not tested  IX: soft palate elevation  Normal  IX,X: gag reflex Present  XI: trapezius  strength  5/5  XI: sternocleidomastoid strength 5/5  XI: neck flexion strength  5/5  XII: tongue strength  Normal    Data Review Lab Results  Component Value Date   WBC 6.4 04/05/2022   HGB 14.4 04/05/2022   HCT 42.3 04/05/2022   MCV 92.4 04/05/2022   PLT 219 04/05/2022   Lab Results  Component Value Date   NA 137 04/05/2022   K 3.8 04/05/2022   CL 103 04/05/2022   CO2 28 04/05/2022   BUN 13 04/05/2022   CREATININE 0.69 04/05/2022   GLUCOSE 101 (H) 04/05/2022   Lab Results  Component Value Date   INR 1.0 04/05/2022    Assessment/Plan:  Estimated body mass index is 26.54 kg/m as calculated from the following:   Height as of this encounter: '5\' 10"'$  (1.778 m).   Weight as of this encounter: 83.9 kg. Patient admitted for decompression L2-L3 to L4-5 followed by instrumented fusion L4 5 and L3-4. Patient has failed a reasonable attempt at conservative therapy.  I explained the condition and procedure to the patient and answered any questions.  Patient wishes to proceed with procedure as planned. Understands risks/ benefits and typical outcomes of procedure.   Eustace Moore 04/22/2022 11:08 AM

## 2022-04-22 NOTE — Op Note (Signed)
04/22/2022  3:20 PM  PATIENT:  [name]  88 y.o. male  PRE-OPERATIVE DIAGNOSIS: Dynamic spondylolisthesis L4-5, severe spinal stenosis L2-3 L3-4 L4-5, back pain with claudication  POST-OPERATIVE DIAGNOSIS:  same  PROCEDURE:   1. Decompressive lumbar laminectomy, medial facetectomy and foraminotomies L2-3 L3-4 L4-5 2. Posterior fixation L4-5 using ATEC cortical pedicle screws.  4. Intertransverse arthrodesis L3-4 and L4-5 using morcellized autograft and allograft.  SURGEON:  Sherley Bounds, MD  ASSISTANTS: Glenford Peers, FNP  ANESTHESIA:  General  EBL: 100 ml  Total I/O In: 1400 [I.V.:1300; IV Piggyback:100] Out: 1150 [Urine:1050; Blood:100]  BLOOD ADMINISTERED:none  DRAINS: Medium Hemovac  INDICATION FOR PROCEDURE: This patient presented with back pain with leg pain with walking. Imaging revealed very spinal stenosis at L2-3 L3-4 and L4-5 with a dynamic spondylolisthesis at L4-5. The patient tried a reasonable attempt at conservative medical measures without relief. I recommended decompression and instrumented fusion to address the stenosis as well as the segmental  instability.  Patient understood the risks, benefits, and alternatives and potential outcomes and wished to proceed.  PROCEDURE DETAILS:  The patient was brought to the operating room. After induction of generalized endotracheal anesthesia the patient was rolled into the prone position on chest rolls and all pressure points were padded. The patient's lumbar region was cleaned and then prepped with DuraPrep and draped in the usual sterile fashion. Anesthesia was injected and then a dorsal midline incision was made and carried down to the lumbosacral fascia. The fascia was opened and the paraspinous musculature was taken down in a subperiosteal fashion to expose L2-3 L3-4 and L4-5. A self-retaining retractor was placed. Intraoperative fluoroscopy confirmed my level, and I started with placement of the L4 and L5 cortical  pedicle screws. The pedicle screw entry zones were identified utilizing surface landmarks and  AP and lateral fluoroscopy. I scored the cortex with the high-speed drill and then used the hand drill to drill an upward and outward direction into the pedicle. I then tapped line to line. I then placed a 6.5 x 40 mm cortical pedicle screw into the pedicles of L4 and L5 bilaterally.    I then turned my attention to the decompression and complete lumbar laminectomies, medial- facetectomies, and foraminotomies were performed at L2-3 L3-4 and L4-5.  All local bone was saved for later arthrodesis. my nurse practitioner was directly involved in the decompression and exposure of the neural elements. . Much more generous decompression and generous foraminotomy was undertaken in order to adequately decompress the neural elements and address the patient's leg pain. The yellow ligament was removed to expose the underlying dura and nerve roots, and generous foraminotomies were performed to adequately decompress the neural elements. Both the exiting and traversing nerve roots were decompressed on both sides until a coronary dilator passed easily along the nerve roots.  We then decorticated the transverse processes of L3 and L4 and L5 bilaterally and laid a mixture of morcellized autograft and allograft for extra volume out over these to perform intertransverse arthrodesis at L3-L5 bilaterally. We then placed lordotic rods into the multiaxial screw heads of the pedicle screws and locked these in position with the locking caps and anti-torque device. We then checked our construct with AP and lateral fluoroscopy. Irrigated with copious amounts of bacitracin-containing saline solution. Inspected the nerve roots once again to assure adequate decompression, lined to the dura with Gelfoam, placed a medium Hemovac drain through a separate stab incision and then we closed the muscle and the fascia  with 0 Vicryl. Closed the subcutaneous  tissues with 2-0 Vicryl and subcuticular tissues with 3-0 Vicryl. The skin was closed with benzoin and Steri-Strips. Dressing was then applied, the patient was awakened from general anesthesia and transported to the recovery room in stable condition. At the end of the procedure all sponge, needle and instrument counts were correct.   PLAN OF CARE: admit to inpatient  PATIENT DISPOSITION:  PACU - hemodynamically stable.   Delay start of Pharmacological VTE agent (>24hrs) due to surgical blood loss or risk of bleeding:  yes

## 2022-04-22 NOTE — Anesthesia Postprocedure Evaluation (Signed)
Anesthesia Post Note  Patient: [name]  Procedure(s) Performed: Laminectomy and Foraminotomy - Lumbar Three-Lumbar Four, Lumbar Four-Lumbar Five - bilateral, instrumented fusion Lumbar Three-Lumbar Five (Bilateral: Back)     Patient location during evaluation: PACU Anesthesia Type: General Level of consciousness: awake and alert and oriented Pain management: pain level controlled Vital Signs Assessment: post-procedure vital signs reviewed and stable Respiratory status: spontaneous breathing, nonlabored ventilation and respiratory function stable Cardiovascular status: blood pressure returned to baseline and stable Postop Assessment: no apparent nausea or vomiting Anesthetic complications: no   There were no known notable events for this encounter.  Last Vitals:  Vitals:   04/22/22 1530 04/22/22 1545  BP: 139/74 (!) 126/53  Pulse: 71 81  Resp: 14 19  Temp: 36.4 C   SpO2: 98% 92%    Last Pain:  Vitals:   04/22/22 1545  TempSrc:   PainSc: 5                  Tiya Schrupp A.

## 2022-04-22 NOTE — Anesthesia Preprocedure Evaluation (Signed)
Anesthesia Evaluation    Airway Mallampati: II  TM Distance: >3 FB Neck ROM: Full    Dental  (+) Dental Advisory Given, Caps   Pulmonary sleep apnea and Continuous Positive Airway Pressure Ventilation    Pulmonary exam normal breath sounds clear to auscultation       Cardiovascular hypertension, Pt. on medications Normal cardiovascular exam Rhythm:Regular Rate:Normal     Neuro/Psych  Neuromuscular disease  negative psych ROS   GI/Hepatic Neg liver ROS,GERD  ,,  Endo/Other  Hyperlipidemia  Renal/GU negative Renal ROS  negative genitourinary   Musculoskeletal  (+) Arthritis , Osteoarthritis,  Spondylolisthesis L3-4, L4-5   Abdominal   Peds  Hematology negative hematology ROS (+)   Anesthesia Other Findings   Reproductive/Obstetrics                              Anesthesia Physical Anesthesia Plan  ASA: 3  Anesthesia Plan: General   Post-op Pain Management: Precedex, Ofirmev IV (intra-op)* and Dilaudid IV   Induction: Intravenous  PONV Risk Score and Plan: 3 and Treatment may vary due to age or medical condition, Ondansetron and Dexamethasone  Airway Management Planned: Oral ETT  Additional Equipment: None  Intra-op Plan:   Post-operative Plan: Extubation in OR  Informed Consent: I have reviewed the patients History and Physical, chart, labs and discussed the procedure including the risks, benefits and alternatives for the proposed anesthesia with the patient or authorized representative who has indicated his/her understanding and acceptance.     Dental advisory given  Plan Discussed with: Anesthesiologist and CRNA  Anesthesia Plan Comments:          Anesthesia Quick Evaluation

## 2022-04-22 NOTE — Transfer of Care (Signed)
Immediate Anesthesia Transfer of Care Note  Patient: SELASSIE SPATAFORE  Procedure(s) Performed: Laminectomy and Foraminotomy - Lumbar Three-Lumbar Four, Lumbar Four-Lumbar Five - bilateral, instrumented fusion Lumbar Three-Lumbar Five (Bilateral: Back)  Patient Location: PACU  Anesthesia Type:General  Level of Consciousness: awake, alert , oriented, drowsy, and patient cooperative  Airway & Oxygen Therapy: Patient connected to face mask oxygen  Post-op Assessment: Report given to RN and Post -op Vital signs reviewed and stable  Post vital signs: Reviewed and stable  Last Vitals:  Vitals Value Taken Time  BP 141/103 04/22/22 1527  Temp    Pulse 75 04/22/22 1529  Resp 13 04/22/22 1529  SpO2 98 % 04/22/22 1529  Vitals shown include unvalidated device data.  Last Pain:  Vitals:   04/22/22 0959  TempSrc:   PainSc: 0-No pain         Complications: There were no known notable events for this encounter.

## 2022-04-23 DIAGNOSIS — M4316 Spondylolisthesis, lumbar region: Secondary | ICD-10-CM | POA: Diagnosis not present

## 2022-04-23 MED ORDER — METHOCARBAMOL 500 MG PO TABS: 500.0000 mg | ORAL_TABLET | Freq: Four times a day (QID) | ORAL | 0 refills | Status: AC | PRN

## 2022-04-23 MED ORDER — TRAMADOL HCL 50 MG PO TABS: 50.0000 mg | ORAL_TABLET | Freq: Four times a day (QID) | ORAL | 0 refills | Status: AC | PRN

## 2022-04-23 MED ORDER — TRAMADOL HCL 50 MG PO TABS: 50.0000 mg | ORAL_TABLET | Freq: Four times a day (QID) | ORAL | 0 refills | Status: DC | PRN

## 2022-04-23 NOTE — TOC Transition Note (Signed)
Transition of Care [facility]) - CM/SW Discharge Note   Patient Details  Name: [name] MRN: [id number] Date of Birth: 11-Jul-1934  Transition of Care [facility] CM/SW Contact:  Bartholomew Crews, RN Phone Number: [phone] 04/23/2022, 10:38 AM   Clinical Narrative:     Spoke with patient at the bedside to discuss post acute transition. Patient stated that he already has BSC and RW at home. Family to provide transportation home. No further TOC needs identified.   Final next level of care: Home/Self Care Barriers to Discharge: No Barriers Identified   Patient Goals and CMS Choice      Discharge Placement                         Discharge Plan and Services Additional resources added to the After Visit Summary for                                       Social Determinants of Health (SDOH) Interventions SDOH Screenings   Food Insecurity: No Food Insecurity (04/22/2022)  Housing: Low Risk  (04/22/2022)  Transportation Needs: No Transportation Needs (04/22/2022)  Utilities: Not At Risk (04/22/2022)  Tobacco Use: Low Risk  (04/22/2022)     Readmission Risk Interventions     No data to display

## 2022-04-23 NOTE — Care Management Obs Status (Signed)
[place] NOTIFICATION   Patient Details  Name: [name] MRN: [id number] Date of Birth: November 10, 1934   Medicare Observation Status Notification Given:  Yes    Bartholomew Crews, RN 04/23/2022, 10:37 AM

## 2022-04-23 NOTE — Progress Notes (Signed)
Discharge instructions given to the patient, verbalized understanding.  Hemovac discontinued per MD order.  Site with scant serous-sanguinous drainage,gauze dressing applied.  Tolerated well by patient.  Discharged home.

## 2022-04-23 NOTE — Discharge Summary (Signed)
Physician Discharge Summary  Patient ID: [name] MRN: [id number] DOB/AGE: 11-26-1934 88 y.o.  Admit date: 04/22/2022 Discharge date: 04/23/2022  Admission Diagnoses:  Lumbar radiculopathy  Discharge Diagnoses:  Same Principal Problem:   S/P lumbar fusion   Discharged Condition: Stable  Hospital Course:  [name] is a 88 y.o. male admitted after elective lumbar decompression/fusion. He reported significant improvement in preoperative pain. He was ambulating well, tolerating diet, voiding normally, with pain controlled with oral medication and requested d/c home.  Treatments: Surgery - lumbar decompression/fusion L3-5  Discharge Exam: Blood pressure (!) 109/53, pulse 78, temperature 97.7 F (36.5 C), resp. rate 18, height '5\' 10"'$  (1.778 m), weight 83.9 kg, SpO2 94 %. Awake, alert, oriented Speech fluent, appropriate CN grossly intact 5/5 BUE/BLE Wound c/d/i  Disposition: Discharge disposition: 01-Home or Self Care       Discharge Instructions     Call MD for:  redness, tenderness, or signs of infection (pain, swelling, redness, odor or green/yellow discharge around incision site)   Complete by: As directed    Call MD for:  temperature >100.4   Complete by: As directed    Diet - low sodium heart healthy   Complete by: As directed    Discharge instructions   Complete by: As directed    Walk at home as much as possible, at least 4 times / day   Increase activity slowly   Complete by: As directed    Lifting restrictions   Complete by: As directed    No lifting > 10 lbs   May shower / Bathe   Complete by: As directed    48 hours after surgery   May walk up steps   Complete by: As directed    Other Restrictions   Complete by: As directed    No bending/twisting at waist   Remove dressing in 24 hours   Complete by: As directed       Allergies as of 04/23/2022       Reactions   Cefdinir Nausea Only   Per PCP, causes upset stomach    Neo-bacit-poly-lidocaine Rash   Neosporin + Pain Relief Max St [neomy-bacit-polymyx-pramoxine] Rash        Medication List     TAKE these medications    acetaminophen 325 MG tablet Commonly known as: TYLENOL Take 650 mg by mouth every 6 (six) hours as needed for moderate pain.   amLODipine 10 MG tablet Commonly known as: NORVASC Take 1 tablet (10 mg total) by mouth daily. What changed: when to take this   atorvastatin 20 MG tablet Commonly known as: LIPITOR TAKE ONE AND ONE-HALF TABLETS DAILY What changed:  how much to take how to take this when to take this   benazepril 40 MG tablet Commonly known as: LOTENSIN Take 1 tablet (40 mg total) by mouth daily.   cetirizine 10 MG tablet Commonly known as: ZYRTEC Take 10 mg by mouth daily.   fluticasone 50 MCG/ACT nasal spray Commonly known as: FLONASE Place 1 spray into both nostrils daily as needed for allergies or rhinitis.   ketoconazole 2 % cream Commonly known as: NIZORAL Apply 1 Application topically 2 (two) times daily as needed for irritation.   Melatonin 10 MG Tabs Take 10 mg by mouth at bedtime as needed (sleep).   meloxicam 15 MG tablet Commonly known as: MOBIC Take 15 mg by mouth daily as needed for pain.   methocarbamol 500 MG tablet Commonly known as: ROBAXIN Take 1  tablet (500 mg total) by mouth every 6 (six) hours as needed for muscle spasms.   metoprolol tartrate 50 MG tablet Commonly known as: LOPRESSOR 1/2 by mouth two times daily What changed:  how much to take how to take this when to take this additional instructions   montelukast 10 MG tablet Commonly known as: SINGULAIR Take 10 mg by mouth daily as needed (allergies).   multivitamin tablet Take 1 tablet by mouth daily.   traMADol 50 MG tablet Commonly known as: ULTRAM Take 1 tablet (50 mg total) by mouth every 6 (six) hours as needed for up to 7 days for severe pain.               Durable Medical Equipment  (From  admission, onward)           Start     Ordered   04/22/22 1648  DME Walker rolling  Once       Question:  Patient needs a walker to treat with the following condition  Answer:  S/P lumbar fusion   04/22/22 1647   04/22/22 1648  DME 3 n 1  Once        04/22/22 1647            Follow-up Information     Eustace Moore, MD Follow up.   Specialty: Neurosurgery Contact information: [address] [place] [address] [phone]                 Signed: Jairo Ben 04/23/2022, 10:15 AM

## 2022-04-23 NOTE — Evaluation (Signed)
Physical Therapy Evaluation Patient Details Name: [name] MRN: [id number] DOB: [birth year] Today's Date: 04/23/2022  History of Present Illness  Pt is 88 yo male present for scheduled lumbar decompression and fusion 04/22/22. PMH: arthritis, prostate cancer, cataracts, colon plyp, dysrhythmia, HTN, PAC, sleep apnea.  Clinical Impression  Pt is moving well. Requires verbal cues for log roll technique due to this is a new technique to patient and occasionally to stay closer to AD while ambulating in the hall. Pt is able to ambulate slightly slower with wider base of support without AD at Mod I. Pt is supervision for stairs and Mod I for other functional mobility activities. Due to pt current functional status, home set up and available assistance at home no recommended skilled physical therapy services once discharged from acute care hospital setting.        Recommendations for follow up therapy are one component of a multi-disciplinary discharge planning process, led by the attending physician.  Recommendations may be updated based on patient status, additional functional criteria and insurance authorization.  Follow Up Recommendations No PT follow up      Assistance Recommended at Discharge PRN  Patient can return home with the following  Help with stairs or ramp for entrance;Assistance with cooking/housework;Assist for transportation    Equipment Recommendations None recommended by PT  Recommendations for Other Services       Functional Status Assessment Patient has had a recent decline in their functional status and demonstrates the ability to make significant improvements in function in a reasonable and predictable amount of time.     Precautions / Restrictions Precautions Precautions: Back Precaution Booklet Issued: Yes (comment) Precaution Comments: provided with handout; went over precautions Required Braces or Orthoses: Spinal Brace Spinal Brace: Lumbar  corset Restrictions Weight Bearing Restrictions: No      Mobility  Bed Mobility Overal bed mobility: Needs Assistance Bed Mobility: Supine to Sit, Sit to Supine     Supine to sit: Supervision Sit to supine: Supervision   General bed mobility comments: verbal cues for log roll with good re-call Patient Response: Cooperative  Transfers Overall transfer level: Modified independent Equipment used: Rolling walker (2 wheels)               General transfer comment: no noted deficits    Ambulation/Gait Ambulation/Gait assistance: Modified independent (Device/Increase time) Gait Distance (Feet): 200 Feet Assistive device: Rolling walker (2 wheels) Gait Pattern/deviations: Decreased step length - left, Decreased step length - right, Decreased stride length, Step-through pattern   Gait velocity interpretation: 1.31 - 2.62 ft/sec, indicative of limited community ambulator   General Gait Details: no overt LOB. Occasional verbal cues to stay close to RW. Pt is able to walk short distances without RW at Mod I slow with slightly wider BOS.  Stairs Stairs: Yes Stairs assistance: Supervision Stair Management: With walker, Step to pattern Number of Stairs: 2 General stair comments: per home set up; supervision for safety  Wheelchair Mobility    Modified Rankin (Stroke Patients Only)       Balance Overall balance assessment: No apparent balance deficits (not formally assessed)         Pertinent Vitals/Pain Pain Assessment Pain Assessment: 0-10 Pain Score: 2  Pain Descriptors / Indicators: Aching Pain Intervention(s): Monitored during session    Home Living Family/patient expects to be discharged to:: Private residence Living Arrangements: Spouse/significant other Available Help at Discharge: Family (family is coming to help over the weekend.) Type of Home: House Home Access: Stairs  to enter Entrance Stairs-Rails: None Entrance Stairs-Number of Steps: 2   Home  Layout: One level Home Equipment: Rolling Walker (2 wheels);Grab bars - tub/shower;Hand held shower head;Shower seat;Cane - single point      Prior Function Prior Level of Function : Independent/Modified Independent             Mobility Comments: Pt was very mobile prior to hospitalization. He was driving ADLs Comments: Pt was independent     Hand Dominance   Dominant Hand: Right    Extremity/Trunk Assessment   Upper Extremity Assessment Upper Extremity Assessment: Defer to OT evaluation    Lower Extremity Assessment Lower Extremity Assessment: Overall WFL for tasks assessed    Cervical / Trunk Assessment Cervical / Trunk Assessment: Normal;Back Surgery  Communication   Communication: No difficulties  Cognition Arousal/Alertness: Awake/alert Behavior During Therapy: WFL for tasks assessed/performed Overall Cognitive Status: Within Functional Limits for tasks assessed            General Comments General comments (skin integrity, edema, etc.): Pt is moving well. He has assistance at home from spouse.        Assessment/Plan    PT Assessment Patient needs continued PT services  PT Problem List Decreased balance;Decreased mobility       PT Treatment Interventions DME instruction;Therapeutic exercise;Gait training;Balance training;Stair training;Neuromuscular re-education;Functional mobility training;Therapeutic activities;Patient/family education    PT Goals (Current goals can be found in the Care Plan section)  Acute Rehab PT Goals Patient Stated Goal: Go home as soon as possible PT Goal Formulation: With patient Time For Goal Achievement: 05/07/22 Potential to Achieve Goals: Good    Frequency Min 5X/week        AM-PAC PT "6 Clicks" Mobility  Outcome Measure Help needed turning from your back to your side while in a flat bed without using bedrails?: A Little Help needed moving from lying on your back to sitting on the side of a flat bed without using  bedrails?: None Help needed moving to and from a bed to a chair (including a wheelchair)?: None Help needed standing up from a chair using your arms (e.g., wheelchair or bedside chair)?: None Help needed to walk in hospital room?: None Help needed climbing 3-5 steps with a railing? : A Little 6 Click Score: 22    End of Session Equipment Utilized During Treatment: Gait belt Activity Tolerance: Patient tolerated treatment well Patient left: in chair;with call bell/phone within reach Nurse Communication: Mobility status PT Visit Diagnosis: Other abnormalities of gait and mobility (R26.89)    Time: 5784-6962 PT Time Calculation (min) (ACUTE ONLY): 45 min   Charges:   PT Evaluation $PT Eval Low Complexity: 1 Low PT Treatments $Gait Training: 8-22 mins $Therapeutic Activity: 8-22 mins      Tomma Rakers, DPT, [place] Office: [phone] (Secure chat preferred)   Ander Purpura 04/23/2022, 11:05 AM

## 2022-04-23 NOTE — Evaluation (Signed)
Occupational Therapy Evaluation Patient Details Name: [name] MRN: [id number] DOB: 11-14-1934 Today's Date: 04/23/2022   History of Present Illness Pt is 88 yo male s/p  L3-5 PLIF. PMH: arthritis, prostate cancer, cataracts, colon plyp, dysrhythmia, HTN, PAC, sleep apnea.   Clinical Impression   Completed education regarding back precautions using compensatory strategies and DME. Handout reviewed. Spoke with 2 of his children over the phone to review precautions and answer questions. Recommend DC home @ RW level with S for ADL tasks. No further OT needed.      Recommendations for follow up therapy are one component of a multi-disciplinary discharge planning process, led by the attending physician.  Recommendations may be updated based on patient status, additional functional criteria and insurance authorization.   Follow Up Recommendations  No OT follow up     Assistance Recommended at Discharge Frequent or constant Supervision/Assistance (initially with mobility and ADL)  Patient can return home with the following A little help with walking and/or transfers;A little help with bathing/dressing/bathroom;Assistance with cooking/housework;Assist for transportation    Functional Status Assessment  Patient has had a recent decline in their functional status and demonstrates the ability to make significant improvements in function in a reasonable and predictable amount of time.  Equipment Recommendations  None recommended by OT    Recommendations for Other Services       Precautions / Restrictions Precautions Precautions: Back Precaution Booklet Issued: Yes (comment) Precaution Comments: provided with handout; went over precautions Required Braces or Orthoses: Spinal Brace Spinal Brace: Lumbar corset Restrictions Weight Bearing Restrictions: No      Mobility Bed Mobility Overal bed mobility: Needs Assistance Bed Mobility: Supine to Sit, Sit to Supine     Supine to sit:  Supervision Sit to supine: Supervision   General bed mobility comments: verbal cues for log roll with good re-call    Transfers Overall transfer level: Modified independent Equipment used: Rolling walker (2 wheels)               General transfer comment: mild unsteadiness; using "furntiure walking technique when not using RW; recommen RW initially; pt/family verbalized understanding      Balance Overall balance assessment: No apparent balance deficits (not formally assessed)                                         ADL either performed or assessed with clinical judgement   ADL Overall ADL's : Needs assistance/impaired                                     Functional mobility during ADLs: Min guard General ADL Comments: Educated on back precautions and compensatory strategies; able to complete figure four positioning for LB ADL; recommend use of reacher as needed; reviewed need to not push pants to floor when using the bathroom to reduce need to bend over to pick them up; Educated on use of back brace; able to donn/doff with S; Educated on home set up to reduce risk of falls     Vision Baseline Vision/History: 1 Wears glasses       Perception     Praxis      Pertinent Vitals/Pain Pain Assessment Pain Assessment: Faces Faces Pain Scale: Hurts a little bit Pain Location: back Pain Descriptors / Indicators: Aching, Discomfort Pain Intervention(s):  Limited activity within patient's tolerance, Other (comment) ("feels better than it did before")     Hand Dominance Right   Extremity/Trunk Assessment Upper Extremity Assessment Upper Extremity Assessment: Overall WFL for tasks assessed   Lower Extremity Assessment Lower Extremity Assessment: Defer to PT evaluation   Cervical / Trunk Assessment Cervical / Trunk Assessment: Normal;Back Surgery   Communication Communication Communication: No difficulties   Cognition Arousal/Alertness:  Awake/alert Behavior During Therapy: WFL for tasks assessed/performed Overall Cognitive Status: Within Functional Limits for tasks assessed                                 General Comments: very talkative     General Comments  Pt is moving well. He has assistance at home from spouse.    Exercises     Shoulder Instructions      Home Living Family/patient expects to be discharged to:: Private residence Living Arrangements: Spouse/significant other Available Help at Discharge: Family (family is coming to help over the weekend.) Type of Home: House Home Access: Stairs to enter Technical brewer of Steps: 2 Entrance Stairs-Rails: None Home Layout: One level     Bathroom Shower/Tub: Occupational psychologist: Handicapped height Bathroom Accessibility: Yes How Accessible: Accessible via walker Home Equipment: Rolling Walker (2 wheels);Grab bars - tub/shower;Hand held shower head;Shower seat;Cane - single point          Prior Functioning/Environment Prior Level of Function : Independent/Modified Independent             Mobility Comments: Pt was very mobile prior to hospitalization. He was driving ADLs Comments: Pt was independent        OT Problem List: Decreased knowledge of use of DME or AE;Decreased knowledge of precautions;Pain      OT Treatment/Interventions:      OT Goals(Current goals can be found in the care plan section) Acute Rehab OT Goals Patient Stated Goal: to go home today OT Goal Formulation: All assessment and education complete, DC therapy  OT Frequency:      Co-evaluation              AM-PAC OT "6 Clicks" Daily Activity     Outcome Measure Help from another person eating meals?: None Help from another person taking care of personal grooming?: A Little Help from another person toileting, which includes using toliet, bedpan, or urinal?: A Little Help from another person bathing (including washing, rinsing,  drying)?: A Little Help from another person to put on and taking off regular upper body clothing?: A Little Help from another person to put on and taking off regular lower body clothing?: A Little 6 Click Score: 19   End of Session Equipment Utilized During Treatment: Back brace Nurse Communication: Mobility status  Activity Tolerance: Patient tolerated treatment well Patient left: in bed;with call bell/phone within reach  OT Visit Diagnosis: Unsteadiness on feet (R26.81);Muscle weakness (generalized) (M62.81);Pain Pain - part of body:  (back)                Time: 0160-1093 OT Time Calculation (min): 40 min Charges:  OT General Charges $OT Visit: 1 Visit OT Evaluation $OT Eval Low Complexity: 1 Low OT Treatments $Self Care/Home Management : 8-22 mins  Maurie Boettcher, OT/L   Acute OT Clinical Specialist Acute Rehabilitation Services Pager [phone] Office [phone]   [facility] 04/23/2022, 11:42 AM

## 2022-04-25 ENCOUNTER — Encounter (HOSPITAL_COMMUNITY): Payer: Self-pay | Admitting: Neurological Surgery

## 2022-05-17 ENCOUNTER — Other Ambulatory Visit (HOSPITAL_COMMUNITY): Payer: Self-pay

## 2022-05-17 MED ORDER — LAGEVRIO 200 MG PO CAPS
4.0000 | ORAL_CAPSULE | Freq: Two times a day (BID) | ORAL | 0 refills | Status: DC
  Filled 2022-05-17: qty 40, 5d supply, fill #0

## 2022-05-23 ENCOUNTER — Ambulatory Visit: Payer: Medicare HMO | Admitting: Adult Health

## 2022-07-20 ENCOUNTER — Ambulatory Visit: Payer: Medicare HMO | Admitting: Adult Health

## 2022-07-27 ENCOUNTER — Ambulatory Visit: Payer: Medicare HMO | Admitting: Adult Health

## 2022-07-28 NOTE — Progress Notes (Signed)
PATIENT: [name] DOB: 09-02-1934  REASON FOR VISIT: follow up HISTORY FROM: patient PRIMARY NEUROLOGIST: Dr. Frances Furbish  Chief Complaint  Patient presents with   Follow-up    Rm 19, cpap follow up.  No concerns.  ESS 3.  Brought extra supplied for sleep lab if would like.       HISTORY OF PRESENT ILLNESS:  [name] is a 88 y.o. male with a history of OSA on CPAP. Returns today for follow-up.  CPAP is working well for him.  He denies any new issues.  Download is below     07/30/21: [name] is an 88 year old male with a history of obstructive sleep apnea on CPAP.  He returns today for CPAP download after receiving a new machine.  His download is below.  Reports that he is using the nasal pillows.  Did not like the fullface.    Today 08/01/22:    05/19/21: [name] is a an 88 year old male with a history of obstructive sleep apnea on CPAP.  He returns today for follow-up.  He reports that the CPAP is working well for him.  He states that he is due for new machine and would like his replaced.  He returns today for evaluation.      REVIEW OF SYSTEMS: Out of a complete 14 system review of symptoms, the patient complains only of the following symptoms, and all other reviewed systems are negative.   ESS 2 FSS 14   ALLERGIES: Allergies  Allergen Reactions   Cefdinir Nausea Only    Per PCP, causes upset stomach   Neo-Bacit-Poly-Lidocaine Rash   Neosporin + Pain Relief Max St [Neomy-Bacit-Polymyx-Pramoxine] Rash    HOME MEDICATIONS: Outpatient Medications Prior to Visit  Medication Sig Dispense Refill   acetaminophen (TYLENOL) 325 MG tablet Take 650 mg by mouth every 6 (six) hours as needed for moderate pain.     amLODipine (NORVASC) 10 MG tablet Take 1 tablet (10 mg total) by mouth daily. (Patient taking differently: Take 10 mg by mouth at bedtime.) 90 tablet 3   atorvastatin (LIPITOR) 20 MG tablet TAKE ONE AND ONE-HALF TABLETS DAILY (Patient taking differently: Take  30 mg by mouth every morning. TAKE ONE AND ONE-HALF TABLETS DAILY) 135 tablet 3   benazepril (LOTENSIN) 40 MG tablet Take 1 tablet (40 mg total) by mouth daily. 90 tablet 3   cetirizine (ZYRTEC) 10 MG tablet Take 10 mg by mouth daily.     fluticasone (FLONASE) 50 MCG/ACT nasal spray Place 1 spray into both nostrils daily as needed for allergies or rhinitis.     ketoconazole (NIZORAL) 2 % cream Apply 1 Application topically 2 (two) times daily as needed for irritation.     Melatonin 10 MG TABS Take 10 mg by mouth at bedtime as needed (sleep).     meloxicam (MOBIC) 15 MG tablet Take 15 mg by mouth daily as needed for pain.     methocarbamol (ROBAXIN) 500 MG tablet Take 1 tablet (500 mg total) by mouth every 6 (six) hours as needed for muscle spasms. 60 tablet 0   metoprolol (LOPRESSOR) 50 MG tablet 1/2 by mouth two times daily (Patient taking differently: Take 25 mg by mouth 2 (two) times daily.) 90 tablet 3   montelukast (SINGULAIR) 10 MG tablet Take 10 mg by mouth daily as needed (allergies).     Multiple Vitamin (MULTIVITAMIN) tablet Take 1 tablet by mouth daily.     molnupiravir EUA (LAGEVRIO) 200 MG CAPS capsule  Take 4 capsules (800 mg total) by mouth every 12 (twelve) hours for 5 days 40 capsule 0   No facility-administered medications prior to visit.    PAST MEDICAL HISTORY: Past Medical History:  Diagnosis Date   Allergy    Arthritis    Cancer (HCC)    prostate CA 1997, skin CA   Cataract    Colon polyp    COVID 2021   mild   Dysrhythmia    PACs   History of colon polyps    adenoma;[facility]   History of prostate cancer    History of skin cancer    HTN (hypertension)    Neuromuscular disorder (HCC)    sciatic   Other and unspecified hyperlipidemia    PAC (premature atrial contraction)    Sleep apnea    uses CPAP    PAST SURGICAL HISTORY: Past Surgical History:  Procedure Laterality Date   COLONOSCOPY     COLONOSCOPY W/ POLYPECTOMY  last 2013    X 2; [facility]    EYE SURGERY Bilateral 2021   HEMORROIDECTOMY     INCONTINENCE SURGERY     INSERTION OF MESH N/A 05/04/2020   Procedure: INSERTION OF MESH;  Surgeon: Abigail Miyamoto, MD;  Location: [facility];  Service: General;  Laterality: N/A;   LAMINECTOMY WITH POSTERIOR LATERAL ARTHRODESIS LEVEL 2 Bilateral 04/22/2022   Procedure: Laminectomy and Foraminotomy - Lumbar Three-Lumbar Four, Lumbar Four-Lumbar Five - bilateral, instrumented fusion Lumbar Three-Lumbar Five;  Surgeon: Tia Alert, MD;  Location: [facility];  Service: Neurosurgery;  Laterality: Bilateral;   PROSTATE SURGERY     PROSTATECTOMY  1997   Dr Earlene Plater   ROTATOR CUFF REPAIR      X 3(L twice); Dr Thomasena Edis   TOTAL KNEE ARTHROPLASTY     L; Dr Despina Hick   TOTAL KNEE REVISION Left 07/29/2020   Procedure: LEFT KNEE REVISION, POLYETHYLENE EXCHANGE;  Surgeon: Ollen Gross, MD;  Location: [facility];  Service: Orthopedics;  Laterality: Left;   UMBILICAL HERNIA REPAIR N/A 05/04/2020   Procedure: UMBILICAL HERNIA REPAIR WITH MESH;  Surgeon: Abigail Miyamoto, MD;  Location: [facility];  Service: General;  Laterality: N/A;  LMA    FAMILY HISTORY: Family History  Problem Relation Age of Onset   Stroke Mother 76   Heart disease Father 59       ?MI   Heart disease Brother        CBAG in 39s   Dementia Brother        vascular dementia   Colon cancer Neg Hx    Stomach cancer Neg Hx    Diabetes Neg Hx    Cancer Neg Hx    Esophageal cancer Neg Hx    Rectal cancer Neg Hx    Sleep apnea Neg Hx     SOCIAL HISTORY: Social History   Socioeconomic History   Marital status: Married    Spouse name: Not on file   Number of children: 3   Years of education: BA   Highest education level: Not on file  Occupational History   Occupation: Retired  Tobacco Use   Smoking status: Never   Smokeless tobacco: Never  Vaping Use   Vaping Use: Never used  Substance and Sexual Activity   Alcohol use: Yes    Alcohol/week: 14.0  standard drinks of alcohol    Types: 14 Shots of liquor per week    Comment:  14 drinks /week   Drug use: No  Sexual activity: Not on file  Other Topics Concern   Not on file  Social History Narrative   Drinks 2 cups of coffee a day    Social Determinants of Health   Financial Resource Strain: Not on file  Food Insecurity: No Food Insecurity (04/22/2022)   Hunger Vital Sign    Worried About Running Out of Food in the Last Year: Never true    Ran Out of Food in the Last Year: Never true  Transportation Needs: No Transportation Needs (04/22/2022)   PRAPARE - Administrator, Civil Service (Medical): No    Lack of Transportation (Non-Medical): No  Physical Activity: Not on file  Stress: Not on file  Social Connections: Not on file  Intimate Partner Violence: Not At Risk (04/22/2022)   Humiliation, Afraid, Rape, and Kick questionnaire    Fear of Current or Ex-Partner: No    Emotionally Abused: No    Physically Abused: No    Sexually Abused: No      PHYSICAL EXAM  Vitals:   08/01/22 0924  BP: 108/64  Pulse: 73  Weight: 187 lb 3.2 oz (84.9 kg)  Height: 5\' 10"  (1.778 m)   Body mass index is 26.86 kg/m.  Generalized: Well developed, in no acute distress  Chest: Lungs clear to auscultation bilaterally  Neurological examination  Mentation: Alert oriented to time, place, history taking. Follows all commands speech and language fluent Cranial nerve II-XII: Facial symmetry noted   DIAGNOSTIC DATA (LABS, IMAGING, TESTING) - I reviewed patient records, labs, notes, testing and imaging myself where available.  Lab Results  Component Value Date   WBC 6.4 04/05/2022   HGB 14.4 04/05/2022   HCT 42.3 04/05/2022   MCV 92.4 04/05/2022   PLT 219 04/05/2022      Component Value Date/Time   NA 137 04/05/2022 1101   K 3.8 04/05/2022 1101   CL 103 04/05/2022 1101   CO2 28 04/05/2022 1101   GLUCOSE 101 (H) 04/05/2022 1101   GLUCOSE 101 (H) 02/07/2006 0821   BUN 13  04/05/2022 1101   CREATININE 0.69 04/05/2022 1101   CALCIUM 9.1 04/05/2022 1101   PROT 6.9 04/05/2022 1101   ALBUMIN 4.1 04/05/2022 1101   AST 33 04/05/2022 1101   ALT 35 04/05/2022 1101   ALKPHOS 81 04/05/2022 1101   BILITOT 0.9 04/05/2022 1101   GFRNONAA >60 04/05/2022 1101   GFRAA  05/19/2010 0432    >60        The eGFR has been calculated using the MDRD equation. This calculation has not been validated in all clinical situations. eGFR's persistently <60 mL/min signify possible Chronic Kidney Disease.   Lab Results  Component Value Date   CHOL 204 (H) 05/22/2013   HDL 74.10 05/22/2013   LDLCALC 114 (H) 05/11/2012   LDLDIRECT 113.8 05/22/2013   TRIG 77.0 05/22/2013   CHOLHDL 3 05/22/2013   Lab Results  Component Value Date   HGBA1C 5.9 10/09/2006   No results found for: "VITAMINB12" Lab Results  Component Value Date   TSH 1.76 05/22/2013      ASSESSMENT AND PLAN 88 y.o. year old male  has a past medical history of Allergy, Arthritis, Cancer (HCC), Cataract, Colon polyp, COVID (2021), Dysrhythmia, History of colon polyps, History of prostate cancer, History of skin cancer, HTN (hypertension), Neuromuscular disorder (HCC), Other and unspecified hyperlipidemia, PAC (premature atrial contraction), and Sleep apnea. here with:  OSA on CPAP  - CPAP compliance excellent - Good treatment of AHI  -  Encourage patient to use CPAP nightly and > 4 hours each night - F/U in 1 year or sooner if needed   Butch Penny, MSN, NP-C 08/01/2022, 9:30 AM [facility] [address] [place], [place] [address] [phone]

## 2022-08-01 ENCOUNTER — Ambulatory Visit: Payer: Medicare Other | Admitting: Adult Health

## 2022-08-01 ENCOUNTER — Ambulatory Visit: Payer: Medicare HMO | Admitting: Adult Health

## 2022-08-01 ENCOUNTER — Encounter: Payer: Self-pay | Admitting: Adult Health

## 2022-08-01 VITALS — BP 108/64 | HR 73 | Ht 70.0 in | Wt 187.2 lb

## 2022-08-01 DIAGNOSIS — G4733 Obstructive sleep apnea (adult) (pediatric): Secondary | ICD-10-CM | POA: Diagnosis not present

## 2022-08-01 NOTE — Patient Instructions (Signed)
Continue using CPAP nightly and greater than 4 hours each night °If your symptoms worsen or you develop new symptoms please let us know.  ° °

## 2022-09-09 ENCOUNTER — Other Ambulatory Visit (HOSPITAL_COMMUNITY): Payer: Self-pay | Admitting: Student

## 2022-09-09 DIAGNOSIS — M5416 Radiculopathy, lumbar region: Secondary | ICD-10-CM

## 2022-09-11 ENCOUNTER — Ambulatory Visit (HOSPITAL_COMMUNITY)
Admission: RE | Admit: 2022-09-11 | Discharge: 2022-09-11 | Disposition: A | Discharge status: Home / Self Care | Payer: Medicare Other | Source: Ambulatory Visit | Attending: Student | Admitting: Student

## 2022-09-11 DIAGNOSIS — M5416 Radiculopathy, lumbar region: Secondary | ICD-10-CM | POA: Diagnosis present

## 2022-09-11 MED ORDER — GADOBUTROL 1 MMOL/ML IV SOLN
8.0000 mL | Freq: Once | INTRAVENOUS | Status: AC | PRN
  Administered 2022-09-11: 8 mL via INTRAVENOUS

## 2022-09-14 ENCOUNTER — Other Ambulatory Visit: Payer: Self-pay | Admitting: Student

## 2022-09-14 DIAGNOSIS — M5416 Radiculopathy, lumbar region: Secondary | ICD-10-CM

## 2022-09-16 MED ORDER — DIAZEPAM 5 MG PO TABS: 5.0000 mg | ORAL_TABLET | Freq: Once | ORAL | Status: DC

## 2022-09-16 MED ORDER — ONDANSETRON HCL 4 MG/2ML IJ SOLN
4.0000 mg | Freq: Once | INTRAMUSCULAR | Status: AC | PRN
  Administered 2022-09-19: 4 mg via INTRAMUSCULAR

## 2022-09-16 MED ORDER — MEPERIDINE HCL 50 MG/ML IJ SOLN
50.0000 mg | Freq: Once | INTRAMUSCULAR | Status: AC | PRN
  Administered 2022-09-19: 50 mg via INTRAMUSCULAR

## 2022-09-16 NOTE — Discharge Instructions (Signed)

## 2022-09-19 ENCOUNTER — Other Ambulatory Visit: Payer: Self-pay | Admitting: Interventional Radiology

## 2022-09-19 ENCOUNTER — Ambulatory Visit
Admission: RE | Admit: 2022-09-19 | Discharge: 2022-09-19 | Disposition: A | Discharge status: Home / Self Care | Payer: Medicare Other | Source: Ambulatory Visit | Attending: Student | Admitting: Student

## 2022-09-19 DIAGNOSIS — M5416 Radiculopathy, lumbar region: Secondary | ICD-10-CM

## 2022-09-19 DIAGNOSIS — G8918 Other acute postprocedural pain: Secondary | ICD-10-CM

## 2022-09-19 MED ORDER — IOPAMIDOL (ISOVUE-M 200) INJECTION 41%
15.0000 mL | Freq: Once | INTRAMUSCULAR | Status: AC
  Administered 2022-09-19: 15 mL via INTRATHECAL

## 2022-09-19 NOTE — Discharge Instr - Other Info (Addendum)
1301- Pt reports 9/10 pain. Pre myelogram procedure. MD El-Abd aware. IM demerol and zofran given-see MAR   1441- pt reports 3/10 pain, relief noted

## 2022-10-05 ENCOUNTER — Other Ambulatory Visit (HOSPITAL_COMMUNITY): Payer: Self-pay

## 2022-10-26 ENCOUNTER — Other Ambulatory Visit: Payer: Self-pay | Admitting: Neurological Surgery

## 2022-11-02 NOTE — Progress Notes (Signed)
Surgical Instructions   Your procedure is scheduled on Monday November 14, 2022. Report to [facility] Main Entrance "A" at 7:45 A.M., then check in with the Admitting office. Any questions or running late day of surgery: call [phone]  Questions prior to your surgery date: call [phone], Monday-Friday, 8am-4pm. If you experience any cold or flu symptoms such as cough, fever, chills, shortness of breath, etc. between now and your scheduled surgery, please notify us at the above number.     Remember:  Do not eat after midnight the night before your surgery  Take these medicines the morning of surgery with A SIP OF WATER  amLODipine (NORVASC)  atorvastatin (LIPITOR)  metoprolol (LOPRESSOR)   May take these medicines IF NEEDED: acetaminophen (TYLENOL)  HYDROcodone-acetaminophen (NORCO/VICODIN)  methocarbamol (ROBAXIN)   One week prior to surgery, STOP taking any Aspirin (unless otherwise instructed by your surgeon) Aleve, Naproxen, Ibuprofen, Motrin, Advil, Goody's, BC's, all herbal medications, fish oil, and non-prescription vitamins.                     Do NOT Smoke (Tobacco/Vaping) for 24 hours prior to your procedure.  If you use a CPAP at night, you may bring your mask/headgear for your overnight stay.   You will be asked to remove any contacts, glasses, piercing's, hearing aid's, dentures/partials prior to surgery. Please bring cases for these items if needed.    Patients discharged the day of surgery will not be allowed to drive home, and someone needs to stay with them for 24 hours.  SURGICAL WAITING ROOM VISITATION Patients may have no more than 2 support people in the waiting area - these visitors may rotate.   Pre-op nurse will coordinate an appropriate time for 1 ADULT support person, who may not rotate, to accompany patient in pre-op.  Children under the age of 72 must have an adult with them who is not the patient and must remain in the main waiting area with an  adult.  If the patient needs to stay at the hospital during part of their recovery, the visitor guidelines for inpatient rooms apply.  Please refer to the [facility] website for the visitor guidelines for any additional information.   If you received a COVID test during your pre-op visit  it is requested that you wear a mask when out in public, stay away from anyone that may not be feeling well and notify your surgeon if you develop symptoms. If you have been in contact with anyone that has tested positive in the last 10 days please notify you surgeon.      Pre-operative 5 CHG Bathing Instructions   You can play a key role in reducing the risk of infection after surgery. Your skin needs to be as free of germs as possible. You can reduce the number of germs on your skin by washing with CHG (chlorhexidine gluconate) soap before surgery. CHG is an antiseptic soap that kills germs and continues to kill germs even after washing.   DO NOT use if you have an allergy to chlorhexidine/CHG or antibacterial soaps. If your skin becomes reddened or irritated, stop using the CHG and notify one of our RNs at [phone].   Please shower with the CHG soap starting 4 days before surgery using the following schedule:     Please keep in mind the following:  DO NOT shave, including legs and underarms, starting the day of your first shower.   You may shave your face  at any point before/day of surgery.  Place clean sheets on your bed the day you start using CHG soap. Use a clean washcloth (not used since being washed) for each shower. DO NOT sleep with pets once you start using the CHG.   CHG Shower Instructions:  If you choose to wash your hair and private area, wash first with your normal shampoo/soap.  After you use shampoo/soap, rinse your hair and body thoroughly to remove shampoo/soap residue.  Turn the water OFF and apply about 3 tablespoons (45 ml) of CHG soap to a CLEAN washcloth.  Apply CHG  soap ONLY FROM YOUR NECK DOWN TO YOUR TOES (washing for 3-5 minutes)  DO NOT use CHG soap on face, private areas, open wounds, or sores.  Pay special attention to the area where your surgery is being performed.  If you are having back surgery, having someone wash your back for you may be helpful. Wait 2 minutes after CHG soap is applied, then you may rinse off the CHG soap.  Pat dry with a clean towel  Put on clean clothes/pajamas   If you choose to wear lotion, please use ONLY the CHG-compatible lotions on the back of this paper.   Additional instructions for the day of surgery: DO NOT APPLY any lotions, powders, deodorants, cologne  Do not bring valuables to the hospital. [facility] is not responsible for any belongings/valuables. Put on clean/comfortable clothes.  Please brush your teeth.  Ask your nurse before applying any prescription medications to the skin.     CHG Compatible Lotions   Aveeno Moisturizing lotion  Cetaphil Moisturizing Cream  Cetaphil Moisturizing Lotion  Clairol Herbal Essence Moisturizing Lotion, Dry Skin  Clairol Herbal Essence Moisturizing Lotion, Extra Dry Skin  Clairol Herbal Essence Moisturizing Lotion, Normal Skin  Curel Age Defying Therapeutic Moisturizing Lotion with Alpha Hydroxy  Curel Extreme Care Body Lotion  Curel Soothing Hands Moisturizing Hand Lotion  Curel Therapeutic Moisturizing Cream, Fragrance-Free  Curel Therapeutic Moisturizing Lotion, Fragrance-Free  Curel Therapeutic Moisturizing Lotion, Original Formula  Eucerin Daily Replenishing Lotion  Eucerin Dry Skin Therapy Plus Alpha Hydroxy Crme  Eucerin Dry Skin Therapy Plus Alpha Hydroxy Lotion  Eucerin Original Crme  Eucerin Original Lotion  Eucerin Plus Crme Eucerin Plus Lotion  Eucerin TriLipid Replenishing Lotion  Keri Anti-Bacterial Hand Lotion  Keri Deep Conditioning Original Lotion Dry Skin Formula Softly Scented  Keri Deep Conditioning Original Lotion, Fragrance Free  Sensitive Skin Formula  Keri Lotion Fast Absorbing Fragrance Free Sensitive Skin Formula  Keri Lotion Fast Absorbing Softly Scented Dry Skin Formula  Keri Original Lotion  Keri Skin Renewal Lotion Keri Silky Smooth Lotion  Keri Silky Smooth Sensitive Skin Lotion  Nivea Body Creamy Conditioning Oil  Nivea Body Extra Enriched Lotion  Nivea Body Original Lotion  Nivea Body Sheer Moisturizing Lotion Nivea Crme  Nivea Skin Firming Lotion  NutraDerm 30 Skin Lotion  NutraDerm Skin Lotion  NutraDerm Therapeutic Skin Cream  NutraDerm Therapeutic Skin Lotion  ProShield Protective Hand Cream  Provon moisturizing lotion  Please read over the following fact sheets that you were given.

## 2022-11-03 ENCOUNTER — Encounter (HOSPITAL_COMMUNITY)
Admission: RE | Admit: 2022-11-03 | Discharge: 2022-11-03 | Disposition: A | Discharge status: Home / Self Care | Payer: Medicare Other | Source: Ambulatory Visit | Attending: Neurological Surgery | Admitting: Neurological Surgery

## 2022-11-03 ENCOUNTER — Encounter (HOSPITAL_COMMUNITY): Payer: Self-pay

## 2022-11-03 ENCOUNTER — Other Ambulatory Visit: Payer: Self-pay

## 2022-11-03 VITALS — BP 125/86 | HR 68 | Temp 98.1°F | Resp 17 | Ht 70.0 in | Wt 178.9 lb

## 2022-11-03 DIAGNOSIS — I499 Cardiac arrhythmia, unspecified: Secondary | ICD-10-CM | POA: Diagnosis not present

## 2022-11-03 DIAGNOSIS — Z01818 Encounter for other preprocedural examination: Secondary | ICD-10-CM

## 2022-11-03 DIAGNOSIS — G4730 Sleep apnea, unspecified: Secondary | ICD-10-CM | POA: Diagnosis not present

## 2022-11-03 DIAGNOSIS — I10 Essential (primary) hypertension: Secondary | ICD-10-CM | POA: Insufficient documentation

## 2022-11-03 DIAGNOSIS — Z01812 Encounter for preprocedural laboratory examination: Secondary | ICD-10-CM | POA: Insufficient documentation

## 2022-11-03 DIAGNOSIS — Z79899 Other long term (current) drug therapy: Secondary | ICD-10-CM | POA: Diagnosis not present

## 2022-11-03 LAB — TYPE AND SCREEN
ABO/RH(D): O POS
Antibody Screen: NEGATIVE

## 2022-11-03 LAB — CBC
HCT: 41 % (ref 39.0–52.0)
Hemoglobin: 13.2 g/dL (ref 13.0–17.0)
MCH: 29.7 pg (ref 26.0–34.0)
MCHC: 32.2 g/dL (ref 30.0–36.0)
MCV: 92.3 fL (ref 80.0–100.0)
Platelets: 313 10*3/uL (ref 150–400)
RBC: 4.44 MIL/uL (ref 4.22–5.81)
RDW: 13.7 % (ref 11.5–15.5)
WBC: 7.5 10*3/uL (ref 4.0–10.5)
nRBC: 0 % (ref 0.0–0.2)

## 2022-11-03 LAB — SURGICAL PCR SCREEN
MRSA, PCR: NEGATIVE
Staphylococcus aureus: NEGATIVE

## 2022-11-03 LAB — BASIC METABOLIC PANEL
Anion gap: 9 (ref 5–15)
BUN: 12 mg/dL (ref 8–23)
CO2: 27 mmol/L (ref 22–32)
Calcium: 9.1 mg/dL (ref 8.9–10.3)
Chloride: 104 mmol/L (ref 98–111)
Creatinine, Ser: 0.68 mg/dL (ref 0.61–1.24)
GFR, Estimated: >60 mL/min (ref >60)
Glucose, Bld: 100 mg/dL — ABNORMAL HIGH (ref 70–99)
Potassium: 3.9 mmol/L (ref 3.5–5.1)
Sodium: 140 mmol/L (ref 135–145)

## 2022-11-03 LAB — PROTIME-INR
INR: 1 (ref 0.8–1.2)
Prothrombin Time: 13 seconds (ref 11.4–15.2)

## 2022-11-03 NOTE — Progress Notes (Signed)
PCP - Dr. Chilton Greathouse Cardiologist - denies  PPM/ICD - denies Device Orders - n/a Rep Notified - n/a  Chest x-ray - n/a EKG - 04/05/2022 Stress Test -  ECHO -  Cardiac Cath -   Sleep Study -  diagnosed with sleep apnea CPAP - uses nightly  Non-diabetic  Blood Thinner Instructions: denies Aspirin Instructions:denies  ERAS Protcol - No, NPO  COVID TEST- n/a   Anesthesia review:  Yes. HTN, dysrhythmia, sleep apnea with CPAP  Patient denies shortness of breath, fever, cough and chest pain at PAT appointment   All instructions explained to the patient, with a verbal understanding of the material. Patient agrees to go over the instructions while at home for a better understanding. Patient also instructed to self quarantine after being tested for COVID-19. The opportunity to ask questions was provided.

## 2022-11-13 NOTE — Anesthesia Preprocedure Evaluation (Signed)
Anesthesia Evaluation  Patient identified by MRN, date of birth, ID band Patient awake    Reviewed: Allergy & Precautions, NPO status , Patient's Chart, lab work & pertinent test results, reviewed documented beta blocker date and time   History of Anesthesia Complications Negative for: history of anesthetic complications  Airway Mallampati: II  TM Distance: >3 FB Neck ROM: Full    Dental  (+) Caps, Dental Advisory Given   Pulmonary sleep apnea and Continuous Positive Airway Pressure Ventilation    breath sounds clear to auscultation       Cardiovascular hypertension, Pt. on medications and Pt. on home beta blockers (-) angina  Rhythm:Regular Rate:Normal     Neuro/Psych Chronic back pain    GI/Hepatic Neg liver ROS,GERD  Controlled,,  Endo/Other  negative endocrine ROS    Renal/GU negative Renal ROS   H/o prostate cancer    Musculoskeletal  (+) Arthritis ,    Abdominal   Peds  Hematology   Anesthesia Other Findings   Reproductive/Obstetrics                             Anesthesia Physical Anesthesia Plan  ASA: 3  Anesthesia Plan: General   Post-op Pain Management: Tylenol PO (pre-op)*   Induction: Intravenous  PONV Risk Score and Plan: 2 and Ondansetron and Dexamethasone  Airway Management Planned: Oral ETT  Additional Equipment: None  Intra-op Plan:   Post-operative Plan: Extubation in OR  Informed Consent: I have reviewed the patients History and Physical, chart, labs and discussed the procedure including the risks, benefits and alternatives for the proposed anesthesia with the patient or authorized representative who has indicated his/her understanding and acceptance.     Dental advisory given  Plan Discussed with: CRNA and Surgeon  Anesthesia Plan Comments:         Anesthesia Quick Evaluation

## 2022-11-14 ENCOUNTER — Encounter (HOSPITAL_COMMUNITY): Payer: Self-pay | Admitting: Neurological Surgery

## 2022-11-14 ENCOUNTER — Other Ambulatory Visit: Payer: Self-pay

## 2022-11-14 ENCOUNTER — Inpatient Hospital Stay (HOSPITAL_COMMUNITY): Payer: Medicare Other | Admitting: Anesthesiology

## 2022-11-14 ENCOUNTER — Inpatient Hospital Stay (HOSPITAL_COMMUNITY): Payer: Medicare Other | Admitting: Physician Assistant

## 2022-11-14 ENCOUNTER — Inpatient Hospital Stay (HOSPITAL_COMMUNITY)
Admission: RE | Admit: 2022-11-14 | Discharge: 2022-11-15 | DRG: 460 | Disposition: A | Discharge status: Home / Self Care | Payer: Medicare Other | Attending: Neurological Surgery | Admitting: Neurological Surgery

## 2022-11-14 ENCOUNTER — Encounter (HOSPITAL_COMMUNITY)
Admission: RE | Disposition: A | Discharge status: Home / Self Care | Payer: Self-pay | Source: Home / Self Care | Attending: Neurological Surgery

## 2022-11-14 ENCOUNTER — Inpatient Hospital Stay (HOSPITAL_COMMUNITY): Payer: Medicare Other

## 2022-11-14 DIAGNOSIS — G8929 Other chronic pain: Secondary | ICD-10-CM | POA: Diagnosis present

## 2022-11-14 DIAGNOSIS — Z79899 Other long term (current) drug therapy: Secondary | ICD-10-CM | POA: Diagnosis not present

## 2022-11-14 DIAGNOSIS — E785 Hyperlipidemia, unspecified: Secondary | ICD-10-CM | POA: Diagnosis not present

## 2022-11-14 DIAGNOSIS — M5416 Radiculopathy, lumbar region: Secondary | ICD-10-CM | POA: Diagnosis not present

## 2022-11-14 DIAGNOSIS — Z791 Long term (current) use of non-steroidal anti-inflammatories (NSAID): Secondary | ICD-10-CM

## 2022-11-14 DIAGNOSIS — M5126 Other intervertebral disc displacement, lumbar region: Secondary | ICD-10-CM | POA: Diagnosis not present

## 2022-11-14 DIAGNOSIS — Z884 Allergy status to anesthetic agent status: Secondary | ICD-10-CM | POA: Diagnosis not present

## 2022-11-14 DIAGNOSIS — Z9079 Acquired absence of other genital organ(s): Secondary | ICD-10-CM | POA: Diagnosis not present

## 2022-11-14 DIAGNOSIS — Z8616 Personal history of COVID-19: Secondary | ICD-10-CM

## 2022-11-14 DIAGNOSIS — Z85828 Personal history of other malignant neoplasm of skin: Secondary | ICD-10-CM | POA: Diagnosis not present

## 2022-11-14 DIAGNOSIS — Z8546 Personal history of malignant neoplasm of prostate: Secondary | ICD-10-CM | POA: Diagnosis not present

## 2022-11-14 DIAGNOSIS — Z823 Family history of stroke: Secondary | ICD-10-CM

## 2022-11-14 DIAGNOSIS — Z8249 Family history of ischemic heart disease and other diseases of the circulatory system: Secondary | ICD-10-CM | POA: Diagnosis not present

## 2022-11-14 DIAGNOSIS — Z96652 Presence of left artificial knee joint: Secondary | ICD-10-CM | POA: Diagnosis present

## 2022-11-14 DIAGNOSIS — M5116 Intervertebral disc disorders with radiculopathy, lumbar region: Secondary | ICD-10-CM | POA: Diagnosis present

## 2022-11-14 DIAGNOSIS — I10 Essential (primary) hypertension: Secondary | ICD-10-CM

## 2022-11-14 DIAGNOSIS — Z888 Allergy status to other drugs, medicaments and biological substances status: Secondary | ICD-10-CM

## 2022-11-14 DIAGNOSIS — Z981 Arthrodesis status: Secondary | ICD-10-CM

## 2022-11-14 DIAGNOSIS — K219 Gastro-esophageal reflux disease without esophagitis: Secondary | ICD-10-CM | POA: Diagnosis present

## 2022-11-14 DIAGNOSIS — M545 Low back pain, unspecified: Secondary | ICD-10-CM | POA: Diagnosis present

## 2022-11-14 HISTORY — PX: LAMINECTOMY WITH POSTERIOR LATERAL ARTHRODESIS LEVEL 2: SHX6336

## 2022-11-14 SURGERY — LAMINECTOMY WITH POSTERIOR LATERAL ARTHRODESIS LEVEL 2
Anesthesia: General | Site: Back | Laterality: Left

## 2022-11-14 MED ORDER — ROCURONIUM BROMIDE 10 MG/ML (PF) SYRINGE
PREFILLED_SYRINGE | INTRAVENOUS | Status: AC
  Filled 2022-11-14: qty 10

## 2022-11-14 MED ORDER — ADULT MULTIVITAMIN W/MINERALS CH: 1.0000 | ORAL_TABLET | Freq: Every day | ORAL | Status: DC

## 2022-11-14 MED ORDER — METHOCARBAMOL 500 MG PO TABS
500.0000 mg | ORAL_TABLET | Freq: Four times a day (QID) | ORAL | Status: DC | PRN
  Administered 2022-11-14: 500 mg via ORAL
  Filled 2022-11-14 (×2): qty 1

## 2022-11-14 MED ORDER — PHENYLEPHRINE 80 MCG/ML (10ML) SYRINGE FOR IV PUSH (FOR BLOOD PRESSURE SUPPORT)
PREFILLED_SYRINGE | INTRAVENOUS | Status: DC | PRN
  Administered 2022-11-14: 160 ug via INTRAVENOUS

## 2022-11-14 MED ORDER — THROMBIN 5000 UNITS EX SOLR
OROMUCOSAL | Status: DC | PRN
  Administered 2022-11-14: 5 mL

## 2022-11-14 MED ORDER — DEXAMETHASONE SODIUM PHOSPHATE 4 MG/ML IJ SOLN
4.0000 mg | Freq: Four times a day (QID) | INTRAMUSCULAR | Status: DC
  Administered 2022-11-14: 4 mg via INTRAVENOUS
  Filled 2022-11-14: qty 1

## 2022-11-14 MED ORDER — 0.9 % SODIUM CHLORIDE (POUR BTL) OPTIME
TOPICAL | Status: DC | PRN
Start: 2022-11-14 — End: 2022-11-14
  Administered 2022-11-14: 1000 mL

## 2022-11-14 MED ORDER — OXYCODONE HCL 5 MG/5ML PO SOLN: 5.0000 mg | Freq: Once | ORAL | Status: DC | PRN

## 2022-11-14 MED ORDER — LACTATED RINGERS IV SOLN: INTRAVENOUS | Status: DC

## 2022-11-14 MED ORDER — CHLORHEXIDINE GLUCONATE 0.12 % MT SOLN
15.0000 mL | Freq: Once | OROMUCOSAL | Status: AC
  Administered 2022-11-14: 15 mL via OROMUCOSAL
  Filled 2022-11-14: qty 15

## 2022-11-14 MED ORDER — THROMBIN 20000 UNITS EX SOLR
CUTANEOUS | Status: DC | PRN
  Administered 2022-11-14: 20 mL

## 2022-11-14 MED ORDER — MEPERIDINE HCL 25 MG/ML IJ SOLN: 6.2500 mg | INTRAMUSCULAR | Status: DC | PRN

## 2022-11-14 MED ORDER — CHLORHEXIDINE GLUCONATE CLOTH 2 % EX PADS: 6.0000 | MEDICATED_PAD | Freq: Once | CUTANEOUS | Status: DC

## 2022-11-14 MED ORDER — PROPOFOL 10 MG/ML IV BOLUS
INTRAVENOUS | Status: AC
  Filled 2022-11-14: qty 20

## 2022-11-14 MED ORDER — DEXAMETHASONE SODIUM PHOSPHATE 10 MG/ML IJ SOLN
INTRAMUSCULAR | Status: AC
  Filled 2022-11-14: qty 1

## 2022-11-14 MED ORDER — MENTHOL 3 MG MT LOZG: 1.0000 | LOZENGE | OROMUCOSAL | Status: DC | PRN

## 2022-11-14 MED ORDER — ONDANSETRON HCL 4 MG PO TABS: 4.0000 mg | ORAL_TABLET | Freq: Four times a day (QID) | ORAL | Status: DC | PRN

## 2022-11-14 MED ORDER — CEFAZOLIN SODIUM-DEXTROSE 2-4 GM/100ML-% IV SOLN
2.0000 g | Freq: Three times a day (TID) | INTRAVENOUS | Status: AC
  Administered 2022-11-14 – 2022-11-15 (×2): 2 g via INTRAVENOUS
  Filled 2022-11-14 (×2): qty 100

## 2022-11-14 MED ORDER — PROMETHAZINE HCL 25 MG/ML IJ SOLN: 6.2500 mg | INTRAMUSCULAR | Status: DC | PRN

## 2022-11-14 MED ORDER — SODIUM CHLORIDE 0.9% FLUSH
3.0000 mL | Freq: Two times a day (BID) | INTRAVENOUS | Status: DC
  Administered 2022-11-14: 3 mL via INTRAVENOUS

## 2022-11-14 MED ORDER — SURGIRINSE WOUND IRRIGATION SYSTEM - OPTIME
TOPICAL | Status: DC | PRN
  Administered 2022-11-14: 450 mL

## 2022-11-14 MED ORDER — OXYCODONE HCL 5 MG PO TABS: 5.0000 mg | ORAL_TABLET | Freq: Once | ORAL | Status: DC | PRN

## 2022-11-14 MED ORDER — BUPIVACAINE HCL (PF) 0.25 % IJ SOLN
INTRAMUSCULAR | Status: AC
  Filled 2022-11-14: qty 30

## 2022-11-14 MED ORDER — ACETAMINOPHEN 500 MG PO TABS
1000.0000 mg | ORAL_TABLET | ORAL | Status: AC
  Administered 2022-11-14: 1000 mg via ORAL
  Filled 2022-11-14: qty 2

## 2022-11-14 MED ORDER — ONDANSETRON HCL 4 MG/2ML IJ SOLN
INTRAMUSCULAR | Status: AC
  Filled 2022-11-14: qty 2

## 2022-11-14 MED ORDER — HYDROMORPHONE HCL 1 MG/ML IJ SOLN
0.2500 mg | INTRAMUSCULAR | Status: DC | PRN
  Administered 2022-11-14 (×2): 0.25 mg via INTRAVENOUS
  Administered 2022-11-14: 0.5 mg via INTRAVENOUS

## 2022-11-14 MED ORDER — LIDOCAINE 2% (20 MG/ML) 5 ML SYRINGE
INTRAMUSCULAR | Status: DC | PRN
  Administered 2022-11-14: 60 mg via INTRAVENOUS
  Administered 2022-11-14: 20 mg via INTRAVENOUS

## 2022-11-14 MED ORDER — ACETAMINOPHEN 325 MG PO TABS: 650.0000 mg | ORAL_TABLET | ORAL | Status: DC | PRN

## 2022-11-14 MED ORDER — HYDROCODONE-ACETAMINOPHEN 5-325 MG PO TABS
1.0000 | ORAL_TABLET | ORAL | Status: DC | PRN
  Administered 2022-11-14 (×2): 1 via ORAL
  Filled 2022-11-14 (×2): qty 1

## 2022-11-14 MED ORDER — PHENYLEPHRINE HCL-NACL 20-0.9 MG/250ML-% IV SOLN
INTRAVENOUS | Status: DC | PRN
  Administered 2022-11-14: 35 ug/min via INTRAVENOUS

## 2022-11-14 MED ORDER — ORAL CARE MOUTH RINSE: 15.0000 mL | Freq: Once | OROMUCOSAL | Status: AC

## 2022-11-14 MED ORDER — VANCOMYCIN HCL IN DEXTROSE 1-5 GM/200ML-% IV SOLN
1000.0000 mg | INTRAVENOUS | Status: AC
  Administered 2022-11-14: 1000 mg via INTRAVENOUS
  Filled 2022-11-14: qty 200

## 2022-11-14 MED ORDER — ONDANSETRON HCL 4 MG/2ML IJ SOLN: 4.0000 mg | Freq: Four times a day (QID) | INTRAMUSCULAR | Status: DC | PRN

## 2022-11-14 MED ORDER — FENTANYL CITRATE (PF) 250 MCG/5ML IJ SOLN
INTRAMUSCULAR | Status: DC | PRN
  Administered 2022-11-14: 150 ug via INTRAVENOUS
  Administered 2022-11-14 (×2): 50 ug via INTRAVENOUS

## 2022-11-14 MED ORDER — SENNA 8.6 MG PO TABS
1.0000 | ORAL_TABLET | Freq: Two times a day (BID) | ORAL | Status: DC
  Administered 2022-11-14: 8.6 mg via ORAL
  Filled 2022-11-14: qty 1

## 2022-11-14 MED ORDER — BUPIVACAINE HCL (PF) 0.25 % IJ SOLN
INTRAMUSCULAR | Status: DC | PRN
  Administered 2022-11-14: 10 mL

## 2022-11-14 MED ORDER — METOPROLOL TARTRATE 25 MG PO TABS
25.0000 mg | ORAL_TABLET | Freq: Two times a day (BID) | ORAL | Status: DC
  Administered 2022-11-14: 25 mg via ORAL
  Filled 2022-11-14: qty 1

## 2022-11-14 MED ORDER — SUGAMMADEX SODIUM 200 MG/2ML IV SOLN
INTRAVENOUS | Status: DC | PRN
  Administered 2022-11-14: 200 mg via INTRAVENOUS

## 2022-11-14 MED ORDER — ACETAMINOPHEN 650 MG RE SUPP: 650.0000 mg | RECTAL | Status: DC | PRN

## 2022-11-14 MED ORDER — GABAPENTIN 300 MG PO CAPS
300.0000 mg | ORAL_CAPSULE | ORAL | Status: AC
  Administered 2022-11-14: 300 mg via ORAL
  Filled 2022-11-14: qty 1

## 2022-11-14 MED ORDER — CELECOXIB 200 MG PO CAPS
200.0000 mg | ORAL_CAPSULE | Freq: Two times a day (BID) | ORAL | Status: DC
  Administered 2022-11-14: 200 mg via ORAL
  Filled 2022-11-14: qty 1

## 2022-11-14 MED ORDER — AMLODIPINE BESYLATE 10 MG PO TABS: 10.0000 mg | ORAL_TABLET | Freq: Every day | ORAL | Status: DC

## 2022-11-14 MED ORDER — SODIUM CHLORIDE 0.9 % IV SOLN
250.0000 mL | INTRAVENOUS | Status: DC
  Administered 2022-11-14: 250 mL via INTRAVENOUS

## 2022-11-14 MED ORDER — LIDOCAINE 2% (20 MG/ML) 5 ML SYRINGE
INTRAMUSCULAR | Status: AC
  Filled 2022-11-14: qty 5

## 2022-11-14 MED ORDER — ONDANSETRON HCL 4 MG/2ML IJ SOLN
INTRAMUSCULAR | Status: DC | PRN
  Administered 2022-11-14: 4 mg via INTRAVENOUS

## 2022-11-14 MED ORDER — THROMBIN 20000 UNITS EX SOLR
CUTANEOUS | Status: AC
  Filled 2022-11-14: qty 20000

## 2022-11-14 MED ORDER — SODIUM CHLORIDE 0.9% FLUSH: 3.0000 mL | INTRAVENOUS | Status: DC | PRN

## 2022-11-14 MED ORDER — FENTANYL CITRATE (PF) 250 MCG/5ML IJ SOLN
INTRAMUSCULAR | Status: AC
  Filled 2022-11-14: qty 5

## 2022-11-14 MED ORDER — PHENOL 1.4 % MT LIQD: 1.0000 | OROMUCOSAL | Status: DC | PRN

## 2022-11-14 MED ORDER — THROMBIN 5000 UNITS EX SOLR
CUTANEOUS | Status: AC
  Filled 2022-11-14: qty 5000

## 2022-11-14 MED ORDER — BENAZEPRIL HCL 20 MG PO TABS: 40.0000 mg | ORAL_TABLET | Freq: Every day | ORAL | Status: DC

## 2022-11-14 MED ORDER — MIDAZOLAM HCL 2 MG/2ML IJ SOLN: 0.5000 mg | Freq: Once | INTRAMUSCULAR | Status: DC | PRN

## 2022-11-14 MED ORDER — HYDROMORPHONE HCL 1 MG/ML IJ SOLN
INTRAMUSCULAR | Status: AC
  Filled 2022-11-14: qty 1

## 2022-11-14 MED ORDER — ROCURONIUM BROMIDE 10 MG/ML (PF) SYRINGE
PREFILLED_SYRINGE | INTRAVENOUS | Status: DC | PRN
  Administered 2022-11-14: 20 mg via INTRAVENOUS
  Administered 2022-11-14: 60 mg via INTRAVENOUS

## 2022-11-14 MED ORDER — POTASSIUM CHLORIDE IN NACL 20-0.9 MEQ/L-% IV SOLN: INTRAVENOUS | Status: DC

## 2022-11-14 MED ORDER — PROPOFOL 10 MG/ML IV BOLUS
INTRAVENOUS | Status: DC | PRN
Start: 2022-11-14 — End: 2022-11-14
  Administered 2022-11-14: 100 mg via INTRAVENOUS
  Administered 2022-11-14: 80 mg via INTRAVENOUS

## 2022-11-14 MED ORDER — DEXAMETHASONE 4 MG PO TABS
4.0000 mg | ORAL_TABLET | Freq: Four times a day (QID) | ORAL | Status: DC
  Administered 2022-11-15 (×2): 4 mg via ORAL
  Filled 2022-11-14 (×2): qty 1

## 2022-11-14 MED ORDER — DEXAMETHASONE SODIUM PHOSPHATE 10 MG/ML IJ SOLN
INTRAMUSCULAR | Status: DC | PRN
  Administered 2022-11-14: 5 mg via INTRAVENOUS

## 2022-11-14 SURGICAL SUPPLY — 58 items
APL SKNCLS STERI-STRIP NONHPOA (GAUZE/BANDAGES/DRESSINGS) ×1
BAG COUNTER SPONGE SURGICOUNT (BAG) ×1 IMPLANT
BAG SPNG CNTER NS LX DISP (BAG) ×1
BASKET BONE COLLECTION (BASKET) IMPLANT
BENZOIN TINCTURE PRP APPL 2/3 (GAUZE/BANDAGES/DRESSINGS) ×1 IMPLANT
BLADE BONE MILL MEDIUM (MISCELLANEOUS) IMPLANT
BLADE CLIPPER SURG (BLADE) IMPLANT
BONE FIBERS PLIAFX 10 (Bone Implant) ×1 IMPLANT
BUR CARBIDE MATCH 3.0 (BURR) ×1 IMPLANT
CANISTER SUCT 3000ML PPV (MISCELLANEOUS) ×1 IMPLANT
CNTNR URN SCR LID CUP LEK RST (MISCELLANEOUS) ×1 IMPLANT
CONT SPEC 4OZ STRL OR WHT (MISCELLANEOUS) ×1
COVER BACK TABLE 60X90IN (DRAPES) ×1 IMPLANT
DRAPE C-ARM 42X72 X-RAY (DRAPES) IMPLANT
DRAPE LAPAROTOMY 100X72X124 (DRAPES) ×1 IMPLANT
DRAPE SURG 17X23 STRL (DRAPES) ×1 IMPLANT
DRSG OPSITE POSTOP 4X6 (GAUZE/BANDAGES/DRESSINGS) IMPLANT
DURAPREP 26ML APPLICATOR (WOUND CARE) ×1 IMPLANT
ELECT REM PT RETURN 9FT ADLT (ELECTROSURGICAL) ×1
ELECTRODE REM PT RTRN 9FT ADLT (ELECTROSURGICAL) ×1 IMPLANT
EVACUATOR 1/8 PVC DRAIN (DRAIN) IMPLANT
GAUZE 4X4 16PLY ~~LOC~~+RFID DBL (SPONGE) IMPLANT
GLOVE BIO SURGEON STRL SZ7 (GLOVE) IMPLANT
GLOVE BIO SURGEON STRL SZ8 (GLOVE) ×2 IMPLANT
GLOVE BIOGEL PI IND STRL 7.0 (GLOVE) IMPLANT
GOWN STRL REUS W/ TWL LRG LVL3 (GOWN DISPOSABLE) IMPLANT
GOWN STRL REUS W/ TWL XL LVL3 (GOWN DISPOSABLE) ×2 IMPLANT
GOWN STRL REUS W/TWL 2XL LVL3 (GOWN DISPOSABLE) IMPLANT
GOWN STRL REUS W/TWL LRG LVL3 (GOWN DISPOSABLE)
GOWN STRL REUS W/TWL XL LVL3 (GOWN DISPOSABLE) ×2
GRAFT BNE FBR PLIAFX PRIME 10 (Bone Implant) IMPLANT
HEMOSTAT POWDER KIT SURGIFOAM (HEMOSTASIS) IMPLANT
KIT BASIN OR (CUSTOM PROCEDURE TRAY) ×1 IMPLANT
KIT TURNOVER KIT B (KITS) ×1 IMPLANT
MILL BONE PREP (MISCELLANEOUS) IMPLANT
NDL HYPO 25X1 1.5 SAFETY (NEEDLE) ×1 IMPLANT
NEEDLE HYPO 25X1 1.5 SAFETY (NEEDLE) ×1 IMPLANT
NS IRRIG 1000ML POUR BTL (IV SOLUTION) ×1 IMPLANT
PACK LAMINECTOMY NEURO (CUSTOM PROCEDURE TRAY) ×1 IMPLANT
PAD ARMBOARD 7.5X6 YLW CONV (MISCELLANEOUS) ×3 IMPLANT
PUTTY DBM 5CC (Putty) IMPLANT
ROD LORD LIPPED TI 5.5X60 (Rod) IMPLANT
SCREW CORT SHANK MOD 6.5X40 (Screw) IMPLANT
SCREW POLYAXIAL TULIP (Screw) IMPLANT
SET SCREW (Screw) ×6 IMPLANT
SET SCREW SPNE (Screw) IMPLANT
SOLUTION IRRIG SURGIPHOR (IV SOLUTION) ×1 IMPLANT
SPONGE SURGIFOAM ABS GEL 100 (HEMOSTASIS) ×1 IMPLANT
SPONGE T-LAP 4X18 ~~LOC~~+RFID (SPONGE) IMPLANT
STRIP CLOSURE SKIN 1/2X4 (GAUZE/BANDAGES/DRESSINGS) ×2 IMPLANT
SUT VIC AB 0 CT1 18XCR BRD8 (SUTURE) ×1 IMPLANT
SUT VIC AB 0 CT1 8-18 (SUTURE) ×1
SUT VIC AB 2-0 CP2 18 (SUTURE) ×1 IMPLANT
SUT VIC AB 3-0 SH 8-18 (SUTURE) ×2 IMPLANT
TOWEL GREEN STERILE (TOWEL DISPOSABLE) ×1 IMPLANT
TOWEL GREEN STERILE FF (TOWEL DISPOSABLE) ×1 IMPLANT
TRAY FOLEY MTR SLVR 16FR STAT (SET/KITS/TRAYS/PACK) IMPLANT
WATER STERILE IRR 1000ML POUR (IV SOLUTION) ×1 IMPLANT

## 2022-11-14 NOTE — Anesthesia Postprocedure Evaluation (Signed)
Anesthesia Post Note  Patient: RONDA CHIARELLA  Procedure(s) Performed: Exploration and Extension of Fusion, left hemifacetectomy and diskectomy L3-4, instrumented fusion L3-5 (Left: Back)     Patient location during evaluation: PACU Anesthesia Type: General Level of consciousness: awake and alert, patient cooperative and oriented Pain management: pain level controlled Vital Signs Assessment: post-procedure vital signs reviewed and stable Respiratory status: spontaneous breathing, nonlabored ventilation and respiratory function stable Cardiovascular status: blood pressure returned to baseline and stable Postop Assessment: no apparent nausea or vomiting and able to ambulate Anesthetic complications: no   No notable events documented.  Last Vitals:  Vitals:   11/14/22 1330 11/14/22 1345  BP: (!) 148/62 (!) 146/59  Pulse: 65 60  Resp: 16 12  Temp:  (!) 36.4 C  SpO2: 97% 96%    Last Pain:  Vitals:   11/14/22 1300  TempSrc:   PainSc: Asleep                 ,E. 

## 2022-11-14 NOTE — Progress Notes (Signed)
Placed patient on CPAP for the night via auto mode.Patient brought his own nasal pillows and tubing.

## 2022-11-14 NOTE — H&P (Signed)
Subjective: Patient is a 88 y.o. male admitted for L L3-4 HNP with radiculopathy. Onset of symptoms was a few months ago, gradually worsening since that time.  The pain is rated intense, and is located at the across the lower back and radiates to L Leg. The pain is described as aching and occurs all day. The symptoms have been progressive. Symptoms are exacerbated by exercise, standing, and walking for more than 1 minutes. MRI or CT showed large HNP L3-4 L above previous L4-5 fusion   Past Medical History:  Diagnosis Date   Allergy    Arthritis    Cancer (HCC)    prostate CA 1997, skin CA   Cataract    Colon polyp    COVID 2021   mild   Dysrhythmia    PACs   History of colon polyps    adenoma;[facility]   History of prostate cancer    History of skin cancer    HTN (hypertension)    Neuromuscular disorder (HCC)    sciatic   Other and unspecified hyperlipidemia    PAC (premature atrial contraction)    Sleep apnea    uses CPAP    Past Surgical History:  Procedure Laterality Date   COLONOSCOPY     COLONOSCOPY W/ POLYPECTOMY  last 2013    X 2; [facility]   EYE SURGERY Bilateral 2021   HEMORROIDECTOMY     INCONTINENCE SURGERY     INSERTION OF MESH N/A 05/04/2020   Procedure: INSERTION OF MESH;  Surgeon: Abigail Miyamoto, MD;  Location: [facility];  Service: General;  Laterality: N/A;   LAMINECTOMY WITH POSTERIOR LATERAL ARTHRODESIS LEVEL 2 Bilateral 04/22/2022   Procedure: Laminectomy and Foraminotomy - Lumbar Three-Lumbar Four, Lumbar Four-Lumbar Five - bilateral, instrumented fusion Lumbar Three-Lumbar Five;  Surgeon: Tia Alert, MD;  Location: [facility];  Service: Neurosurgery;  Laterality: Bilateral;   PROSTATE SURGERY     PROSTATECTOMY  1997   Dr Earlene Plater   ROTATOR CUFF REPAIR      X 3(L twice); Dr Thomasena Edis   TOTAL KNEE ARTHROPLASTY     L; Dr Despina Hick   TOTAL KNEE REVISION Left 07/29/2020   Procedure: LEFT KNEE REVISION, POLYETHYLENE EXCHANGE;  Surgeon: Ollen Gross, MD;  Location: [facility];  Service: Orthopedics;  Laterality: Left;   UMBILICAL HERNIA REPAIR N/A 05/04/2020   Procedure: UMBILICAL HERNIA REPAIR WITH MESH;  Surgeon: Abigail Miyamoto, MD;  Location: [facility];  Service: General;  Laterality: N/A;  LMA    Prior to Admission medications   Medication Sig Start Date End Date Taking? Authorizing Provider  acetaminophen (TYLENOL) 500 MG tablet Take 500-1,000 mg by mouth every 6 (six) hours as needed for moderate pain. 05/27/21  Yes [provider]  acetaminophen (TYLENOL) 650 MG CR tablet Take 1,300 mg by mouth every 8 (eight) hours as needed for pain.   Yes [provider]  amLODipine (NORVASC) 10 MG tablet Take 1 tablet (10 mg total) by mouth daily. 05/22/13  Yes Pecola Lawless, MD  atorvastatin (LIPITOR) 20 MG tablet TAKE ONE AND ONE-HALF TABLETS DAILY 05/22/13  Yes Pecola Lawless, MD  benazepril (LOTENSIN) 40 MG tablet Take 1 tablet (40 mg total) by mouth daily. 05/22/13  Yes Pecola Lawless, MD  HYDROcodone-acetaminophen (NORCO/VICODIN) 5-325 MG tablet Take 1 tablet by mouth every 6 (six) hours. 10/27/22  Yes [provider]  methocarbamol (ROBAXIN) 500 MG tablet Take 1 tablet (500 mg total) by mouth every  6 (six) hours as needed for muscle spasms. 04/23/22  Yes Lisbeth Renshaw, MD  metoprolol (LOPRESSOR) 50 MG tablet 1/2 by mouth two times daily Patient taking differently: Take 25 mg by mouth 2 (two) times daily. 05/22/13  Yes Pecola Lawless, MD  cetirizine (ZYRTEC) 10 MG tablet Take 10 mg by mouth daily.    [provider]  fluticasone (FLONASE) 50 MCG/ACT nasal spray Place 1 spray into both nostrils daily as needed for allergies or rhinitis.    [provider]  ketoconazole (NIZORAL) 2 % cream Apply 1 Application topically 2 (two) times daily as needed for irritation.    [provider]  Melatonin 10 MG TABS Take 10 mg by mouth at bedtime as needed (sleep).     [provider]  meloxicam (MOBIC) 15 MG tablet Take 15 mg by mouth daily as needed for pain. 05/27/21   [provider]  Multiple Vitamin (MULTIVITAMIN) tablet Take 1 tablet by mouth daily.    [provider]   Allergies  Allergen Reactions   Cefdinir Nausea Only    Per PCP, causes upset stomach   Neo-Bacit-Poly-Lidocaine Rash   Neosporin + Pain Relief Max St [Neomy-Bacit-Polymyx-Pramoxine] Rash    Social History   Tobacco Use   Smoking status: Never   Smokeless tobacco: Never  Substance Use Topics   Alcohol use: Yes    Alcohol/week: 14.0 standard drinks of alcohol    Types: 14 Shots of liquor per week    Comment:  14 drinks /week    Family History  Problem Relation Age of Onset   Stroke Mother 62   Heart disease Father 50       ?MI   Heart disease Brother        CBAG in 75s   Dementia Brother        vascular dementia   Colon cancer Neg Hx    Stomach cancer Neg Hx    Diabetes Neg Hx    Cancer Neg Hx    Esophageal cancer Neg Hx    Rectal cancer Neg Hx    Sleep apnea Neg Hx      Review of Systems  Positive ROS: neg  All other systems have been reviewed and were otherwise negative with the exception of those mentioned in the HPI and as above.  Objective: Vital signs in last 24 hours: Temp:  [97.8 F (36.6 C)] 97.8 F (36.6 C) (08/12 0754) Pulse Rate:  [66] 66 (08/12 0754) Resp:  [18] 18 (08/12 0754) BP: (150)/(78) 150/78 (08/12 0754) SpO2:  [96 %] 96 % (08/12 0754) Weight:  [75.8 kg] 75.8 kg (08/12 0754)  General Appearance: Alert, cooperative, no distress, appears stated age Head: Normocephalic, without obvious abnormality, atraumatic Eyes: PERRL, conjunctiva/corneas clear, EOM's intact    Neck: Supple, symmetrical, trachea midline Back: Symmetric, no curvature, ROM normal, no CVA tenderness Lungs:  respirations unlabored Heart: Regular rate and rhythm Abdomen: Soft, non-tender Extremities: Extremities normal, atraumatic, no  cyanosis or edema Pulses: 2+ and symmetric all extremities Skin: Skin color, texture, turgor normal, no rashes or lesions  NEUROLOGIC:   Mental status: Alert and oriented x4,  no aphasia, good attention span, fund of knowledge, and memory Motor Exam - grossly normal Sensory Exam - grossly normal Reflexes: 1+ Coordination - grossly normal Gait - grossly normal Balance - grossly normal Cranial Nerves: I: smell Not tested  II: visual acuity  OS: nl    OD: nl  II: visual fields Full to confrontation  II: pupils Equal, round, reactive to light  III,VII: ptosis None  III,IV,VI: extraocular muscles  Full ROM  V: mastication Normal  V: facial light touch sensation  Normal  V,VII: corneal reflex  Present  VII: facial muscle function - upper  Normal  VII: facial muscle function - lower Normal  VIII: hearing Not tested  IX: soft palate elevation  Normal  IX,X: gag reflex Present  XI: trapezius strength  5/5  XI: sternocleidomastoid strength 5/5  XI: neck flexion strength  5/5  XII: tongue strength  Normal    Data Review Lab Results  Component Value Date   WBC 7.5 11/03/2022   HGB 13.2 11/03/2022   HCT 41.0 11/03/2022   MCV 92.3 11/03/2022   PLT 313 11/03/2022   Lab Results  Component Value Date   NA 140 11/03/2022   K 3.9 11/03/2022   CL 104 11/03/2022   CO2 27 11/03/2022   BUN 12 11/03/2022   CREATININE 0.68 11/03/2022   GLUCOSE 100 (H) 11/03/2022   Lab Results  Component Value Date   INR 1.0 11/03/2022    Assessment/Plan:  Estimated body mass index is 23.96 kg/m as calculated from the following:   Height as of this encounter: 5\' 10"  (1.778 m).   Weight as of this encounter: 75.8 kg. Patient admitted for LL/ L diskectomy and fusion L3-4. Patient has failed a reasonable attempt at conservative therapy.  I explained the condition and procedure to the patient and answered any questions.  Patient wishes to proceed with procedure as planned. Understands risks/  benefits and typical outcomes of procedure.   Tia Alert 11/14/2022 9:42 AM

## 2022-11-14 NOTE — Transfer of Care (Signed)
Immediate Anesthesia Transfer of Care Note  Patient: [name]  Procedure(s) Performed: Exploration and Extension of Fusion, left hemifacetectomy and diskectomy L3-4, instrumented fusion L3-5 (Left: Back)  Patient Location: PACU  Anesthesia Type:General  Level of Consciousness: awake, alert , and drowsy  Airway & Oxygen Therapy: Patient Spontanous Breathing  Post-op Assessment: Report given to RN, Post -op Vital signs reviewed and stable, and Patient moving all extremities X 4  Post vital signs: Reviewed and stable  Last Vitals:  Vitals Value Taken Time  BP 146/81 11/14/22 1243  Temp    Pulse 55 11/14/22 1244  Resp 14 11/14/22 1244  SpO2 94 % 11/14/22 1244  Vitals shown include unfiled device data.  Last Pain:  Vitals:   11/14/22 0818  TempSrc:   PainSc: 0-No pain         Complications: No notable events documented.

## 2022-11-14 NOTE — Op Note (Signed)
11/14/2022  12:41 PM  PATIENT:  [name]  88 y.o. male  PRE-OPERATIVE DIAGNOSIS: Adjacent level disc herniation L3-4 left with superior free fragment causing severe left L3 radiculopathy with weakness above previous L4-5 fusion  POST-OPERATIVE DIAGNOSIS:  same  PROCEDURE:   1. Decompressive lumbar laminectomy, hemi facetectomy and foraminotomies L3-4 left with discectomy to decompress the left L3 and L4 nerve root 2.  Exploration of fusion with removal of nonsegmental instrumentation L4-5 3. Posterior fixation L3-L5 inclusive using Alphatec cortical pedicle screws.  4. Intertransverse arthrodesis L3-4 bilaterally using morcellized autograft and allograft.  SURGEON:  Marikay Alar, MD  ASSISTANTS: Verlin Dike, FNP  ANESTHESIA:  General  EBL: 125 ml  Total I/O In: 1000 [I.V.:1000] Out: 1025 [Urine:900; Blood:125]  BLOOD ADMINISTERED:none  DRAINS: none   INDICATION FOR PROCEDURE: This patient presented with left leg pain in an L3 distribution with a leg weakness. Imaging revealed an adjacent level disc herniation L3-4 on the left with a large superior free fragment compressing the L3 nerve root.  He had undergone a previous L4-5 fusion and L3-4 and L2-3 laminectomy.  The patient tried a reasonable attempt at conservative medical measures without relief. I recommended decompression and instrumented fusion to address the stenosis as well as the segmental  instability.  Patient understood the risks, benefits, and alternatives and potential outcomes and wished to proceed.  PROCEDURE DETAILS:  The patient was brought to the operating room. After induction of generalized endotracheal anesthesia the patient was rolled into the prone position on chest rolls and all pressure points were padded. The patient's lumbar region was cleaned and then prepped with DuraPrep and draped in the usual sterile fashion. Anesthesia was injected and then a dorsal midline incision was made and carried down  to the lumbosacral fascia. The fascia was opened and the paraspinous musculature was taken down in a subperiosteal fashion to expose L2-3 L3-4 and L4-5 as well as the previously placed instrumentation.  The locking caps and L4-5 were removed and the rods were removed.  The screws had excellent purchase.. A self-retaining retractor was placed. Intraoperative fluoroscopy confirmed my level, and I started with placement of the L3 cortical pedicle screws. The pedicle screw entry zones were identified utilizing surface landmarks and  AP and lateral fluoroscopy. I scored the cortex with the high-speed drill and then used the hand drill to drill an upward and outward direction into the pedicle. I then tapped line to line. I then placed a 6.5 x 40 mm cortical pedicle screw into the pedicles of L3 bilaterally.    I then turned my attention to the decompression and complete lumbar laminectomies, hemi- facetectomies, and foraminotomies were performed at L3-4 on the left.  My nurse practitioner was directly involved in the decompression and exposure of the neural elements. The yellow ligament was removed to expose the underlying dura and nerve roots, and generous foraminotomy was performed to adequately decompress the neural elements. Both the exiting and traversing nerve roots were decompressed until a coronary dilator passed easily along the nerve roots.  It was a large superior free fragment sitting up under the L3 nerve root.  This was removed with a nerve hook and coronary dilator and suction and pituitary rongeur's.      We then decorticated the transverse processes and laid a mixture of morcellized autograft and allograft out over these to perform intertransverse arthrodesis at L3-4 bilaterally. We then placed lordotic rods into the multiaxial screw heads of the pedicle screws and locked  these in position with the locking caps and anti-torque device. We then checked our construct with AP and lateral fluoroscopy.  Irrigated with copious amounts of 0.5% povidone iodine solution followed by saline solution. Inspected the nerve roots once again to assure adequate decompression, lined to the dura with Gelfoam,  and then we closed the muscle and the fascia with 0 Vicryl. Closed the subcutaneous tissues with 2-0 Vicryl and subcuticular tissues with 3-0 Vicryl. The skin was closed with benzoin and Steri-Strips. Dressing was then applied, the patient was awakened from general anesthesia and transported to the recovery room in stable condition. At the end of the procedure all sponge, needle and instrument counts were correct.   PLAN OF CARE: admit to inpatient  PATIENT DISPOSITION:  PACU - hemodynamically stable.   Delay start of Pharmacological VTE agent (>24hrs) due to surgical blood loss or risk of bleeding:  yes

## 2022-11-14 NOTE — Anesthesia Procedure Notes (Signed)
Procedure Name: Intubation Date/Time: 11/14/2022 10:22 AM  Performed by: Aundria Rud, CRNAPre-anesthesia Checklist: Patient identified, Emergency Drugs available, Suction available and Patient being monitored Patient Re-evaluated:Patient Re-evaluated prior to induction Oxygen Delivery Method: Circle System Utilized Preoxygenation: Pre-oxygenation with 100% oxygen Induction Type: IV induction Ventilation: Mask ventilation without difficulty Laryngoscope Size: Mac and 4 Grade View: Grade I Tube type: Oral Tube size: 7.5 mm Number of attempts: 1 Airway Equipment and Method: Stylet Placement Confirmation: ETT inserted through vocal cords under direct vision, positive ETCO2 and breath sounds checked- equal and bilateral Secured at: 21 cm Tube secured with: Tape Dental Injury: Teeth and Oropharynx as per pre-operative assessment

## 2022-11-15 ENCOUNTER — Encounter (HOSPITAL_COMMUNITY): Payer: Self-pay | Admitting: Neurological Surgery

## 2022-11-15 MED ORDER — HYDROCODONE-ACETAMINOPHEN 5-325 MG PO TABS: 1.0000 | ORAL_TABLET | Freq: Four times a day (QID) | ORAL | 0 refills | Status: AC

## 2022-11-15 NOTE — Discharge Summary (Signed)
Physician Discharge Summary  Patient ID: SARVIN KRUPA MRN: [id number] DOB/AGE: 1935/02/19 88 y.o.  Admit date: 11/14/2022 Discharge date: 11/15/2022  Admission Diagnoses: adjacent level disease with HNP L3-4, radiculopathy    Discharge Diagnoses: same   Discharged Condition: good  Hospital Course: The patient was admitted on 11/14/2022 and taken to the operating room where the patient underwent LL/ fusion L3-4. The patient tolerated the procedure well and was taken to the recovery room and then to the floor in stable condition. The hospital course was routine. There were no complications. The wound remained clean dry and intact. Pt had appropriate back soreness. No complaints of leg pain or new N/T/W. The patient remained afebrile with stable vital signs, and tolerated a regular diet. The patient continued to increase activities, and pain was well controlled with oral pain medications.   Consults: None  Significant Diagnostic Studies:  Results for orders placed or performed during the hospital encounter of 11/03/22  Surgical pcr screen   Specimen: Nasal Mucosa; Nasal Swab  Result Value Ref Range   MRSA, PCR NEGATIVE NEGATIVE   Staphylococcus aureus NEGATIVE NEGATIVE  Basic metabolic panel per protocol  Result Value Ref Range   Sodium 140 135 - 145 mmol/L   Potassium 3.9 3.5 - 5.1 mmol/L   Chloride 104 98 - 111 mmol/L   CO2 27 22 - 32 mmol/L   Glucose, Bld 100 (H) 70 - 99 mg/dL   BUN 12 8 - 23 mg/dL   Creatinine, Ser 6.57 0.61 - 1.24 mg/dL   Calcium 9.1 8.9 - 84.6 mg/dL   GFR, Estimated >96 >29 mL/min   Anion gap 9 5 - 15  CBC per protocol  Result Value Ref Range   WBC 7.5 4.0 - 10.5 K/uL   RBC 4.44 4.22 - 5.81 MIL/uL   Hemoglobin 13.2 13.0 - 17.0 g/dL   HCT 52.8 41.3 - 24.4 %   MCV 92.3 80.0 - 100.0 fL   MCH 29.7 26.0 - 34.0 pg   MCHC 32.2 30.0 - 36.0 g/dL   RDW 01.0 27.2 - 53.6 %   Platelets 313 150 - 400 K/uL   nRBC 0.0 0.0 - 0.2 %  Protime-INR  Result Value Ref  Range   Prothrombin Time 13.0 11.4 - 15.2 seconds   INR 1.0 0.8 - 1.2  Type and screen [facility]  Result Value Ref Range   ABO/RH(D) O POS    Antibody Screen NEG    Sample Expiration 11/17/2022,2359    Extend sample reason      NO TRANSFUSIONS OR PREGNANCY IN THE PAST 3 MONTHS Performed at [facility], [address] [place], [place] [address]     DG Lumbar Spine 2-3 Views  Result Date: 11/14/2022 CLINICAL DATA:  Elective surgery. Exploration and extension of fusion, left hemi facetectomy and discectomy L3-4. Instrumented fusion L3-5. EXAM: LUMBAR SPINE - 2-3 VIEW COMPARISON:  CT 09/19/2022 FINDINGS: Intraoperative fluoroscopy is obtained for surgical control purposes. Fluoroscopy time is recorded at 31 seconds. Dose 19.57 mGy. Two spot fluoroscopic images are obtained. Spot fluoroscopic images obtained intraoperatively demonstrate placement of pedicular screws at L3, L4, and L5 levels. IMPRESSION: Intraoperative fluoroscopy obtained for surgical control purposes. Electronically Signed   By: Burman Nieves M.D.   On: 11/14/2022 17:25   DG C-Arm 1-60 Min-No Report  Result Date: 11/14/2022 Fluoroscopy was utilized by the requesting physician.  No radiographic interpretation.   DG C-Arm 1-60 Min-No Report  Result Date: 11/14/2022 Fluoroscopy  was utilized by the requesting physician.  No radiographic interpretation.    Antibiotics:  Anti-infectives (From admission, onward)    Start     Dose/Rate Route Frequency Ordered Stop   11/14/22 1600  ceFAZolin (ANCEF) IVPB 2g/100 mL premix        2 g 200 mL/hr over 30 Minutes Intravenous Every 8 hours 11/14/22 1431 11/15/22 0205   11/14/22 0800  vancomycin (VANCOCIN) IVPB 1000 mg/200 mL premix        1,000 mg 200 mL/hr over 60 Minutes Intravenous On call to O.R. 11/14/22 0746 11/14/22 0923       Discharge Exam: Blood pressure (!) 145/62, pulse (!) 53, temperature 98.1 F (36.7 C), temperature source Oral,  resp. rate 18, height 5\' 10"  (1.778 m), weight 75.8 kg, SpO2 96%. Neurologic: Grossly normal Incision CDI  Discharge Medications:   Allergies as of 11/15/2022       Reactions   Cefdinir Nausea Only   Per PCP, causes upset stomach   Neo-bacit-poly-lidocaine Rash   Neosporin + Pain Relief Max St [neomy-bacit-polymyx-pramoxine] Rash        Medication List     TAKE these medications    acetaminophen 650 MG CR tablet Commonly known as: TYLENOL Take 1,300 mg by mouth every 8 (eight) hours as needed for pain.   acetaminophen 500 MG tablet Commonly known as: TYLENOL Take 500-1,000 mg by mouth every 6 (six) hours as needed for moderate pain.   amLODipine 10 MG tablet Commonly known as: NORVASC Take 1 tablet (10 mg total) by mouth daily.   atorvastatin 20 MG tablet Commonly known as: LIPITOR TAKE ONE AND ONE-HALF TABLETS DAILY   benazepril 40 MG tablet Commonly known as: LOTENSIN Take 1 tablet (40 mg total) by mouth daily.   cetirizine 10 MG tablet Commonly known as: ZYRTEC Take 10 mg by mouth daily.   fluticasone 50 MCG/ACT nasal spray Commonly known as: FLONASE Place 1 spray into both nostrils daily as needed for allergies or rhinitis.   HYDROcodone-acetaminophen 5-325 MG tablet Commonly known as: NORCO/VICODIN Take 1 tablet by mouth every 6 (six) hours.   ketoconazole 2 % cream Commonly known as: NIZORAL Apply 1 Application topically 2 (two) times daily as needed for irritation.   Melatonin 10 MG Tabs Take 10 mg by mouth at bedtime as needed (sleep).   meloxicam 15 MG tablet Commonly known as: MOBIC Take 15 mg by mouth daily as needed for pain.   methocarbamol 500 MG tablet Commonly known as: ROBAXIN Take 1 tablet (500 mg total) by mouth every 6 (six) hours as needed for muscle spasms.   metoprolol tartrate 50 MG tablet Commonly known as: LOPRESSOR 1/2 by mouth two times daily What changed:  how much to take how to take this when to take  this additional instructions   multivitamin tablet Take 1 tablet by mouth daily.               Durable Medical Equipment  (From admission, onward)           Start     Ordered   11/14/22 1431  DME Walker rolling  Once       Question:  Patient needs a walker to treat with the following condition  Answer:  S/P lumbar fusion   11/14/22 1431   11/14/22 1431  DME 3 n 1  Once        11/14/22 1431            Disposition: home  Final Dx: LL/ fusion L3-4  Discharge Instructions      Remove dressing in 72 hours   Complete by: As directed    Call MD for:  difficulty breathing, headache or visual disturbances   Complete by: As directed    Call MD for:  persistant nausea and vomiting   Complete by: As directed    Call MD for:  redness, tenderness, or signs of infection (pain, swelling, redness, odor or green/yellow discharge around incision site)   Complete by: As directed    Call MD for:  severe uncontrolled pain   Complete by: As directed    Call MD for:  temperature >100.4   Complete by: As directed    Diet - low sodium heart healthy   Complete by: As directed    Discharge instructions   Complete by: As directed    No heavy lifting or exercise other than walking, may shower, no driving   Increase activity slowly   Complete by: As directed           Signed: Tia Alert 11/15/2022, 8:03 AM

## 2022-11-15 NOTE — Progress Notes (Signed)
Patient alert and oriented, void, ambulate, surgical site clean and dry, d/c instructions explain and given. All questions answered. Patient d/c home per order.

## 2022-11-15 NOTE — Evaluation (Signed)
Physical Therapy Evaluation  Patient Details Name: [name] MRN: [id number] DOB: 1935/02/22 Today's Date: 11/15/2022  History of Present Illness  Pt is an 88 y/o male who prsents s/p exploration and extension of fusion, L hemifacetectomy and diskectomy L3-4 instrumented fusion L3-5 on 11/14/22. PMH arthritis, prostate ca, cataracts, colon plyp, dysrhythmia, HTN, PAC, sleep apnea Rotator cuff repair, bil LTKA, prior back surg   Clinical Impression  Pt admitted with above diagnosis. At the time of PT eval, pt was able to demonstrate transfers with supervision to modified independence and ambulation with gross CGA and RW for support. Pt was educated on precautions, brace application/wearing schedule, appropriate activity progression, and car transfer. Pt currently with functional limitations due to the deficits listed below (see PT Problem List). Pt will benefit from skilled PT to increase their independence and safety with mobility to allow discharge to the venue listed below.          If plan is discharge home, recommend the following: A little help with walking and/or transfers;A little help with bathing/dressing/bathroom;Assistance with cooking/housework;Help with stairs or ramp for entrance;Assist for transportation   Can travel by private vehicle        Equipment Recommendations None recommended by PT  Recommendations for Other Services       Functional Status Assessment Patient has had a recent decline in their functional status and demonstrates the ability to make significant improvements in function in a reasonable and predictable amount of time.     Precautions / Restrictions Precautions Precautions: Back Precaution Booklet Issued: Yes (comment) Precaution Comments: Reviewed handout with pt and family and pt was cued for precautions during functional mobility. Required Braces or Orthoses: Spinal Brace Spinal Brace: Lumbar corset;Applied in sitting  position Restrictions Weight Bearing Restrictions: No      Mobility  Bed Mobility Overal bed mobility: Modified Independent             General bed mobility comments: HOB flat and rails lowered to simulate home environment. No assist required and pt was able to demonstrate a good log roll technique.    Transfers Overall transfer level: Needs assistance Equipment used: Rolling walker (2 wheels) Transfers: Sit to/from Stand Sit to Stand: Supervision           General transfer comment: VC's for hand placement on seated surface for safety and for improved posture throughout transfers.    Ambulation/Gait Ambulation/Gait assistance: Contact guard assist Gait Distance (Feet): 300 Feet Assistive device: Rolling walker (2 wheels) Gait Pattern/deviations: Step-through pattern, Decreased stride length, Trunk flexed Gait velocity: Decreased Gait velocity interpretation: 1.31 - 2.62 ft/sec, indicative of limited community ambulator   General Gait Details: VC's for improved posture, closer walker proximity and forward gaze. No assist required but hands on guarding provided for safety.  Stairs Stairs: Yes Stairs assistance: Contact guard assist Stair Management: One rail Left, Step to pattern, Forwards Number of Stairs: 2 General stair comments: VC's for sequencing and general safety.  Wheelchair Mobility     Tilt Bed    Modified Rankin (Stroke Patients Only)       Balance Overall balance assessment: Mild deficits observed, not formally tested                                           Pertinent Vitals/Pain Pain Assessment Pain Assessment: Faces Faces Pain Scale: Hurts a little bit Pain Location:  back Pain Descriptors / Indicators: Guarding, Operative site guarding Pain Intervention(s): Limited activity within patient's tolerance, Monitored during session, Repositioned    Home Living Family/patient expects to be discharged to:: Private  residence Living Arrangements: Spouse/significant other Available Help at Discharge: Family Type of Home: House Home Access: Stairs to enter Entrance Stairs-Rails: None Entrance Stairs-Number of Steps: 2   Home Layout: One level Home Equipment: Agricultural consultant (2 wheels);Grab bars - tub/shower;Hand held shower head;Shower seat;Cane - single point;BSC/3in1;Grab bars - toilet;Adaptive equipment;Lift chair (lift chair is new) Additional Comments: house was remodeled with widen doorways for easy access. small dog 17 yr that wife helps care for and pt does eye drops on the couch after wife picks the dog up    Prior Function Prior Level of Function : Independent/Modified Independent;Driving             Mobility Comments: mobilized with rollator in the main portion of the house and uses a RW in the bedroom ADLs Comments: Pt was independent     Extremity/Trunk Assessment   Upper Extremity Assessment Upper Extremity Assessment: Overall WFL for tasks assessed    Lower Extremity Assessment Lower Extremity Assessment: Generalized weakness (Mild; consistent wiht pre-op diagnosis)    Cervical / Trunk Assessment Cervical / Trunk Assessment: Back Surgery;Kyphotic  Communication   Communication Communication: No apparent difficulties  Cognition Arousal: Alert Behavior During Therapy: WFL for tasks assessed/performed Overall Cognitive Status: Within Functional Limits for tasks assessed                                          General Comments      Exercises Other Exercises Other Exercises: scapular retraction/depression as part of posture improvement/correction. x10 scapular retractions seated EOB.   Assessment/Plan    PT Assessment Patient needs continued PT services  PT Problem List Decreased strength;Decreased activity tolerance;Decreased balance;Decreased mobility;Decreased knowledge of use of DME;Decreased safety awareness;Decreased knowledge of  precautions;Pain       PT Treatment Interventions DME instruction;Gait training;Stair training;Functional mobility training;Therapeutic activities;Therapeutic exercise;Balance training;Cognitive remediation;Patient/family education    PT Goals (Current goals can be found in the Care Plan section)  Acute Rehab PT Goals Patient Stated Goal: Home today PT Goal Formulation: With patient/family Time For Goal Achievement: 11/22/22 Potential to Achieve Goals: Good    Frequency Min 1X/week     Co-evaluation               AM-PAC PT "6 Clicks" Mobility  Outcome Measure Help needed turning from your back to your side while in a flat bed without using bedrails?: None Help needed moving from lying on your back to sitting on the side of a flat bed without using bedrails?: None Help needed moving to and from a bed to a chair (including a wheelchair)?: A Little Help needed standing up from a chair using your arms (e.g., wheelchair or bedside chair)?: A Little Help needed to walk in hospital room?: A Little Help needed climbing 3-5 steps with a railing? : A Little 6 Click Score: 20    End of Session Equipment Utilized During Treatment: Gait belt;Back brace Activity Tolerance: Patient tolerated treatment well Patient left: in bed;with call bell/phone within reach;with family/visitor present Nurse Communication: Mobility status PT Visit Diagnosis: Unsteadiness on feet (R26.81);Pain Pain - part of body:  (back)    Time: 0940-1001 PT Time Calculation (min) (ACUTE ONLY): 21 min  Charges:   PT Evaluation $PT Eval Low Complexity: 1 Low   PT General Charges $$ ACUTE PT VISIT: 1 Visit         Conni Slipper, PT, DPT Acute Rehabilitation Services Secure Chat Preferred Office: [phone]   Marylynn Pearson 11/15/2022, 11:24 AM

## 2022-11-15 NOTE — Plan of Care (Signed)

## 2022-11-15 NOTE — Evaluation (Signed)
Occupational Therapy Evaluation Patient Details Name: [name] MRN: [id number] DOB: 1934-04-26 Today's Date: 11/15/2022   History of Present Illness 88 yo male s/p 8/12 exploration and extension of fusion L hemifacetectomy and diskectomy L3-4 instrumented fusion L3-5 PMH arthritis, prostate ca, cataracts, colon plyp, dysrhythmia, HTN, PAC, sleep apnea Rotator cuff repair bil  LTKA  back surg   Clinical Impression   Patient evaluated by Occupational Therapy with no further acute OT needs identified. All education has been completed and the patient has no further questions. See below for any follow-up Occupational Therapy or equipment needs. OT to sign off. Thank you for referral.         If plan is discharge home, recommend the following:      Functional Status Assessment  Patient has had a recent decline in their functional status and demonstrates the ability to make significant improvements in function in a reasonable and predictable amount of time.  Equipment Recommendations  None recommended by OT    Recommendations for Other Services       Precautions / Restrictions Precautions Precautions: Back Precaution Comments: back precautions reviewd and handout provided Required Braces or Orthoses: Spinal Brace Spinal Brace: Thoracolumbosacral orthotic;Applied in sitting position Restrictions Weight Bearing Restrictions: No      Mobility Bed Mobility               General bed mobility comments: sitting eob on arrival    Transfers Overall transfer level: Needs assistance Equipment used: Rolling walker (2 wheels) Transfers: Sit to/from Stand Sit to Stand: Supervision           General transfer comment: cues for hand placement and reaching for surfaces      Balance Overall balance assessment: Mild deficits observed, not formally tested                                         ADL either performed or assessed with clinical judgement   ADL  Overall ADL's : Modified independent                                       General ADL Comments: pt dressing with for home with figure 4 cross to don shorts. pt educated to dress LLE first. pt educated on toileting with brace and shorts. pt reports wearing the brace less at home than "i probably should have". pt education on sit<>stand with hand placement. pt has BSC over toilet for arm rest. wife educated on use of beach towels to increase surface heights as needed  Back handout provided and reviewed adls in detail. Pt educated on: clothing between brace, never sleep in brace, set an alarm at night for medication, avoid sitting for long periods of time, correct bed positioning for sleeping, correct sequence for bed mobility, avoiding lifting more than 5 pounds and never wash directly over incision. All education is complete and patient indicates understanding.    Vision Baseline Vision/History: 1 Wears glasses Ability to See in Adequate Light: 1 Impaired Patient Visual Report: No change from baseline Vision Assessment?: No apparent visual deficits     Perception         Praxis         Pertinent Vitals/Pain Pain Assessment Pain Assessment: Faces Faces Pain Scale: Hurts a little bit Pain Location:  back Pain Descriptors / Indicators: Guarding, Operative site guarding Pain Intervention(s): Monitored during session, Premedicated before session, Repositioned, Limited activity within patient's tolerance     Extremity/Trunk Assessment Upper Extremity Assessment Upper Extremity Assessment: Overall WFL for tasks assessed   Lower Extremity Assessment Lower Extremity Assessment: Overall WFL for tasks assessed   Cervical / Trunk Assessment Cervical / Trunk Assessment: Back Surgery   Communication Communication Communication: No apparent difficulties   Cognition Arousal: Alert Behavior During Therapy: WFL for tasks assessed/performed Overall Cognitive Status: Within  Functional Limits for tasks assessed                                       General Comments  incision dry and intact. family educated to help with dressing removal as the discharge instructions indicate    Exercises     Shoulder Instructions      Home Living Family/patient expects to be discharged to:: Private residence Living Arrangements: Spouse/significant other Available Help at Discharge: Family Type of Home: House Home Access: Stairs to enter Secretary/administrator of Steps: 2 Entrance Stairs-Rails: None Home Layout: One level     Bathroom Shower/Tub: Producer, television/film/video: Handicapped height Bathroom Accessibility: Yes How Accessible: Accessible via walker Home Equipment: Rolling Walker (2 wheels);Grab bars - tub/shower;Hand held shower head;Shower seat;Cane - single point;BSC/3in1;Grab bars - toilet;Adaptive equipment;Lift chair (lift chair is new) Adaptive Equipment: Reacher Additional Comments: house was remodeled with widen doorways for easy access. small dog 17 yr that wife helps care for and pt does eye drops on the couch after wife picks the dog up      Prior Functioning/Environment Prior Level of Function : Independent/Modified Independent;Driving             Mobility Comments: mobilized with rollator in the main portion of the house and uses a RW in teh bedroom          OT Problem List:        OT Treatment/Interventions:      OT Goals(Current goals can be found in the care plan section) Acute Rehab OT Goals Patient Stated Goal: to return home  OT Frequency:      Co-evaluation              AM-PAC OT "6 Clicks" Daily Activity     Outcome Measure Help from another person eating meals?: None Help from another person taking care of personal grooming?: None Help from another person toileting, which includes using toliet, bedpan, or urinal?: None Help from another person bathing (including washing, rinsing,  drying)?: None Help from another person to put on and taking off regular upper body clothing?: None Help from another person to put on and taking off regular lower body clothing?: None 6 Click Score: 24   End of Session Equipment Utilized During Treatment: Rolling walker (2 wheels) Nurse Communication: Mobility status  Activity Tolerance: Patient tolerated treatment well Patient left: in bed;with family/visitor present  OT Visit Diagnosis: Unsteadiness on feet (R26.81)                Time: 1610-9604 OT Time Calculation (min): 23 min Charges:  OT General Charges $OT Visit: 1 Visit OT Evaluation $OT Eval Moderate Complexity: 1 Mod   Brynn, OTR/L  Acute Rehabilitation Services Office: [phone] .   Mateo Flow 11/15/2022, 10:18 AM

## 2022-12-18 IMAGING — NM NM BONE 3 PHASE
10 series · 20 of 20 positions shown · non-contrast
Comparison: None.

CLINICAL DATA: Left total knee arthroplasty 5645, chronic left knee
pain x6 months.

EXAM:
NUCLEAR MEDICINE 3-PHASE BONE SCAN
TECHNIQUE: Radionuclide angiographic images, immediate static blood pool
images, and 3-hour delayed static images were obtained of the knees
bilaterally after intravenous injection of radiopharmaceutical.
RADIOPHARMACEUTICALS:  20.3 mCi Ic-44m MDP IV

[Series 1: flow · 2.07mm/px · 6 of 48 frames shown (1 of 2)]
[frame 5/48]
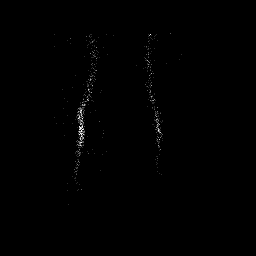
[frame 13/48  full-range]
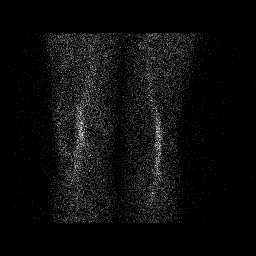
[frame 21/48  full-range]
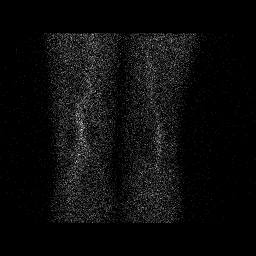
[frame 29/48  full-range]
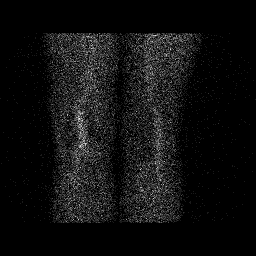
[frame 37/48  full-range]
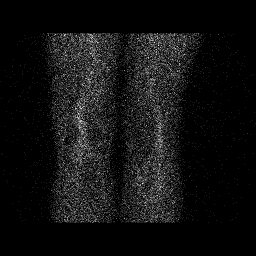
[frame 45/48  full-range]
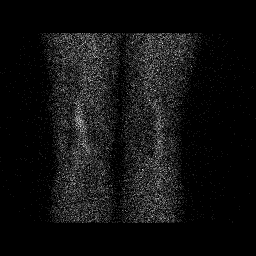

[Series 1: delay · delayed · 2.07mm/px · 1 of 1 slices shown (1 of 4)]
[im 1/1]
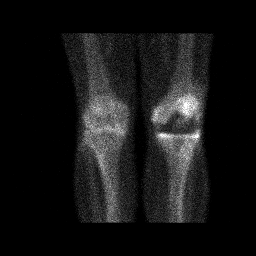

[Series 1: delay · delayed · 2.07mm/px · 1 of 1 slices shown (2 of 4)]
[im 1/1]
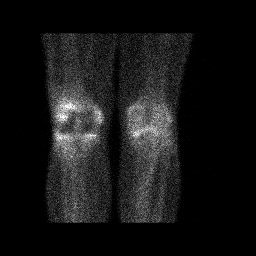

[Series 1: flow · 2.07mm/px · 6 of 48 frames shown (2 of 2)]
[frame 5/48]
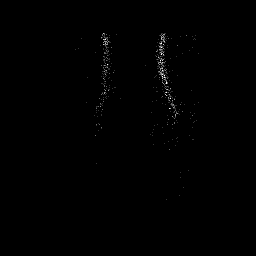
[frame 13/48  full-range]
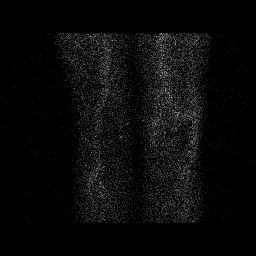
[frame 21/48  full-range]
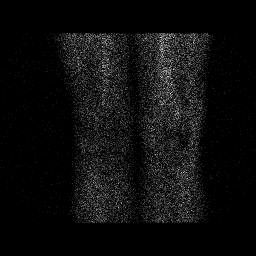
[frame 29/48  full-range]
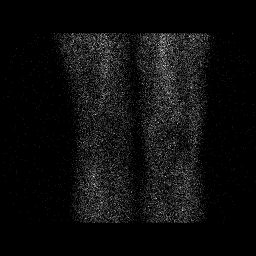
[frame 37/48  full-range]
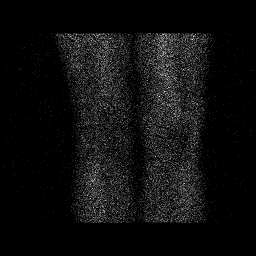
[frame 45/48  full-range]
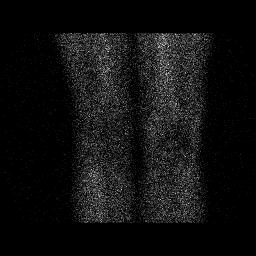

[Series 2: delay · delayed · 2.07mm/px · 1 of 1 slices shown (3 of 4)]
[im 1/1  full-range]
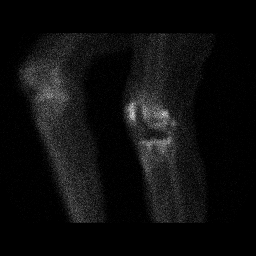

[Series 2: blood pool · 2.07mm/px · 1 of 1 slices shown (1 of 2)]
[im 1/1  full-range]
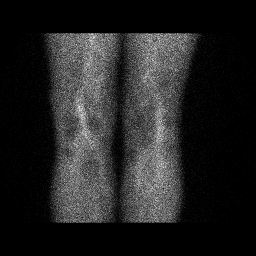

[Series 2: blood pool · 2.07mm/px · 1 of 1 slices shown (2 of 2)]
[im 1/1  full-range]
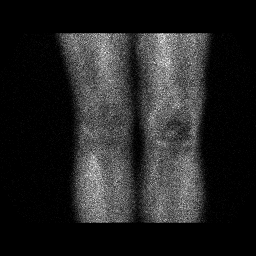

[Series 2: delay · delayed · 2.07mm/px · 1 of 1 slices shown (4 of 4)]
[im 1/1  full-range]
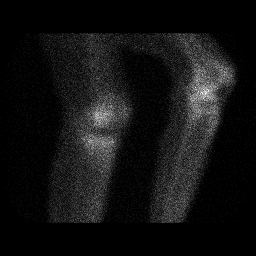

[Series 3: lat bp · 2.07mm/px · 1 of 1 slices shown (1 of 2)]
[im 1/1  full-range]
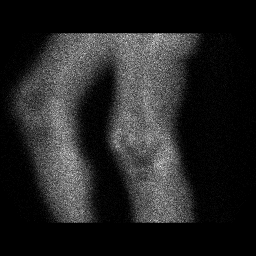

[Series 3: lat bp · 2.07mm/px · 1 of 1 slices shown (2 of 2)]
[im 1/1  full-range]
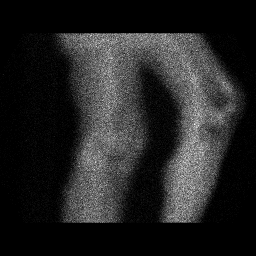

[20 of 20 positions shown; findings below may reference images not displayed]

FINDINGS: Vascular phase: Symmetric bilateral perfusion

Blood pool phase: No asymmetric hyperemia. Defect related to left
total knee arthroplasty noted.

Delayed phase: There is uniform mild uptake involving the left knee
surrounding the arthroplasty components, within normal limits
following knee arthroplasty. Mild uptake within the right knee
preferentially along the articular surface is likely degenerative in
nature.
IMPRESSION: Normal examination post arthroplasty. No evidence of aseptic
loosening or infection.

Mild right knee degenerative arthritis.

## 2023-05-23 ENCOUNTER — Ambulatory Visit: Payer: Medicare Other | Admitting: Neurology

## 2023-05-23 ENCOUNTER — Encounter: Payer: Self-pay | Admitting: Neurology

## 2023-05-23 VITALS — BP 135/76 | HR 62 | Ht 70.0 in | Wt 178.0 lb

## 2023-05-23 DIAGNOSIS — G629 Polyneuropathy, unspecified: Secondary | ICD-10-CM | POA: Diagnosis not present

## 2023-05-23 DIAGNOSIS — R202 Paresthesia of skin: Secondary | ICD-10-CM | POA: Diagnosis not present

## 2023-05-23 DIAGNOSIS — R200 Anesthesia of skin: Secondary | ICD-10-CM

## 2023-05-23 DIAGNOSIS — G4733 Obstructive sleep apnea (adult) (pediatric): Secondary | ICD-10-CM

## 2023-05-23 DIAGNOSIS — Z789 Other specified health status: Secondary | ICD-10-CM

## 2023-05-23 NOTE — Patient Instructions (Addendum)
You may have a condition called peripheral neuropathy, i. e. nerve damage. Unfortunately, as I mentioned, there is no specific treatment for most neuropathies. The most common cause for neuropathy is diabetes in this [place], in which case, tight glucose control is key.  Some studies suggest that obesity and even prediabetes can also cause nerve damage even in the absence of a formal diagnosis of diabetes. Most nerve damage is not reversible.   Other causes include thyroid disease, and some vitamin deficiencies. Certain medications such as chemotherapy agents and other chemicals or toxins including alcohol can cause neuropathy. There are some genetic conditions or hereditary neuropathies. Typically patients will report a family history of neuropathy in those conditions. There are cases associated with cancers and autoimmune conditions. Most neuropathies are progressive unless a root cause can be found and treated, which is rare, as I explained. For most neuropathies there is no actual cure or reversing of symptoms. Painful neuropathy can be difficult to treat symptomatically, but there are some medications available to ease the symptoms.  Thankfully, you have no significant pain symptoms at this time and can be monitored for symptoms.    I recommend you reduce and possibly eliminate your daily alcohol consumption.  Continue with physical therapy. Use a cane or walker for gait stability.  We may consider repeating your electrical nerve and muscle test through our office in the future.    For now, as discussed, we will proceed with blood work today and call you with the test results.  I am worried about your driving. I am not sure that you are at this point fully safe to drive. Please discuss with your PCP and Dr. Yetta Barre as well.

## 2023-05-23 NOTE — Progress Notes (Signed)
Subjective:    Patient ID: [name] is a 89 y.o. male.  HPI    Interim history:   Dear Dr. Yetta Barre,  I saw your patient, [name], upon your kind request in my neurologic clinic today for evaluation of his paresthesias, concern for peripheral neuropathy.  The patient is unaccompanied today.  As you know, [name] is an 89 year old male with an underlying medical history of prostate cancer, status post prostatectomy, arthritis with status post left total knee arthroplasty, lumbar spondylosis with status post lumbar laminectomy in January 2024, allergies, cataracts with status post surgeries, hypertension, PACs, hyperlipidemia and sleep apnea, who reports a 6+ month history of numbness feet.  He admits that symptoms may have been ongoing longer but he noticed progression in the past 6 months.  He has no pain, particularly, no stinging or burning sensation, no pins-and-needles sensation, just numbness.  It has affected his balance and it has affected his ability to drive he admits.  Sometimes he cannot feel the pedal as well.  He has not fallen in the past 6 months.  He tries to hydrate well but admits that he may drink about 12 to 24 ounces of water.  Some juice per day, 1 or 2 cups of coffee per day.  He drinks alcohol daily and has done so for years.  He drinks about 2 ounces of bourbon, 1-1.5 drinks per day on average.  He has regular blood work from his PCP.  Recent blood test results are not available for my review today.  He is not labeled as prediabetic or diabetic.  He has no family history of neuropathy as far as he knows.  I reviewed your office records including follow-up note from 05/18/2023.  He had sustained a wedge compression fracture of T11-T12 vertebra.  I reviewed your office visit note from 04/06/2023, as well as 02/23/2023.  He has sustained a compression fracture after a fall.  I reviewed your office visit note from 01/26/2023.  He had L3-L5 fusion.  He was found a wedge  compression fracture on 12/27/2022, I reviewed your office visit note.  I reviewed your office visit note from 10/20/2022, at which time he reported no significant improvement after epidural steroid injection.  He was on hydrocodone and Robaxin. I reviewed multiple additional office visits note from 09/23/2022, 09/13/2022, 09/07/2022, 08/02/2022, 06/02/2022, 05/05/2022, 04/26/2022.  Some of these office visits are with nurse practitioner, Verlin Dike.  He had an EMG and nerve conduction velocity test through your office on 05/09/2023, unfortunately, the copy is not very legible, clinical impression: Abnormal study.  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  Evidence of a chronic lumbosacral radiculopathy affecting the left L4-S1 nerve root, no evidence of active denervation.  No evidence of a common peroneal motor entrapment neuropathy at the bilateral fibular heads.  No evidence of a tibial motor entrapment neuropathy in the bilateral lower limbs.  Study was interpreted and conducted by Dr. Chales Salmon in your office. He has been followed in our sleep clinic for his obstructive sleep apnea for which he has been compliant with his CPAP of 10 cm.  He was last seen in our clinic by Butch Penny, NP on 08/01/2022.    I reviewed his current CPAP compliance data for the past 30 days and he has been 100% compliant on CPAP of 10 cm, residual AHI adequate at 4.5/h, leak on the higher side.  Average usage of 8  hours and 17 minutes.  Previously:  08/01/2022 Butch Penny, NP): <<[name] is a 89 y.o. male with a history of OSA on CPAP. Returns today for follow-up.  CPAP is working well for him.  He denies any new issues.  Download is below.>>  07/30/2021 Butch Penny, NP): <<[name] is an 89 year old male with a history of obstructive sleep apnea on CPAP.  He returns today for CPAP download after receiving a new machine.  His download is below.   Reports that he is using the nasal pillows.  Did not like the fullface.  >>  05/19/2021 Butch Penny, NP): <<[name] is a an 89 year old male with a history of obstructive sleep apnea on CPAP.  He returns today for follow-up.  He reports that the CPAP is working well for him.  He states that he is due for new machine and would like his replaced.  He returns today for evaluation.>>  05/20/2020 Butch Penny, NP): <<[name] is an 89 year old male with a history of obstructive sleep apnea on CPAP.  He reports that the CPAP works well for him.  He does note that he is a mouth breather and he is wearing the nasal pillows.  He states that his wife hears the air blowing out of his mouth.  He has used a chinstrap in the past but it does not work efficiently.  He is willing to try a different style of mask.  He does not like the full facemask.>>  05/20/2019 (SA): [name] is an 89 year old right-handed gentleman with an underlying medical history of hypertension, hyperlipidemia, degenerative joint disease, prostate cancer, status post radical prostatectomy 1997, history of skin cancer, allergic rhinitis, arthritis, status post left total knee arthroplasty under Dr. Despina Hick in 2012, and overweight state, who presents for follow-up consultation of his obstructive sleep apnea, on CPAP therapy. The patient is unaccompanied today and presents for his yearly checkup. I last saw him on 05/16/2018, at which time he was fully compliant with his CPAP, he was having some trouble with sleep maintenance.    I reviewed his CPAP compliance data from 04/16/2019 through 05/15/2019, which is a total of 30 days, during which time he used his machine every night with percent use days greater than 4 hours at 100%, indicating superb compliance with an average usage of 8 hours and 24 minutes, residual AHI at goal at 1.7/h, leak high with a 95th percentile at 49.3 L/min on a pressure of 10 cm.  He uses nasal pillows with a chinstrap, he  does not really care for the chinstrap but still tries it.  His wife is bothered by the air sound and from the air blowing, he is willing to try a different mask, he tried a traditional style full facemask in the past.  He unfortunately fell while playing golf, and broke his wrist in January 2021, he had surgery on 04/08/2019, he is in physical therapy and has a follow-up pending with Dr. Orlan Leavens.  He had an eye examination and was advised to consider cataract surgeries.  He also needs surgery for his umbilical hernia but has postponed it because of the Covid pandemic.  He received his first vaccine, he is due for his second shot on 06/03/2019.         I saw him on 05/10/2017, at which time he was fully compliant with his CPAP and doing well.   I reviewed his CPAP compliance data from 04/15/2018 through 05/14/2018 which is a total of  30 days, during which time he used his CPAP every night with percent used days greater than 4 hours at 100%, indicating superb compliance with an average usage of 8 hours and 13 minutes which is great as well. AHI at goal at 1.4 per hour, leak on the high side with the 95th percentile at 33.5 L/m on a pressure of 10 cm with EPR of 1.    I saw him on 03/10/2016, at which time he reported doing well with CPAP. She was sleeping reasonably well with it. He was fully compliant with treatment and advised to follow-up routinely in one year.    I reviewed his CPAP compliance data from 04/09/2017 through 05/08/2017 which is a total of 30 days, during which time he used his machine every night with percent used days greater than 4 hours at 100%, indicating superb compliance with an average usage of 7 hours and 31 minutes, residual AHI at goal at 2.1 per hour, leak on the high side for the 95th percentile at 30.6 L/m on a pressure of 10 cm with EPR of 1.    I saw him on 09/09/2015, at which time he reported that he was doing okay with CPAP. He had some discomfort with a nasal pillows and  did not like the chin strap. He had some nasal congestion. He had some mouth dryness. He was compliant with CPAP therapy.   I reviewed his CPAP compliance data from 02/08/2016 through 03/08/2016 which is a total of 30 days, during which time he used his machine every night with percent used days greater than 4 hours at 100%, indicating superb compliance with an average usage of 7 hours and 42 minutes, residual AHI 2 per hour, leak on the high side with the 95th percentile at 32.3 L/m on a pressure of 10 cm with EPR of 1.   I first met him on 05/26/2015 at the request of his primary care physician, at which time he reported snoring and excessive daytime somnolence as well as a family history of obstructive sleep apnea. He had a home sleep test on 06/11/2015 which showed severe obstructive sleep apnea, overall AHI was 34, oxygen desaturation nadir was 79%, time below 88% saturation was 3 hours and 46 minutes for the night. I invited him back for a full night CPAP titration study based on the severity of his test results from the home sleep test. He had a CPAP titration study on 07/07/2015. Sleep efficiency was 72.9%, sleep latency 13 minutes, wake after sleep onset was 108 minutes with mild to moderate sleep fragmentation noted. He had an elevated arousal index, he had an increased percentage of stage II sleep, absence of slow-wave sleep and an increased percentage of REM sleep at 26%, REM latency prolonged at 166.5 minutes. He had severe PLMS with an index of 68 per hour, arousal index from PLMS was 16.5 per hour. He had frequent PACs on EKG. Average oxygen saturation was 88% but improved to about 91% on the final CPAP pressure. CPAP was titrated from 5 cm to 10 cm. AHI was 0 per hour on the pressure of 10 cm. Based on his test results are prescribed CPAP therapy for home use.    I reviewed his CPAP compliance data from 08/09/2015 through 09/07/2015 which is a total of 30 days during which time he used his  machine every night with percent used days greater than 4 hours at 100%, indicating superb compliance with an average usage of 7 hours  and 47 minutes, residual AHI 3.2 per hour, leak at times high with the 95th percentile at 29.5 L/m on a pressure of 10 cm with EPR of 3.    05/26/2015: He reports snoring and excessive daytime somnolence. He denies a FHx of OSA. His Epworth sleepiness score is 2 out of 24 today, his fatigue score is 2 out of 63 today. He reports having had a sleep study several years ago may be 10+ years ago and per his report it was borderline at the time. He has irregular breathing and brief pauses, per wife's report, and snorting sounds per son's feedback.   He denies morning headaches, but has nocturia 2-3 times per night. He has started having some L knee discomfort but denies RLS symptoms.   He tries to exercise at the [facility] and plays Golf about 3 days a week.   He drinks caffeine in the form of coffee, about 2 cups a day. He drinks about 2 mixed drinks per day, he is a nonsmoker. He retired from [employer] after 40 years. He has 3 children, 7 grandchildren. He lives with his wife. He does not have trouble falling asleep or staying asleep. I reviewed your office note from 10/22/2014, which you kindly included.   His Past Medical History Is Significant For: Past Medical History:  Diagnosis Date   Allergy    Arthritis    Cancer (HCC)    prostate CA 1997, skin CA   Cataract    Colon polyp    COVID 2021   mild   Dysrhythmia    PACs   History of colon polyps    adenoma;[facility]   History of prostate cancer    History of skin cancer    HTN (hypertension)    Neuromuscular disorder (HCC)    sciatic   Other and unspecified hyperlipidemia    PAC (premature atrial contraction)    Sleep apnea    uses CPAP    His Past Surgical History Is Significant For: Past Surgical History:  Procedure Laterality Date   COLONOSCOPY     COLONOSCOPY W/ POLYPECTOMY   last 2013    X 2; [facility]   EYE SURGERY Bilateral 2021   HEMORROIDECTOMY     INCONTINENCE SURGERY     INSERTION OF MESH N/A 05/04/2020   Procedure: INSERTION OF MESH;  Surgeon: Abigail Miyamoto, MD;  Location: [facility];  Service: General;  Laterality: N/A;   LAMINECTOMY WITH POSTERIOR LATERAL ARTHRODESIS LEVEL 2 Bilateral 04/22/2022   Procedure: Laminectomy and Foraminotomy - Lumbar Three-Lumbar Four, Lumbar Four-Lumbar Five - bilateral, instrumented fusion Lumbar Three-Lumbar Five;  Surgeon: Tia Alert, MD;  Location[facility];  Service: Neurosurgery;  Laterality: Bilateral;   LAMINECTOMY WITH POSTERIOR LATERAL ARTHRODESIS LEVEL 2 Left 11/14/2022   Procedure: Exploration and Extension of Fusion, left hemifacetectomy and diskectomy L3-4, instrumented fusion L3-5;  Surgeon: Arman Bogus, MD;  Location: [facility] OR;  Service: Neurosurgery;  Laterality: Left;   PROSTATE SURGERY     PROSTATECTOMY  1997   Dr Earlene Plater   ROTATOR CUFF REPAIR      X 3(L twice); Dr Thomasena Edis   TOTAL KNEE ARTHROPLASTY     L; Dr Despina Hick   TOTAL KNEE REVISION Left 07/29/2020   Procedure: LEFT KNEE REVISION, POLYETHYLENE EXCHANGE;  Surgeon: Ollen Gross, MD;  Location: [facility];  Service: Orthopedics;  Laterality: Left;   UMBILICAL HERNIA REPAIR N/A 05/04/2020   Procedure: UMBILICAL HERNIA REPAIR WITH MESH;  Surgeon: Abigail Miyamoto, MD;  Location: [facility];  Service: General;  Laterality: N/A;  LMA    His Family History Is Significant For: Family History  Problem Relation Age of Onset   Stroke Mother 55   Heart disease Father 4       ?MI   Heart disease Brother        CBAG in 35s   Dementia Brother        vascular dementia   Colon cancer Neg Hx    Stomach cancer Neg Hx    Diabetes Neg Hx    Cancer Neg Hx    Esophageal cancer Neg Hx    Rectal cancer Neg Hx    Sleep apnea Neg Hx     His Social History Is Significant For: Social History   Socioeconomic History    Marital status: Married    Spouse name: Not on file   Number of children: 3   Years of education: BA   Highest education level: Not on file  Occupational History   Occupation: Retired  Tobacco Use   Smoking status: Never   Smokeless tobacco: Never  Vaping Use   Vaping status: Never Used  Substance and Sexual Activity   Alcohol use: Yes    Alcohol/week: 14.0 standard drinks of alcohol    Types: 14 Shots of liquor per week    Comment:  14 drinks /week   Drug use: No   Sexual activity: Not on file  Other Topics Concern   Not on file  Social History Narrative   Drinks 2 cups of coffee a day    Social Drivers of Corporate investment banker Strain: Not on file  Food Insecurity: No Food Insecurity (04/22/2022)   Hunger Vital Sign    Worried About Running Out of Food in the Last Year: Never true    Ran Out of Food in the Last Year: Never true  Transportation Needs: No Transportation Needs (04/22/2022)   PRAPARE - Administrator, Civil Service (Medical): No    Lack of Transportation (Non-Medical): No  Physical Activity: Not on file  Stress: Not on file  Social Connections: Not on file    His Allergies Are:  Allergies  Allergen Reactions   Cefdinir Nausea Only    Per PCP, causes upset stomach   Neo-Bacit-Poly-Lidocaine Rash   Neosporin + Pain Relief Max St [Neomy-Bacit-Polymyx-Pramoxine] Rash  :   His Current Medications Are:  Outpatient Encounter Medications as of 05/23/2023  Medication Sig   acetaminophen (TYLENOL) 500 MG tablet Take 500-1,000 mg by mouth every 6 (six) hours as needed for moderate pain.   acetaminophen (TYLENOL) 650 MG CR tablet Take 1,300 mg by mouth every 8 (eight) hours as needed for pain.   amLODipine (NORVASC) 10 MG tablet Take 1 tablet (10 mg total) by mouth daily.   atorvastatin (LIPITOR) 20 MG tablet TAKE ONE AND ONE-HALF TABLETS DAILY   benazepril (LOTENSIN) 40 MG tablet Take 1 tablet (40 mg total) by mouth daily.   cetirizine  (ZYRTEC) 10 MG tablet Take 10 mg by mouth daily.   fluticasone (FLONASE) 50 MCG/ACT nasal spray Place 1 spray into both nostrils daily as needed for allergies or rhinitis.   ketoconazole (NIZORAL) 2 % cream Apply 1 Application topically 2 (two) times daily as needed for irritation.   Melatonin 10 MG TABS Take 10 mg by mouth at bedtime as needed (sleep).   meloxicam (MOBIC) 15 MG tablet Take 15 mg  by mouth daily as needed for pain.   methocarbamol (ROBAXIN) 500 MG tablet Take 1 tablet (500 mg total) by mouth every 6 (six) hours as needed for muscle spasms.   metoprolol (LOPRESSOR) 50 MG tablet 1/2 by mouth two times daily (Patient taking differently: Take 25 mg by mouth 2 (two) times daily.)   Multiple Vitamin (MULTIVITAMIN) tablet Take 1 tablet by mouth daily.   HYDROcodone-acetaminophen (NORCO/VICODIN) 5-325 MG tablet Take 1 tablet by mouth every 6 (six) hours. (Patient not taking: Reported on 05/23/2023)   No facility-administered encounter medications on file as of 05/23/2023.  :  Review of Systems:  Out of a complete 14 point review of systems, all are reviewed and negative with the exception of these symptoms as listed below:  Review of Systems  Neurological:        Patient in room #9 and alone. Patient states he has numbness in his feet. ESS score 1 and FSS score 21    Objective:  Neurological Exam  Physical Exam Physical Examination:   Vitals:   05/23/23 1011  BP: 135/76  Pulse: 62    General Examination: The patient is a very pleasant 89 y.o. male in no acute distress. He appears well-developed and well-nourished and well groomed.   HEENT: Normocephalic, atraumatic, pupils are equal, round and reactive to light, corrective eyeglasses, hearing is grossly intact. Face is symmetric, normal facial animation, and normal facial sensation to light touch, temperature and pinprick sensation. Speech is clear, no hypophonia, no tremor, neck is supple. Airway examination reveals no  change, mild mouth dryness.  Tongue protrudes centrally and palate elevates symmetrically.   Chest: Clear to auscultation without wheezing, rhonchi or crackles noted.   Heart: S1+S2+0, regular and normal without murmurs, rubs or gallops noted.    Abdomen: Soft, non-tender and non-distended.   Extremities: There is no pitting edema in the distal lower extremities bilaterally.  Good pedal pulses, skin warm.   Skin: Warm and dry without trophic changes noted. There are mild varicose veins.  No other discoloration of his feet or hands.   Musculoskeletal: exam reveals mildly stooped posture.   Neurologically:  Mental status: The patient is awake, alert and oriented in all 4 spheres. His immediate and remote memory, attention, language skills and fund of knowledge are appropriate. There is no evidence of aphasia, agnosia, apraxia or anomia. Speech is clear with normal prosody and enunciation. Thought process is linear. Mood is normal and affect is normal.  Cranial nerves II - XII are as described above under HEENT exam.  Motor exam: Normal bulk, strength and tone is noted. There is no drift, tremor or rebound. Fine motor skills and coordination: Grossly intact in the upper and lower extremities. Reflexes are 1+ in the upper extremities and absent in the lower extremities, toes are equivocal bilaterally. Cerebellar testing: No dysmetria or intention tremor.  No truncal or gait ataxia.  Sensory exam: Decreased sensation to all modalities including pinprick and temperature and vibration sense in the distal upper extremities in the fingers and hands up to wrist area and in the lower extremities up to lower one third of shin area bilaterally.   Gait, station and balance: He stands with mild difficulty and pushes himself up.  He requires no assistance.  He walks slowly and cautiously, slightly stooped posture, could be appropriate for age.  He walks slightly wide-based, Romberg not tested due to safety  concerns and tandem walk not tested due to safety concerns.  Assessment and Plan:  In summary, ASCENCION COYE is an 89 year old male with an underlying medical history of prostate cancer, status post prostatectomy, arthritis with status post left total knee arthroplasty, lumbar spondylosis with status post lumbar laminectomy in January 2024, status post T12 compression fracture, allergies, cataracts with status post surgeries, hypertension, PACs, hyperlipidemia and sleep apnea, who presents for an of his numbness affecting both feet for the past at least 6 months, probably longer.  History and examination show decreased sensation to all modalities in the distal lower extremities as well as distal upper extremities bilaterally, fairly symmetrical and diminished reflexes in the lower extremities.  History and examination are in keeping with neuropathy.  He had electrophysiological testing through your office which was also supportive of a neuropathy.  We may repeat his EMG/NCV test through our office in the future.  For now, we will investigate for causes for neuropathy.  I had a very long discussion with the patient today regarding causes and treatment options for neuropathy.  He is currently not bothered by any pain and symptomatic treatment for numbness is essentially not available.  He is advised to scale back on his daily alcohol consumption and increase his water intake to hydrate better.  He is advised that alcohol can be a toxin for nerve endings and besides diabetes it is a common cause for neuropathy.  He is receptive to this.  We will do a extensive blood work today to look for any other treatable causes.  We will call him with his test results.  He is compliant with sleep apnea treatment with his CPAP with adequate apnea control and tolerance of treatment.  He is commended for his treatment adherence.   He is advised to continue with physical therapy and use a cane or walker as needed for gait  safety and stability.  I am not sure that he is completely safe to drive as he has had trouble feeling the gas and brake pedal and I voiced my concern with him explicitly today.  He is encouraged to talk to you and his PCP about this as well.   He is advised to follow-up to see one of our nurse practitioners routinely.  In the meantime, we will keep him posted as to his blood test results and follow-up accordingly as needed.  I answered all his questions today and he was in agreement with our approach.    Thank you very much for allowing me to participate in the care of this nice patient. If I can be of any further assistance to you please do not hesitate to call me at [phone].  Sincerely,   Huston Foley, MD, PhD  I spent 65 minutes in total face-to-face time and in reviewing records during pre-charting, more than 50% of which was spent in counseling and coordination of care, reviewing test results, reviewing medications and treatment regimen and/or in discussing or reviewing the diagnosis of neuropathy, the prognosis and treatment options. Pertinent laboratory and imaging test results that were available during this visit with the patient were reviewed by me and considered in my medical decision making (see chart for details).

## 2023-05-28 LAB — CBC WITH DIFFERENTIAL/PLATELET
Basophils Absolute: 0.1 10*3/uL (ref 0.0–0.2)
Basos: 1 %
EOS (ABSOLUTE): 0.2 10*3/uL (ref 0.0–0.4)
Eos: 4 %
Hematocrit: 42.9 % (ref 37.5–51.0)
Hemoglobin: 14.5 g/dL (ref 13.0–17.7)
Immature Grans (Abs): 0 10*3/uL (ref 0.0–0.1)
Immature Granulocytes: 0 %
Lymphocytes Absolute: 1.6 10*3/uL (ref 0.7–3.1)
Lymphs: 27 %
MCH: 30.7 pg (ref 26.6–33.0)
MCHC: 33.8 g/dL (ref 31.5–35.7)
MCV: 91 fL (ref 79–97)
Monocytes Absolute: 0.4 10*3/uL (ref 0.1–0.9)
Monocytes: 7 %
Neutrophils Absolute: 3.7 10*3/uL (ref 1.4–7.0)
Neutrophils: 61 %
Platelets: 241 10*3/uL (ref 150–450)
RBC: 4.73 x10E6/uL (ref 4.14–5.80)
RDW: 13.9 % (ref 11.6–15.4)
WBC: 6 10*3/uL (ref 3.4–10.8)

## 2023-05-28 LAB — SEDIMENTATION RATE: Sed Rate: 2 mm/h (ref 0–30)

## 2023-05-28 LAB — RHEUMATOID FACTOR: Rheumatoid fact SerPl-aCnc: 13.3 [IU]/mL (ref <14.0)

## 2023-05-28 LAB — MULTIPLE MYELOMA PANEL, SERUM
Albumin SerPl Elph-Mcnc: 3.9 g/dL (ref 2.9–4.4)
Albumin/Glob SerPl: 1.6 (ref 0.7–1.7)
Alpha 1: 0.2 g/dL (ref 0.0–0.4)
Alpha2 Glob SerPl Elph-Mcnc: 0.7 g/dL (ref 0.4–1.0)
B-Globulin SerPl Elph-Mcnc: 0.9 g/dL (ref 0.7–1.3)
Gamma Glob SerPl Elph-Mcnc: 0.8 g/dL (ref 0.4–1.8)
Globulin, Total: 2.6 g/dL (ref 2.2–3.9)
IgA/Immunoglobulin A, Serum: 315 mg/dL (ref 61–437)
IgG (Immunoglobin G), Serum: 789 mg/dL (ref 603–1613)
IgM (Immunoglobulin M), Srm: 62 mg/dL (ref 15–143)
Total Protein: 6.5 g/dL (ref 6.0–8.5)

## 2023-05-28 LAB — TSH: TSH: 1.82 u[IU]/mL (ref 0.450–4.500)

## 2023-05-28 LAB — ANA W/REFLEX: Anti Nuclear Antibody (ANA): NEGATIVE

## 2023-05-28 LAB — B12 AND FOLATE PANEL
Folate: >20 ng/mL (ref >3.0)
Vitamin B-12: 429 pg/mL (ref 232–1245)

## 2023-05-28 LAB — HEAVY METALS PROFILE II, BLOOD
Arsenic: 2 ug/L (ref 0–9)
Cadmium: <0.5 ug/L (ref 0.0–1.2)
Lead, Blood: 1.3 ug/dL (ref 0.0–3.4)
Mercury: <1 ug/L (ref 0.0–14.9)

## 2023-05-28 LAB — CK: Total CK: 129 U/L (ref 30–208)

## 2023-05-28 LAB — VITAMIN B1: Thiamine: 120.8 nmol/L (ref 66.5–200.0)

## 2023-05-28 LAB — C-REACTIVE PROTEIN: CRP: <1 mg/L (ref 0–10)

## 2023-05-28 LAB — VITAMIN D 25 HYDROXY (VIT D DEFICIENCY, FRACTURES): Vit D, 25-Hydroxy: 32.5 ng/mL (ref 30.0–100.0)

## 2023-05-28 LAB — VITAMIN B6: Vitamin B6: 63.1 ug/L (ref 3.4–65.2)

## 2023-05-28 LAB — HGB A1C W/O EAG: Hgb A1c MFr Bld: 5.9 % — ABNORMAL HIGH (ref 4.8–5.6)

## 2023-05-30 ENCOUNTER — Telehealth: Payer: Self-pay | Admitting: *Deleted

## 2023-05-30 NOTE — Telephone Encounter (Signed)
 Spoke to patient gave lab work results Pt asked for [place] to send results to PCP and mail results to his house . Pt states will call PCP to see if sooner appointment is needed sent pcp labwork results and sent request to medical records for labwork be mailed to his home Pt thanked me for calling

## 2023-05-30 NOTE — Telephone Encounter (Signed)
-----   Message from Huston Foley sent at 05/26/2023  9:09 AM EST ----- Please call patient regarding his labs:  His hemoglobin A1c which is a marker for diabetes and prediabetes is indeed in the prediabetes range.  His vitamin D is in the normal range but on the very low end of the range.  Autoimmune markers were unremarkable, inflammatory markers unremarkable, heavy metal unremarkable, muscle enzyme unremarkable, unremarkable protein and antibody distribution and his blood, normal vitamin B1 level, normal thyroid screening test.  Vitamin B12 normal but on the lower side of the normal range.  I recommend he touch base with his PCP regarding prediabetes and potentially starting over-the-counter vitamin D, nothing alarming but worth bouncing off of PCP.  1 test is pending which is the vitamin B6 level, we will update if abnormal.

## 2023-07-31 ENCOUNTER — Telehealth: Payer: Self-pay | Admitting: Neurology

## 2023-07-31 NOTE — Telephone Encounter (Signed)
 Pt called wanting to speak to the nurse and see if she can suggest another way to clean his cpap machine. I asked if pt called DME he stated no and that he wanted to talk to the nurse. Please advise.

## 2023-08-01 ENCOUNTER — Ambulatory Visit: Payer: Medicare Other | Admitting: Adult Health

## 2023-08-01 NOTE — Telephone Encounter (Signed)
 I called pt and I could not tell him what is used for cleaning of machine (cpap), recommended to call [facility] and ask them, what they recommend.  I was happy to ask if provider is aware of what is recommended, but he said he would call.

## 2023-08-04 ENCOUNTER — Ambulatory Visit: Payer: Medicare Other | Admitting: Neurology

## 2023-12-11 ENCOUNTER — Ambulatory Visit: Payer: Medicare Other | Admitting: Adult Health

## 2023-12-11 VITALS — BP 136/86 | HR 64 | Ht 70.0 in | Wt 173.0 lb

## 2023-12-11 DIAGNOSIS — G629 Polyneuropathy, unspecified: Secondary | ICD-10-CM | POA: Diagnosis not present

## 2023-12-11 DIAGNOSIS — G4733 Obstructive sleep apnea (adult) (pediatric): Secondary | ICD-10-CM

## 2023-12-11 NOTE — Progress Notes (Signed)
 PATIENT: [name] DOB: Sep 26, 1934  REASON FOR VISIT: follow up HISTORY FROM: patient PRIMARY NEUROLOGIST: Dr. Buck  Chief Complaint  Patient presents with   Follow-up    Rm 19, alone.  Neuropathy.  Still with numbness feet , fingers.  Still driving.  Not using mobility devices. ESS 3.      HISTORY OF PRESENT ILLNESS:  [name] is a 89 y.o. male with a history of obstructive sleep apnea on CPAP and neuropathy. Returns today for follow-up.  In regards to his CPAP he feels it has been working well.  He denies any new issues.  His neuropathy has remained relatively stable.  He denies any discomfort.  Reports that he has numbness in the ball of his foot on both feet.  Does not use an assistive device.  Reports that he did do physical therapy in the past and still does the exercises at home.  He did follow-up with his PCP regarding blood work.  He states that he has cut back his alcohol  intake.  Only consumes alcohol  2-3 times a week and reports that he drinks less.  He returns today for an evaluation.      08/01/22: [name] is a 89 y.o. male with a history of OSA on CPAP. Returns today for follow-up.  CPAP is working well for him.  He denies any new issues.  Download is below     07/30/21: [name] is an 89 year old male with a history of obstructive sleep apnea on CPAP.  He returns today for CPAP download after receiving a new machine.  His download is below.  Reports that he is using the nasal pillows.  Did not like the fullface.    Today 12/11/23:    05/19/21: [name] is a an 89 year old male with a history of obstructive sleep apnea on CPAP.  He returns today for follow-up.  He reports that the CPAP is working well for him.  He states that he is due for new machine and would like his replaced.  He returns today for evaluation.      REVIEW OF SYSTEMS: Out of a complete 14 system review of symptoms, the patient complains only of the following symptoms, and all  other reviewed systems are negative.   ESS 2 FSS 14   ALLERGIES: Allergies  Allergen Reactions   Cefdinir Nausea Only    Per PCP, causes upset stomach   Neo-Bacit-Poly-Lidocaine  Rash   Neosporin + Pain Relief Max St [Neomy-Bacit-Polymyx-Pramoxine] Rash    HOME MEDICATIONS: Outpatient Medications Prior to Visit  Medication Sig Dispense Refill   acetaminophen  (TYLENOL ) 500 MG tablet Take 500-1,000 mg by mouth every 6 (six) hours as needed for moderate pain.     acetaminophen  (TYLENOL ) 650 MG CR tablet Take 1,300 mg by mouth every 8 (eight) hours as needed for pain.     amLODipine  (NORVASC ) 10 MG tablet Take 1 tablet (10 mg total) by mouth daily. 90 tablet 3   atorvastatin  (LIPITOR) 20 MG tablet TAKE ONE AND ONE-HALF TABLETS DAILY 135 tablet 3   benazepril  (LOTENSIN ) 40 MG tablet Take 1 tablet (40 mg total) by mouth daily. 90 tablet 3   cetirizine (ZYRTEC) 10 MG tablet Take 10 mg by mouth daily.     fluticasone  (FLONASE) 50 MCG/ACT nasal spray Place 1 spray into both nostrils daily as needed for allergies or rhinitis.     ketoconazole (NIZORAL) 2 % cream Apply 1 Application topically 2 (two) times daily  as needed for irritation.     Melatonin 10 MG TABS Take 10 mg by mouth at bedtime as needed (sleep).     meloxicam (MOBIC) 15 MG tablet Take 15 mg by mouth daily as needed for pain.     methocarbamol  (ROBAXIN ) 500 MG tablet Take 1 tablet (500 mg total) by mouth every 6 (six) hours as needed for muscle spasms. 60 tablet 0   metoprolol  (LOPRESSOR ) 50 MG tablet 1/2 by mouth two times daily (Patient taking differently: Take 25 mg by mouth 2 (two) times daily.) 90 tablet 3   Multiple Vitamin (MULTIVITAMIN) tablet Take 1 tablet by mouth daily.     HYDROcodone -acetaminophen  (NORCO/VICODIN) 5-325 MG tablet Take 1 tablet by mouth every 6 (six) hours. (Patient not taking: Reported on 12/11/2023) 30 tablet 0   No facility-administered medications prior to visit.    PAST MEDICAL HISTORY: Past  Medical History:  Diagnosis Date   Allergy    Arthritis    Cancer (HCC)    prostate CA 1997, skin CA   Cataract    Colon polyp    COVID 2021   mild   Dysrhythmia    PACs   History of colon polyps    adenoma;[facility]   History of prostate cancer    History of skin cancer    HTN (hypertension)    Neuromuscular disorder (HCC)    sciatic   Other and unspecified hyperlipidemia    PAC (premature atrial contraction)    Sleep apnea    uses CPAP    PAST SURGICAL HISTORY: Past Surgical History:  Procedure Laterality Date   COLONOSCOPY     COLONOSCOPY W/ POLYPECTOMY  last 2013    X 2; [facility]   EYE SURGERY Bilateral 2021   HEMORROIDECTOMY     INCONTINENCE SURGERY     INSERTION OF MESH N/A 05/04/2020   Procedure: INSERTION OF MESH;  Surgeon: Vernetta Berg, MD;  Location: [facility];  Service: General;  Laterality: N/A;   LAMINECTOMY WITH POSTERIOR LATERAL ARTHRODESIS LEVEL 2 Bilateral 04/22/2022   Procedure: Laminectomy and Foraminotomy - Lumbar Three-Lumbar Four, Lumbar Four-Lumbar Five - bilateral, instrumented fusion Lumbar Three-Lumbar Five;  Surgeon: Joshua Alm RAMAN, MD;  Location: [facility];  Service: Neurosurgery;  Laterality: Bilateral;   LAMINECTOMY WITH POSTERIOR LATERAL ARTHRODESIS LEVEL 2 Left 11/14/2022   Procedure: Exploration and Extension of Fusion, left hemifacetectomy and diskectomy L3-4, instrumented fusion L3-5;  Surgeon: Joshua Alm Hamilton, MD;  Location: [facility] OR;  Service: Neurosurgery;  Laterality: Left;   PROSTATE SURGERY     PROSTATECTOMY  1997   Dr Nicholaus   ROTATOR CUFF REPAIR      X 3(L twice); Dr Gerome   TOTAL KNEE ARTHROPLASTY     L; Dr Hiram   TOTAL KNEE REVISION Left 07/29/2020   Procedure: LEFT KNEE REVISION, POLYETHYLENE EXCHANGE;  Surgeon: Melodi Lerner, MD;  Location: [facility];  Service: Orthopedics;  Laterality: Left;   UMBILICAL HERNIA REPAIR N/A 05/04/2020   Procedure: UMBILICAL HERNIA REPAIR WITH MESH;  Surgeon: Vernetta Berg, MD;  Location: [facility];  Service: General;  Laterality: N/A;  LMA    FAMILY HISTORY: Family History  Problem Relation Age of Onset   Stroke Mother 64   Heart disease Father 49       ?MI   Heart disease Brother        CBAG in 28s   Dementia Brother        vascular dementia  Colon cancer Neg Hx    Stomach cancer Neg Hx    Diabetes Neg Hx    Cancer Neg Hx    Esophageal cancer Neg Hx    Rectal cancer Neg Hx    Sleep apnea Neg Hx     SOCIAL HISTORY: Social History   Socioeconomic History   Marital status: Married    Spouse name: Not on file   Number of children: 3   Years of education: BA   Highest education level: Not on file  Occupational History   Occupation: Retired  Tobacco Use   Smoking status: Never   Smokeless tobacco: Never  Vaping Use   Vaping status: Never Used  Substance and Sexual Activity   Alcohol  use: Yes    Alcohol /week: 14.0 standard drinks of alcohol     Types: 14 Shots of liquor per week    Comment:  14 drinks /week   Drug use: No   Sexual activity: Not on file  Other Topics Concern   Not on file  Social History Narrative   Drinks 2 cups of coffee a day    Social Drivers of Corporate investment banker Strain: Not on file  Food Insecurity: No Food Insecurity (04/22/2022)   Hunger Vital Sign    Worried About Running Out of Food in the Last Year: Never true    Ran Out of Food in the Last Year: Never true  Transportation Needs: No Transportation Needs (04/22/2022)   PRAPARE - Administrator, Civil Service (Medical): No    Lack of Transportation (Non-Medical): No  Physical Activity: Not on file  Stress: Not on file  Social Connections: Not on file  Intimate Partner Violence: Not At Risk (04/22/2022)   Humiliation, Afraid, Rape, and Kick questionnaire    Fear of Current or Ex-Partner: No    Emotionally Abused: No    Physically Abused: No    Sexually Abused: No      PHYSICAL EXAM  Vitals:    12/11/23 1020  BP: 136/86  Pulse: 64  Weight: 173 lb (78.5 kg)  Height: 5' 10 (1.778 m)   Body mass index is 24.82 kg/m.  Generalized: Well developed, in no acute distress  Chest: Lungs clear to auscultation bilaterally  Neurological examination  Mentation: Alert oriented to time, place, history taking. Follows all commands speech and language fluent Cranial nerve II-XII: Facial symmetry noted   DIAGNOSTIC DATA (LABS, IMAGING, TESTING) - I reviewed patient records, labs, notes, testing and imaging myself where available.  Lab Results  Component Value Date   WBC 6.0 05/23/2023   HGB 14.5 05/23/2023   HCT 42.9 05/23/2023   MCV 91 05/23/2023   PLT 241 05/23/2023      Component Value Date/Time   NA 140 11/03/2022 1004   K 3.9 11/03/2022 1004   CL 104 11/03/2022 1004   CO2 27 11/03/2022 1004   GLUCOSE 100 (H) 11/03/2022 1004   GLUCOSE 101 (H) 02/07/2006 0821   BUN 12 11/03/2022 1004   CREATININE 0.68 11/03/2022 1004   CALCIUM  9.1 11/03/2022 1004   PROT 6.5 05/23/2023 1111   ALBUMIN 4.1 04/05/2022 1101   AST 33 04/05/2022 1101   ALT 35 04/05/2022 1101   ALKPHOS 81 04/05/2022 1101   BILITOT 0.9 04/05/2022 1101   GFRNONAA >60 11/03/2022 1004   GFRAA  05/19/2010 0432    >60        The eGFR has been calculated using the MDRD equation. This calculation has not  been validated in all clinical situations. eGFR's persistently <60 mL/min signify possible Chronic Kidney Disease.   Lab Results  Component Value Date   CHOL 204 (H) 05/22/2013   HDL 74.10 05/22/2013   LDLCALC 114 (H) 05/11/2012   LDLDIRECT 113.8 05/22/2013   TRIG 77.0 05/22/2013   CHOLHDL 3 05/22/2013   Lab Results  Component Value Date   HGBA1C 5.9 (H) 05/23/2023   Lab Results  Component Value Date   VITAMINB12 429 05/23/2023   Lab Results  Component Value Date   TSH 1.820 05/23/2023      ASSESSMENT AND PLAN 89 y.o. year old male  has a past medical history of Allergy, Arthritis, Cancer  (HCC), Cataract, Colon polyp, COVID (2021), Dysrhythmia, History of colon polyps, History of prostate cancer, History of skin cancer, HTN (hypertension), Neuromuscular disorder (HCC), Other and unspecified hyperlipidemia, PAC (premature atrial contraction), and Sleep apnea. here with:  OSA on CPAP Neuropathy  - CPAP compliance excellent - Good treatment of AHI  - Encourage patient to use CPAP nightly and > 4 hours each night - Neuropathy is relatively stable - We discussed that if symptoms worsen we can consider repeating nerve conduction studies in the future. - Encouraged him to continue doing exercises learned at physical therapy at home.  Can place another referral to physical therapy in the future if needed. - F/U in 1 year or sooner if needed   Patrick Russell, MSN, NP-C 12/11/2023, 10:26 AM [facility] [address], [address] [place], [place] [address] [phone]  The patient's condition requires frequent monitoring and adjustments in the treatment plan, reflecting the ongoing complexity of care.  This provider is the continuing focal point for all needed services for this condition.

## 2023-12-11 NOTE — Patient Instructions (Addendum)
 Continue using CPAP nightly and greater than 4 hours each night Continue doing the physical therapy exercises at home Certainly if your symptoms worsen we can consider repeating nerve conduction studies in the future If your symptoms worsen or you develop new symptoms please let us  know.

## 2024-02-16 ENCOUNTER — Other Ambulatory Visit (HOSPITAL_COMMUNITY): Payer: Self-pay

## 2024-04-09 ENCOUNTER — Telehealth: Payer: Self-pay | Admitting: Adult Health

## 2024-04-09 NOTE — Telephone Encounter (Signed)
 Spoke to patient made him aware ok to order cpap supplies Pt thanked me for calling

## 2024-04-09 NOTE — Telephone Encounter (Signed)
 Patient want to know if everything ok to order CPAP supplies and if need to do anything. Would like a call back.

## 2024-10-09 ENCOUNTER — Ambulatory Visit: Admitting: Adult Health
